# Patient Record
Sex: Male | Born: 1950 | Race: White | Hispanic: No | Marital: Married | Smoking: Never smoker
Health system: Southern US, Community
[De-identification: ages and names within clinical notes are randomized; demographics above are authoritative.]

## PROBLEM LIST (undated history)

## (undated) DIAGNOSIS — K8309 Other cholangitis: Secondary | ICD-10-CM

## (undated) DIAGNOSIS — M359 Systemic involvement of connective tissue, unspecified: Secondary | ICD-10-CM

## (undated) DIAGNOSIS — K219 Gastro-esophageal reflux disease without esophagitis: Secondary | ICD-10-CM

## (undated) DIAGNOSIS — R600 Localized edema: Secondary | ICD-10-CM

## (undated) DIAGNOSIS — N183 Chronic kidney disease, stage 3 unspecified: Secondary | ICD-10-CM

## (undated) DIAGNOSIS — G4733 Obstructive sleep apnea (adult) (pediatric): Secondary | ICD-10-CM

## (undated) DIAGNOSIS — E119 Type 2 diabetes mellitus without complications: Secondary | ICD-10-CM

## (undated) DIAGNOSIS — D649 Anemia, unspecified: Secondary | ICD-10-CM

## (undated) DIAGNOSIS — I700 Atherosclerosis of aorta: Secondary | ICD-10-CM

## (undated) DIAGNOSIS — R42 Dizziness and giddiness: Secondary | ICD-10-CM

## (undated) DIAGNOSIS — G459 Transient cerebral ischemic attack, unspecified: Secondary | ICD-10-CM

## (undated) DIAGNOSIS — E785 Hyperlipidemia, unspecified: Secondary | ICD-10-CM

## (undated) DIAGNOSIS — M1990 Unspecified osteoarthritis, unspecified site: Secondary | ICD-10-CM

## (undated) DIAGNOSIS — M109 Gout, unspecified: Secondary | ICD-10-CM

## (undated) DIAGNOSIS — I219 Acute myocardial infarction, unspecified: Secondary | ICD-10-CM

## (undated) DIAGNOSIS — Z8639 Personal history of other endocrine, nutritional and metabolic disease: Secondary | ICD-10-CM

## (undated) DIAGNOSIS — I2510 Atherosclerotic heart disease of native coronary artery without angina pectoris: Secondary | ICD-10-CM

## (undated) DIAGNOSIS — H9190 Unspecified hearing loss, unspecified ear: Secondary | ICD-10-CM

## (undated) DIAGNOSIS — K222 Esophageal obstruction: Secondary | ICD-10-CM

## (undated) DIAGNOSIS — E039 Hypothyroidism, unspecified: Secondary | ICD-10-CM

## (undated) DIAGNOSIS — I5030 Unspecified diastolic (congestive) heart failure: Secondary | ICD-10-CM

## (undated) DIAGNOSIS — I10 Essential (primary) hypertension: Secondary | ICD-10-CM

## (undated) DIAGNOSIS — R918 Other nonspecific abnormal finding of lung field: Secondary | ICD-10-CM

## (undated) DIAGNOSIS — R160 Hepatomegaly, not elsewhere classified: Secondary | ICD-10-CM

## (undated) DIAGNOSIS — R609 Edema, unspecified: Secondary | ICD-10-CM

## (undated) DIAGNOSIS — R0609 Other forms of dyspnea: Secondary | ICD-10-CM

## (undated) DIAGNOSIS — M719 Bursopathy, unspecified: Secondary | ICD-10-CM

## (undated) DIAGNOSIS — M4316 Spondylolisthesis, lumbar region: Secondary | ICD-10-CM

## (undated) HISTORY — DX: Essential (primary) hypertension: I10

## (undated) HISTORY — PX: ANKLE FRACTURE SURGERY: SHX122

## (undated) HISTORY — PX: BACK SURGERY: SHX140

## (undated) HISTORY — PX: TOTAL THYROIDECTOMY: SHX2547

## (undated) HISTORY — DX: Hyperlipidemia, unspecified: E78.5

## (undated) HISTORY — PX: FRACTURE SURGERY: SHX138

## (undated) HISTORY — PX: CARPAL TUNNEL RELEASE: SHX101

## (undated) HISTORY — DX: Bursopathy, unspecified: M71.9

## (undated) HISTORY — DX: Unspecified osteoarthritis, unspecified site: M19.90

## (undated) HISTORY — DX: Type 2 diabetes mellitus without complications: E11.9

## (undated) HISTORY — PX: COLONOSCOPY WITH ESOPHAGOGASTRODUODENOSCOPY (EGD): SHX5779

## (undated) HISTORY — DX: Acute myocardial infarction, unspecified: I21.9

## (undated) HISTORY — DX: Other cholangitis: K83.09

---

## 2000-02-09 DIAGNOSIS — K8301 Primary sclerosing cholangitis: Secondary | ICD-10-CM

## 2000-02-09 HISTORY — DX: Primary sclerosing cholangitis: K83.01

## 2011-09-02 ENCOUNTER — Ambulatory Visit: Payer: Self-pay | Admitting: Internal Medicine

## 2011-09-09 ENCOUNTER — Ambulatory Visit: Payer: Self-pay | Admitting: Internal Medicine

## 2011-10-10 ENCOUNTER — Ambulatory Visit: Payer: Self-pay | Admitting: Internal Medicine

## 2012-09-11 ENCOUNTER — Ambulatory Visit: Payer: Self-pay | Admitting: Internal Medicine

## 2012-09-20 ENCOUNTER — Ambulatory Visit: Payer: Self-pay | Admitting: Gastroenterology

## 2012-10-12 ENCOUNTER — Ambulatory Visit: Payer: Self-pay | Admitting: Gastroenterology

## 2012-10-24 ENCOUNTER — Inpatient Hospital Stay: Payer: Self-pay | Admitting: Internal Medicine

## 2012-10-24 LAB — COMPREHENSIVE METABOLIC PANEL
Albumin: 3.3 g/dL — ABNORMAL LOW (ref 3.4–5.0)
Alkaline Phosphatase: 95 U/L (ref 50–136)
Anion Gap: 9 (ref 7–16)
BUN: 13 mg/dL (ref 7–18)
Bilirubin,Total: 1.2 mg/dL — ABNORMAL HIGH (ref 0.2–1.0)
Calcium, Total: 9.8 mg/dL (ref 8.5–10.1)
Chloride: 104 mmol/L (ref 98–107)
Co2: 24 mmol/L (ref 21–32)
Creatinine: 1.24 mg/dL (ref 0.60–1.30)
EGFR (African American): >60
EGFR (Non-African Amer.): >60
SGOT(AST): 54 U/L — ABNORMAL HIGH (ref 15–37)
SGPT (ALT): 60 U/L (ref 12–78)
Sodium: 137 mmol/L (ref 136–145)
Total Protein: 6.3 g/dL — ABNORMAL LOW (ref 6.4–8.2)

## 2012-10-24 LAB — CBC WITH DIFFERENTIAL/PLATELET
Eosinophil #: 0 10*3/uL (ref 0.0–0.7)
HCT: 36.8 % — ABNORMAL LOW (ref 40.0–52.0)
HGB: 12.5 g/dL — ABNORMAL LOW (ref 13.0–18.0)
Lymphocyte %: 3.5 %
MCHC: 34 g/dL (ref 32.0–36.0)
Monocyte #: 0.3 x10 3/mm (ref 0.2–1.0)
Monocyte %: 2.7 %
Neutrophil #: 10.9 10*3/uL — ABNORMAL HIGH (ref 1.4–6.5)
Neutrophil %: 93.4 %
RDW: 13.5 % (ref 11.5–14.5)

## 2012-10-24 LAB — URINALYSIS, COMPLETE
Blood: NEGATIVE
Glucose,UR: NEGATIVE mg/dL (ref 0–75)
Ketone: NEGATIVE
Ph: 6 (ref 4.5–8.0)
Protein: 25
Specific Gravity: 1.005 (ref 1.003–1.030)
Squamous Epithelial: <1

## 2012-10-25 DIAGNOSIS — R109 Unspecified abdominal pain: Secondary | ICD-10-CM

## 2012-10-25 LAB — COMPREHENSIVE METABOLIC PANEL
Albumin: 2.6 g/dL — ABNORMAL LOW (ref 3.4–5.0)
Alkaline Phosphatase: 75 U/L (ref 50–136)
Anion Gap: 6 — ABNORMAL LOW (ref 7–16)
BUN: 17 mg/dL (ref 7–18)
Bilirubin,Total: 0.6 mg/dL (ref 0.2–1.0)
Calcium, Total: 8.2 mg/dL — ABNORMAL LOW (ref 8.5–10.1)
Co2: 26 mmol/L (ref 21–32)
EGFR (African American): >60
Glucose: 128 mg/dL — ABNORMAL HIGH (ref 65–99)
Osmolality: 284 (ref 275–301)
Potassium: 3.8 mmol/L (ref 3.5–5.1)
SGOT(AST): 45 U/L — ABNORMAL HIGH (ref 15–37)
SGPT (ALT): 48 U/L (ref 12–78)
Total Protein: 5.3 g/dL — ABNORMAL LOW (ref 6.4–8.2)

## 2012-10-25 LAB — CBC WITH DIFFERENTIAL/PLATELET
Basophil #: 0 10*3/uL (ref 0.0–0.1)
Eosinophil #: 0 10*3/uL (ref 0.0–0.7)
Eosinophil %: 0.3 %
HCT: 31.8 % — ABNORMAL LOW (ref 40.0–52.0)
Lymphocyte #: 1.4 10*3/uL (ref 1.0–3.6)
Lymphocyte %: 11.8 %
MCH: 29.8 pg (ref 26.0–34.0)
MCV: 89 fL (ref 80–100)
Monocyte #: 1.1 x10 3/mm — ABNORMAL HIGH (ref 0.2–1.0)
Monocyte %: 9.8 %
Neutrophil #: 8.9 10*3/uL — ABNORMAL HIGH (ref 1.4–6.5)
Neutrophil %: 77.8 %
Platelet: 143 10*3/uL — ABNORMAL LOW (ref 150–440)
RDW: 13.6 % (ref 11.5–14.5)
WBC: 11.4 10*3/uL — ABNORMAL HIGH (ref 3.8–10.6)

## 2012-10-25 LAB — APTT: Activated PTT: 33.1 secs (ref 23.6–35.9)

## 2012-10-26 LAB — CBC WITH DIFFERENTIAL/PLATELET
Basophil #: 0 10*3/uL (ref 0.0–0.1)
Eosinophil %: 0.3 %
HCT: 31.8 % — ABNORMAL LOW (ref 40.0–52.0)
HGB: 10.7 g/dL — ABNORMAL LOW (ref 13.0–18.0)
Neutrophil #: 5.8 10*3/uL (ref 1.4–6.5)
Platelet: 116 10*3/uL — ABNORMAL LOW (ref 150–440)
RDW: 13.2 % (ref 11.5–14.5)
WBC: 6.8 10*3/uL (ref 3.8–10.6)

## 2012-10-26 LAB — COMPREHENSIVE METABOLIC PANEL
Albumin: 2.3 g/dL — ABNORMAL LOW (ref 3.4–5.0)
Alkaline Phosphatase: 93 U/L (ref 50–136)
BUN: 20 mg/dL — ABNORMAL HIGH (ref 7–18)
Calcium, Total: 8.4 mg/dL — ABNORMAL LOW (ref 8.5–10.1)
Chloride: 109 mmol/L — ABNORMAL HIGH (ref 98–107)
Creatinine: 1.35 mg/dL — ABNORMAL HIGH (ref 0.60–1.30)
EGFR (African American): >60
EGFR (Non-African Amer.): 55 — ABNORMAL LOW
Glucose: 129 mg/dL — ABNORMAL HIGH (ref 65–99)
Osmolality: 284 (ref 275–301)
Potassium: 3.9 mmol/L (ref 3.5–5.1)
SGOT(AST): 94 U/L — ABNORMAL HIGH (ref 15–37)
SGPT (ALT): 90 U/L — ABNORMAL HIGH (ref 12–78)
Sodium: 140 mmol/L (ref 136–145)
Total Protein: 5 g/dL — ABNORMAL LOW (ref 6.4–8.2)

## 2012-10-26 LAB — CULTURE, BLOOD (SINGLE)

## 2012-10-27 DIAGNOSIS — R000 Tachycardia, unspecified: Secondary | ICD-10-CM

## 2012-10-27 LAB — CBC WITH DIFFERENTIAL/PLATELET
Basophil #: 0 10*3/uL (ref 0.0–0.1)
Basophil %: 0.2 %
Eosinophil #: 0 10*3/uL (ref 0.0–0.7)
Eosinophil %: 0 %
HCT: 32.1 % — ABNORMAL LOW (ref 40.0–52.0)
HGB: 10.8 g/dL — ABNORMAL LOW (ref 13.0–18.0)
Lymphocyte #: 0.2 10*3/uL — ABNORMAL LOW (ref 1.0–3.6)
MCHC: 33.7 g/dL (ref 32.0–36.0)
MCV: 88 fL (ref 80–100)
Monocyte %: 8.5 %
Neutrophil %: 87.9 %
RDW: 13.4 % (ref 11.5–14.5)

## 2012-10-27 LAB — COMPREHENSIVE METABOLIC PANEL
Alkaline Phosphatase: 108 U/L (ref 50–136)
Anion Gap: 8 (ref 7–16)
BUN: 16 mg/dL (ref 7–18)
Bilirubin,Total: 1.7 mg/dL — ABNORMAL HIGH (ref 0.2–1.0)
Chloride: 108 mmol/L — ABNORMAL HIGH (ref 98–107)
Co2: 24 mmol/L (ref 21–32)
EGFR (African American): >60
Glucose: 119 mg/dL — ABNORMAL HIGH (ref 65–99)
Potassium: 3.7 mmol/L (ref 3.5–5.1)
SGPT (ALT): 73 U/L (ref 12–78)
Sodium: 140 mmol/L (ref 136–145)
Total Protein: 5.4 g/dL — ABNORMAL LOW (ref 6.4–8.2)

## 2012-10-28 LAB — CBC WITH DIFFERENTIAL/PLATELET
Basophil #: 0 10*3/uL (ref 0.0–0.1)
Eosinophil #: 0.1 10*3/uL (ref 0.0–0.7)
Eosinophil %: 1.4 %
HGB: 9.9 g/dL — ABNORMAL LOW (ref 13.0–18.0)
Lymphocyte #: 0.6 10*3/uL — ABNORMAL LOW (ref 1.0–3.6)
Lymphocyte %: 9.3 %
MCH: 29.7 pg (ref 26.0–34.0)
Monocyte #: 0.8 x10 3/mm (ref 0.2–1.0)
Monocyte %: 13 %
Neutrophil %: 76 %
Platelet: 102 10*3/uL — ABNORMAL LOW (ref 150–440)
RDW: 13.5 % (ref 11.5–14.5)

## 2012-10-28 LAB — URINALYSIS, COMPLETE
Bacteria: NONE SEEN
Glucose,UR: 50 mg/dL (ref 0–75)
Nitrite: NEGATIVE
Ph: 6 (ref 4.5–8.0)
Protein: 30
Specific Gravity: 1.023 (ref 1.003–1.030)
Squamous Epithelial: NONE SEEN

## 2012-10-28 LAB — COMPREHENSIVE METABOLIC PANEL
Alkaline Phosphatase: 80 U/L (ref 50–136)
Anion Gap: 6 — ABNORMAL LOW (ref 7–16)
Bilirubin,Total: 1.4 mg/dL — ABNORMAL HIGH (ref 0.2–1.0)
Chloride: 109 mmol/L — ABNORMAL HIGH (ref 98–107)
Co2: 25 mmol/L (ref 21–32)
Creatinine: 1.16 mg/dL (ref 0.60–1.30)
EGFR (African American): >60
EGFR (Non-African Amer.): >60
Glucose: 116 mg/dL — ABNORMAL HIGH (ref 65–99)
Osmolality: 281 (ref 275–301)
SGOT(AST): 32 U/L (ref 15–37)
SGPT (ALT): 49 U/L (ref 12–78)
Total Protein: 4.9 g/dL — ABNORMAL LOW (ref 6.4–8.2)

## 2012-10-29 LAB — POTASSIUM: Potassium: 3.9 mmol/L (ref 3.5–5.1)

## 2012-10-30 ENCOUNTER — Encounter: Payer: Self-pay | Admitting: General Surgery

## 2012-10-30 LAB — CBC WITH DIFFERENTIAL/PLATELET
Basophil #: 0.1 10*3/uL (ref 0.0–0.1)
Basophil %: 0.9 %
HCT: 31.1 % — ABNORMAL LOW (ref 40.0–52.0)
HGB: 10.4 g/dL — ABNORMAL LOW (ref 13.0–18.0)
MCH: 29.4 pg (ref 26.0–34.0)
MCHC: 33.5 g/dL (ref 32.0–36.0)
MCV: 88 fL (ref 80–100)
Monocyte #: 1.8 x10 3/mm — ABNORMAL HIGH (ref 0.2–1.0)
Monocyte %: 24.2 %
Neutrophil #: 4 10*3/uL (ref 1.4–6.5)
Platelet: 173 10*3/uL (ref 150–440)
RBC: 3.55 10*6/uL — ABNORMAL LOW (ref 4.40–5.90)
RDW: 13.8 % (ref 11.5–14.5)

## 2012-10-30 LAB — HEPATIC FUNCTION PANEL A (ARMC)
Albumin: 2.2 g/dL — ABNORMAL LOW (ref 3.4–5.0)
Bilirubin, Direct: 0.3 mg/dL — ABNORMAL HIGH (ref 0.00–0.20)
Bilirubin,Total: 0.7 mg/dL (ref 0.2–1.0)

## 2012-10-30 LAB — VANCOMYCIN, TROUGH: Vancomycin, Trough: 7 ug/mL — ABNORMAL LOW (ref 10–20)

## 2012-10-31 ENCOUNTER — Encounter: Payer: Self-pay | Admitting: General Surgery

## 2012-11-01 LAB — BODY FLUID CULTURE

## 2012-11-01 LAB — CULTURE, BLOOD (SINGLE)

## 2012-11-02 ENCOUNTER — Encounter: Payer: Self-pay | Admitting: General Surgery

## 2012-11-11 ENCOUNTER — Inpatient Hospital Stay: Payer: Self-pay | Admitting: Internal Medicine

## 2012-11-11 LAB — URINALYSIS, COMPLETE
Bacteria: NONE SEEN
Blood: NEGATIVE
Glucose,UR: NEGATIVE mg/dL (ref 0–75)
Ketone: NEGATIVE
Leukocyte Esterase: NEGATIVE
Nitrite: NEGATIVE
Protein: 100
RBC,UR: 1 /HPF (ref 0–5)
Squamous Epithelial: NONE SEEN
WBC UR: 1 /HPF (ref 0–5)

## 2012-11-11 LAB — COMPREHENSIVE METABOLIC PANEL
Alkaline Phosphatase: 176 U/L — ABNORMAL HIGH (ref 50–136)
Bilirubin,Total: 2 mg/dL — ABNORMAL HIGH (ref 0.2–1.0)
Calcium, Total: 9.9 mg/dL (ref 8.5–10.1)
Co2: 25 mmol/L (ref 21–32)
Creatinine: 1.4 mg/dL — ABNORMAL HIGH (ref 0.60–1.30)
EGFR (African American): >60
Glucose: 159 mg/dL — ABNORMAL HIGH (ref 65–99)
Osmolality: 290 (ref 275–301)
SGPT (ALT): 216 U/L — ABNORMAL HIGH (ref 12–78)
Total Protein: 7.4 g/dL (ref 6.4–8.2)

## 2012-11-11 LAB — CBC
HCT: 38.4 % — ABNORMAL LOW (ref 40.0–52.0)
HGB: 12.7 g/dL — ABNORMAL LOW (ref 13.0–18.0)
MCH: 28.6 pg (ref 26.0–34.0)
MCHC: 33.1 g/dL (ref 32.0–36.0)
MCV: 87 fL (ref 80–100)
RDW: 14.2 % (ref 11.5–14.5)

## 2012-11-12 LAB — CBC WITH DIFFERENTIAL/PLATELET
Basophil %: 1.1 %
Eosinophil #: 0.1 10*3/uL (ref 0.0–0.7)
Eosinophil %: 1.9 %
HCT: 35 % — ABNORMAL LOW (ref 40.0–52.0)
Lymphocyte #: 1.4 10*3/uL (ref 1.0–3.6)
MCHC: 33.3 g/dL (ref 32.0–36.0)
Neutrophil #: 4.4 10*3/uL (ref 1.4–6.5)
Neutrophil %: 64.9 %
RBC: 4.07 10*6/uL — ABNORMAL LOW (ref 4.40–5.90)

## 2012-11-12 LAB — COMPREHENSIVE METABOLIC PANEL
Albumin: 3.2 g/dL — ABNORMAL LOW (ref 3.4–5.0)
Anion Gap: 7 (ref 7–16)
Chloride: 109 mmol/L — ABNORMAL HIGH (ref 98–107)
Creatinine: 1.16 mg/dL (ref 0.60–1.30)
EGFR (Non-African Amer.): >60
Glucose: 106 mg/dL — ABNORMAL HIGH (ref 65–99)
Osmolality: 283 (ref 275–301)
SGPT (ALT): 238 U/L — ABNORMAL HIGH (ref 12–78)
Total Protein: 6.3 g/dL — ABNORMAL LOW (ref 6.4–8.2)

## 2012-11-13 ENCOUNTER — Telehealth: Payer: Self-pay | Admitting: General Surgery

## 2012-11-13 DIAGNOSIS — K8309 Other cholangitis: Secondary | ICD-10-CM

## 2012-11-13 LAB — CBC WITH DIFFERENTIAL/PLATELET
Eosinophil #: 0.2 10*3/uL (ref 0.0–0.7)
Eosinophil %: 2.8 %
HGB: 11.6 g/dL — ABNORMAL LOW (ref 13.0–18.0)
Lymphocyte #: 1.4 10*3/uL (ref 1.0–3.6)
MCHC: 33 g/dL (ref 32.0–36.0)
MCV: 86 fL (ref 80–100)
Monocyte #: 0.6 x10 3/mm (ref 0.2–1.0)
Neutrophil #: 3.5 10*3/uL (ref 1.4–6.5)
Platelet: 221 10*3/uL (ref 150–440)
RBC: 4.07 10*6/uL — ABNORMAL LOW (ref 4.40–5.90)
RDW: 14.1 % (ref 11.5–14.5)

## 2012-11-13 LAB — BILIRUBIN, DIRECT: Bilirubin, Direct: 2.5 mg/dL — ABNORMAL HIGH (ref 0.00–0.20)

## 2012-11-13 LAB — COMPREHENSIVE METABOLIC PANEL
Albumin: 2.9 g/dL — ABNORMAL LOW (ref 3.4–5.0)
Anion Gap: 6 — ABNORMAL LOW (ref 7–16)
Bilirubin,Total: 3.6 mg/dL — ABNORMAL HIGH (ref 0.2–1.0)
Calcium, Total: 8.9 mg/dL (ref 8.5–10.1)
Chloride: 109 mmol/L — ABNORMAL HIGH (ref 98–107)
Co2: 24 mmol/L (ref 21–32)
EGFR (Non-African Amer.): >60
Glucose: 103 mg/dL — ABNORMAL HIGH (ref 65–99)
Osmolality: 276 (ref 275–301)
SGPT (ALT): 182 U/L — ABNORMAL HIGH (ref 12–78)

## 2012-11-13 NOTE — Telephone Encounter (Signed)
JUNE CALLED REGARDING PT & WOULD LIKE TO SPEAK WITH YOU.

## 2012-11-14 LAB — HEPATIC FUNCTION PANEL A (ARMC)
Albumin: 2.9 g/dL — ABNORMAL LOW (ref 3.4–5.0)
Alkaline Phosphatase: 200 U/L — ABNORMAL HIGH (ref 50–136)
Bilirubin, Direct: 0.6 mg/dL — ABNORMAL HIGH (ref 0.00–0.20)
Bilirubin,Total: 1.1 mg/dL — ABNORMAL HIGH (ref 0.2–1.0)
SGOT(AST): 101 U/L — ABNORMAL HIGH (ref 15–37)
SGPT (ALT): 130 U/L — ABNORMAL HIGH (ref 12–78)
Total Protein: 6 g/dL — ABNORMAL LOW (ref 6.4–8.2)

## 2012-11-14 LAB — HEMATOCRIT: HCT: 34.9 % — ABNORMAL LOW (ref 40.0–52.0)

## 2012-11-28 ENCOUNTER — Inpatient Hospital Stay: Payer: Self-pay | Admitting: Internal Medicine

## 2012-11-28 LAB — CBC WITH DIFFERENTIAL/PLATELET
Eosinophil #: 0 10*3/uL (ref 0.0–0.7)
Lymphocyte #: 0.5 10*3/uL — ABNORMAL LOW (ref 1.0–3.6)
MCHC: 33.6 g/dL (ref 32.0–36.0)
Monocyte #: 0.2 x10 3/mm (ref 0.2–1.0)
Monocyte %: 1.4 %
Neutrophil %: 94.7 %
Platelet: 166 10*3/uL (ref 150–440)
RBC: 4.68 10*6/uL (ref 4.40–5.90)
WBC: 14 10*3/uL — ABNORMAL HIGH (ref 3.8–10.6)

## 2012-11-28 LAB — COMPREHENSIVE METABOLIC PANEL WITH GFR
Albumin: 3.7 g/dL (ref 3.4–5.0)
Alkaline Phosphatase: 211 U/L — ABNORMAL HIGH (ref 50–136)
Anion Gap: 6 — ABNORMAL LOW (ref 7–16)
BUN: 17 mg/dL (ref 7–18)
Bilirubin,Total: 4 mg/dL — ABNORMAL HIGH (ref 0.2–1.0)
Calcium, Total: 9.8 mg/dL (ref 8.5–10.1)
Chloride: 106 mmol/L (ref 98–107)
Co2: 27 mmol/L (ref 21–32)
Creatinine: 1.28 mg/dL (ref 0.60–1.30)
EGFR (African American): >60
EGFR (Non-African Amer.): 59 — ABNORMAL LOW
Glucose: 142 mg/dL — ABNORMAL HIGH (ref 65–99)
Osmolality: 282 (ref 275–301)
Potassium: 3.7 mmol/L (ref 3.5–5.1)
SGOT(AST): 238 U/L — ABNORMAL HIGH (ref 15–37)
SGPT (ALT): 189 U/L — ABNORMAL HIGH (ref 12–78)
Sodium: 139 mmol/L (ref 136–145)
Total Protein: 7.5 g/dL (ref 6.4–8.2)

## 2012-11-29 LAB — CBC WITH DIFFERENTIAL/PLATELET
Basophil %: 0.4 %
Eosinophil #: 0 10*3/uL (ref 0.0–0.7)
Eosinophil %: 0.1 %
HCT: 34.9 % — ABNORMAL LOW (ref 40.0–52.0)
HGB: 11.7 g/dL — ABNORMAL LOW (ref 13.0–18.0)
Lymphocyte #: 0.7 10*3/uL — ABNORMAL LOW (ref 1.0–3.6)
Monocyte %: 10.1 %
Neutrophil #: 9.2 10*3/uL — ABNORMAL HIGH (ref 1.4–6.5)
Neutrophil %: 83.1 %
WBC: 11.1 10*3/uL — ABNORMAL HIGH (ref 3.8–10.6)

## 2012-11-29 LAB — URINALYSIS, COMPLETE
Bacteria: NONE SEEN
Blood: NEGATIVE
Ketone: NEGATIVE
Nitrite: NEGATIVE
Ph: 6 (ref 4.5–8.0)
Protein: 30

## 2012-11-29 LAB — HEPATIC FUNCTION PANEL A (ARMC)
Alkaline Phosphatase: 163 U/L — ABNORMAL HIGH (ref 50–136)
Bilirubin, Direct: 2.4 mg/dL — ABNORMAL HIGH (ref 0.00–0.20)
SGOT(AST): 137 U/L — ABNORMAL HIGH (ref 15–37)
SGPT (ALT): 137 U/L — ABNORMAL HIGH (ref 12–78)

## 2012-11-29 LAB — BASIC METABOLIC PANEL
Creatinine: 1.42 mg/dL — ABNORMAL HIGH (ref 0.60–1.30)
EGFR (African American): >60
EGFR (Non-African Amer.): 52 — ABNORMAL LOW
Potassium: 3.8 mmol/L (ref 3.5–5.1)

## 2012-11-30 LAB — CBC WITH DIFFERENTIAL/PLATELET
Basophil #: 0 10*3/uL (ref 0.0–0.1)
Basophil %: 0.5 %
Basophil: 1 %
HCT: 33.1 % — ABNORMAL LOW (ref 40.0–52.0)
HGB: 11.1 g/dL — ABNORMAL LOW (ref 13.0–18.0)
Lymphocyte #: 1.6 10*3/uL (ref 1.0–3.6)
Lymphocyte %: 21.4 %
Lymphocytes: 16 %
MCH: 27.9 pg (ref 26.0–34.0)
MCHC: 33.5 g/dL (ref 32.0–36.0)
Monocyte #: 0.9 x10 3/mm (ref 0.2–1.0)
Monocyte %: 12.5 %
RBC: 3.99 10*6/uL — ABNORMAL LOW (ref 4.40–5.90)
RDW: 14.2 % (ref 11.5–14.5)
WBC: 7.5 10*3/uL (ref 3.8–10.6)

## 2012-11-30 LAB — COMPREHENSIVE METABOLIC PANEL
Albumin: 2.7 g/dL — ABNORMAL LOW (ref 3.4–5.0)
Anion Gap: 5 — ABNORMAL LOW (ref 7–16)
BUN: 15 mg/dL (ref 7–18)
Bilirubin,Total: 1.3 mg/dL — ABNORMAL HIGH (ref 0.2–1.0)
Calcium, Total: 9 mg/dL (ref 8.5–10.1)
Co2: 25 mmol/L (ref 21–32)
Creatinine: 1.22 mg/dL (ref 0.60–1.30)
Osmolality: 282 (ref 275–301)
Potassium: 4.3 mmol/L (ref 3.5–5.1)
SGOT(AST): 76 U/L — ABNORMAL HIGH (ref 15–37)
Total Protein: 6 g/dL — ABNORMAL LOW (ref 6.4–8.2)

## 2012-12-01 LAB — CBC WITH DIFFERENTIAL/PLATELET
Basophil #: 0.1 10*3/uL (ref 0.0–0.1)
Basophil %: 0.7 %
Eosinophil %: 2.1 %
HCT: 33.7 % — ABNORMAL LOW (ref 40.0–52.0)
HGB: 11.3 g/dL — ABNORMAL LOW (ref 13.0–18.0)
Lymphocyte #: 1.5 10*3/uL (ref 1.0–3.6)
MCH: 27.7 pg (ref 26.0–34.0)
MCHC: 33.4 g/dL (ref 32.0–36.0)
Monocyte #: 0.6 x10 3/mm (ref 0.2–1.0)
Monocyte %: 9.1 %
Neutrophil #: 4.4 10*3/uL (ref 1.4–6.5)
Platelet: 118 10*3/uL — ABNORMAL LOW (ref 150–440)
RDW: 13.9 % (ref 11.5–14.5)
WBC: 6.8 10*3/uL (ref 3.8–10.6)

## 2012-12-01 LAB — COMPREHENSIVE METABOLIC PANEL
Albumin: 2.8 g/dL — ABNORMAL LOW (ref 3.4–5.0)
Alkaline Phosphatase: 151 U/L — ABNORMAL HIGH (ref 50–136)
Anion Gap: 7 (ref 7–16)
Bilirubin,Total: 1 mg/dL (ref 0.2–1.0)
Chloride: 110 mmol/L — ABNORMAL HIGH (ref 98–107)
EGFR (Non-African Amer.): >60
Glucose: 85 mg/dL (ref 65–99)
SGPT (ALT): 76 U/L (ref 12–78)
Sodium: 140 mmol/L (ref 136–145)
Total Protein: 6.1 g/dL — ABNORMAL LOW (ref 6.4–8.2)

## 2012-12-01 LAB — MAGNESIUM: Magnesium: 1.7 mg/dL — ABNORMAL LOW

## 2012-12-02 LAB — HEPATIC FUNCTION PANEL A (ARMC)
Albumin: 3.3 g/dL — ABNORMAL LOW (ref 3.4–5.0)
Alkaline Phosphatase: 244 U/L — ABNORMAL HIGH (ref 50–136)
Bilirubin, Direct: 0.6 mg/dL — ABNORMAL HIGH (ref 0.00–0.20)
Bilirubin,Total: 1 mg/dL (ref 0.2–1.0)
SGOT(AST): 111 U/L — ABNORMAL HIGH (ref 15–37)

## 2012-12-02 LAB — CBC WITH DIFFERENTIAL/PLATELET
Basophil #: 0.1 10*3/uL (ref 0.0–0.1)
Basophil %: 0.8 %
Eosinophil #: 0.2 10*3/uL (ref 0.0–0.7)
Eosinophil %: 3.1 %
HCT: 39.3 % — ABNORMAL LOW (ref 40.0–52.0)
HGB: 13.2 g/dL (ref 13.0–18.0)
Lymphocyte #: 1.4 10*3/uL (ref 1.0–3.6)
Lymphocyte %: 18.3 %
MCHC: 33.5 g/dL (ref 32.0–36.0)
MCV: 83 fL (ref 80–100)
Monocyte #: 0.5 x10 3/mm (ref 0.2–1.0)
Monocyte %: 7.1 %
Neutrophil #: 5.4 10*3/uL (ref 1.4–6.5)
RBC: 4.75 10*6/uL (ref 4.40–5.90)
RDW: 14.3 % (ref 11.5–14.5)

## 2012-12-03 LAB — HEPATIC FUNCTION PANEL A (ARMC)
Bilirubin, Direct: 0.5 mg/dL — ABNORMAL HIGH (ref 0.00–0.20)
SGPT (ALT): 81 U/L — ABNORMAL HIGH (ref 12–78)
Total Protein: 7 g/dL (ref 6.4–8.2)

## 2012-12-03 LAB — CBC WITH DIFFERENTIAL/PLATELET
Basophil #: 0.1 10*3/uL (ref 0.0–0.1)
Basophil %: 0.9 %
Eosinophil #: 0.4 10*3/uL (ref 0.0–0.7)
Eosinophil %: 4.1 %
HGB: 13.1 g/dL (ref 13.0–18.0)
Lymphocyte #: 2.6 10*3/uL (ref 1.0–3.6)
Lymphocyte %: 28.2 %
MCH: 27.1 pg (ref 26.0–34.0)
MCV: 82 fL (ref 80–100)
Monocyte %: 8.4 %
Neutrophil #: 5.3 10*3/uL (ref 1.4–6.5)
Neutrophil %: 58.4 %
Platelet: 212 10*3/uL (ref 150–440)
RDW: 14.4 % (ref 11.5–14.5)

## 2012-12-03 LAB — CULTURE, BLOOD (SINGLE)

## 2013-02-08 HISTORY — PX: BILE DUCT STENT PLACEMENT: SHX1227

## 2013-03-11 DIAGNOSIS — K8309 Other cholangitis: Secondary | ICD-10-CM

## 2013-03-11 HISTORY — DX: Other cholangitis: K83.09

## 2014-02-18 ENCOUNTER — Emergency Department: Payer: Self-pay | Admitting: Emergency Medicine

## 2014-02-18 LAB — URINALYSIS, COMPLETE
BILIRUBIN, UR: NEGATIVE
Bacteria: NONE SEEN
Blood: NEGATIVE
Glucose,UR: 50 mg/dL (ref 0–75)
LEUKOCYTE ESTERASE: NEGATIVE
Nitrite: NEGATIVE
Ph: 5 (ref 4.5–8.0)
Protein: 30
SQUAMOUS EPITHELIAL: NONE SEEN
Specific Gravity: 1.018 (ref 1.003–1.030)
WBC UR: <1 /HPF (ref 0–5)

## 2014-02-18 LAB — COMPREHENSIVE METABOLIC PANEL
ALK PHOS: 103 U/L
Albumin: 3.5 g/dL (ref 3.4–5.0)
Anion Gap: 11 (ref 7–16)
BILIRUBIN TOTAL: 0.6 mg/dL (ref 0.2–1.0)
BUN: 20 mg/dL — ABNORMAL HIGH (ref 7–18)
CO2: 24 mmol/L (ref 21–32)
Calcium, Total: 9.7 mg/dL (ref 8.5–10.1)
Chloride: 105 mmol/L (ref 98–107)
Creatinine: 1.53 mg/dL — ABNORMAL HIGH (ref 0.60–1.30)
EGFR (African American): 59 — ABNORMAL LOW
EGFR (Non-African Amer.): 49 — ABNORMAL LOW
Glucose: 209 mg/dL — ABNORMAL HIGH (ref 65–99)
Osmolality: 288 (ref 275–301)
Potassium: 3.5 mmol/L (ref 3.5–5.1)
SGOT(AST): 57 U/L — ABNORMAL HIGH (ref 15–37)
SGPT (ALT): 68 U/L — ABNORMAL HIGH
SODIUM: 140 mmol/L (ref 136–145)
TOTAL PROTEIN: 6.7 g/dL (ref 6.4–8.2)

## 2014-02-18 LAB — CBC
HCT: 40.9 % (ref 40.0–52.0)
HGB: 12.8 g/dL — AB (ref 13.0–18.0)
MCH: 26.3 pg (ref 26.0–34.0)
MCHC: 31.3 g/dL — AB (ref 32.0–36.0)
MCV: 84 fL (ref 80–100)
Platelet: 141 10*3/uL — ABNORMAL LOW (ref 150–440)
RBC: 4.87 10*6/uL (ref 4.40–5.90)
RDW: 14.8 % — ABNORMAL HIGH (ref 11.5–14.5)
WBC: 8.1 10*3/uL (ref 3.8–10.6)

## 2014-02-23 LAB — CULTURE, BLOOD (SINGLE)

## 2014-05-31 NOTE — Consult Note (Signed)
Chief Complaint:  Subjective/Chief Complaint Patienty reports 8/10 pain last night with 4/ 10 today. Says it is in the lower abd on the right. no nausea or vomiting. Had BM yesterday. Food makes pain a little worse.   VITAL SIGNS/ANCILLARY NOTES: **Vital Signs.:   25-Oct-14 07:20  Vital Signs Type Q4  Temperature Temperature (F) 98.4  Celsius 36.8  Temperature Source oral  Pulse Pulse 40  Pulse source if not from Vital Sign Device per cardiac monitor  Respirations Respirations 18  Systolic BP Systolic BP 450  Diastolic BP (mmHg) Diastolic BP (mmHg) 82  Mean BP 108  BP Source  if not from Vital Sign Device manual  Pulse Ox % Pulse Ox % 98  Pulse Ox Activity Level  At rest  Oxygen Delivery Room Air/ 21 %  Pulse Ox Heart Rate 44   Brief Assessment:  GEN no acute distress   Cardiac Regular   Respiratory normal resp effort   Gastrointestinal details normal Soft  Nontender   Assessment/Plan:  Assessment/Plan:  Assessment PSC   Plan The patient had some abd pain yesterday with less today. Will check CBC and LFT's to make sure stent is draining. If lobs stable may go home and follwo up as out patient with Dr. Vira Agar.   Electronic Signatures: Lucilla Lame (MD)  (Signed 25-Oct-14 10:04)  Authored: Chief Complaint, VITAL SIGNS/ANCILLARY NOTES, Brief Assessment, Assessment/Plan   Last Updated: 25-Oct-14 10:04 by Lucilla Lame (MD)

## 2014-05-31 NOTE — Consult Note (Signed)
Chief Complaint:  Subjective/Chief Complaint Feeling better this AM. No fever today. Percutaneous drain in place. LFT coming down.   VITAL SIGNS/ANCILLARY NOTES: **Vital Signs.:   19-Sep-14 07:36  Vital Signs Type Q 4hr  Temperature Temperature (F) 98.8  Celsius 37.1  Pulse Pulse 61  Respirations Respirations 20  Systolic BP Systolic BP 859  Diastolic BP (mmHg) Diastolic BP (mmHg) 71  Mean BP 88  Pulse Ox % Pulse Ox % 95  Pulse Ox Activity Level  At rest  Oxygen Delivery 2L   Brief Assessment:  GEN no acute distress   Cardiac Regular   Respiratory clear BS   Gastrointestinal Normal   Lab Results: Hepatic:  19-Sep-14 04:28   Bilirubin, Total  1.7  Alkaline Phosphatase 108  SGPT (ALT) 73  SGOT (AST)  54  Total Protein, Serum  5.4  Albumin, Serum  2.4  Routine Chem:  19-Sep-14 04:28   Glucose, Serum  119  BUN 16  Creatinine (comp) 1.25  Sodium, Serum 140  Potassium, Serum 3.7  Chloride, Serum  108  CO2, Serum 24  Calcium (Total), Serum  8.4  Osmolality (calc) 282  eGFR (African American) >60  eGFR (Non-African American) >60 (eGFR values <46mL/min/1.73 m2 may be an indication of chronic kidney disease (CKD). Calculated eGFR is useful in patients with stable renal function. The eGFR calculation will not be reliable in acutely ill patients when serum creatinine is changing rapidly. It is not useful in  patients on dialysis. The eGFR calculation may not be applicable to patients at the low and high extremes of body sizes, pregnant women, and vegetarians.)  Anion Gap 8  Routine Hem:  19-Sep-14 04:28   WBC (CBC) 6.6  RBC (CBC)  3.66  Hemoglobin (CBC)  10.8  Hematocrit (CBC)  32.1  Platelet Count (CBC)  112  MCV 88  MCH 29.5  MCHC 33.7  RDW 13.4  Neutrophil % 87.9  Lymphocyte % 3.4  Monocyte % 8.5  Eosinophil % 0.0  Basophil % 0.2  Neutrophil # 5.8  Lymphocyte #  0.2  Monocyte # 0.6  Eosinophil # 0.0  Basophil # 0.0 (Result(s) reported on 27 Oct 2012 at 05:16AM.)   Radiology Results: CT:    18-Sep-14 10:38, CT Guide for Abscess Drainage (Specify)  CT Guide for Abscess Drainage (Specify)   REASON FOR EXAM:    gall bladder drainage  COMMENTS:       PROCEDURE: CT  - CT GUIDED ABSCESS DRAINAGE  - Oct 26 2012 10:38AM     RESULT: CT-Guided Percutaneous cholecystostomy tube    Indication: [place] emphysematous cholecystitis. Recent ERCP 2 weeks   ago.    Comparisons: None    Procedure:     Clinical assessment was performed and informed consent obtained.  The     patient was brought to the CT suite and placed supine on the table. A   focused abdomen CT without contrast was performed.     There is air present within the gallbladder. The gallbladder was deemed   amenable to drainage.    The right upper quadrant was prepped and draped in the usual sterile   fashion. The overlying skin was anesthetized with 1% lidocaine. A 21   gauge micropuncture needle was inserted and the location confirmed by CT   fluoroscopy. The needle was advanced and placement within the collection   confirmed by CT and syringe aspiration of purulent material which was   sent for gram stain and culture.  The 21 gauge needle was exchanged for a 5 [place] dilator over a wire. The   dilator was then exchanged for an 8 [place] transitional dilator followed     by placement of an 8.5 [place] APDL pigtail catheter into the gallbladder.   The catheter was secured to the skin using a StatLock device, dressed,   and connected to a drainage bag.    The procedure was well tolerated and without complication. Hemostasis was   achieved. The patient was transferred to the recovery unit in stable   condition.    Sedation: 1.5 mg of midazolam and 50 mcg of fentanyl.    IMPRESSION:     Successful CT-guided placement of an 8.5 [place] APDL pigtail catheter   into the gallbladder.  Dictation Site: 1        Verified By: Jennette Banker, M.D., MD    Assessment/Plan:  Assessment/Plan:  Assessment PSC with cholangitis. Percutaneous drainage in place.   Plan Await for bile culture and sensitivities for appropriate antibiotic coverage. ERCP with stenting will be considered only if LFT remain high with persistent fever. Preferential stenting to right intrahepatics may be difficult. Hopefully, percutaneous GB drainage will be adequate. Dr. Allen Norris will cover this weekend. Thanks.   Electronic Signatures: Verdie Shire (MD)  (Signed 19-Sep-14 08:54)  Authored: Chief Complaint, VITAL SIGNS/ANCILLARY NOTES, Brief Assessment, Lab Results, Radiology Results, Assessment/Plan   Last Updated: 19-Sep-14 08:54 by Verdie Shire (MD)

## 2014-05-31 NOTE — Consult Note (Signed)
Pt seen and examined. Please see Dawn Harrison's notes. Pt with diffuse abd pain post ERCP 2 wks ago. Started having nausea, vomiting, and fever starting on Sunday. ERCP consistent with PSC. No dominant stricture present in CBD. Also, LFT normal. Good bile drainage post small sphinterotomy. Also, given prophylactic Abx. Thus, no stent placed. Now abd minimally tender post pain meds. LFT relatively normal again. Air in [employer] but possible post sphinterotomy. Small perforation possible with sphinterotomy but no evidence of free air or abscess on CT.Agree with GB U/S. Agree with surgical input. Continue IV Abx. Can have ice chips post U/S. If GB is not the source of sepsis, then can consider ERCP tomorrow or Friday, though not sure if stenting will really help or not. Will follow. Thanks    Electronic Signatures: Verdie Shire (MD) (Signed on 17-Sep-14 15:12)  Authored   Last Updated: 17-Sep-14 16:03 by Verdie Shire (MD)

## 2014-05-31 NOTE — Consult Note (Signed)
Long discussion with patient. Pt feels better today after Abx started. It seems cholangitis recurs once Abx stopped. LFT abnormal. Appears bile not draining properly from [facility]. Thus, stenting is necessary at this point. Unfortunatly, patient did not have a dominant stricture when he had his last ERCP. So, one of the intrahepatics may not be draining adequately. Though, one would expect LFT to remain normal if the other duct system is draining. Will have to drain the one system that is not draining. May be tricky to get the guidewire up the appropriate duct to place the stent. Pt may need to be committed to long term stenting since [place] appears to be progressing. Recommend patient to go back to [facility] once patient recovers from current event so his [place] can be managed long term. THanks.  Electronic Signatures: Verdie Shire (MD)  (Signed on 22-Oct-14 12:04)  Authored  Last Updated: 22-Oct-14 12:04 by Verdie Shire (MD)

## 2014-05-31 NOTE — Consult Note (Signed)
Brief Consult Note: Diagnosis: Generalized abdominal pain. Fever.  Abnormal GI xray findings.  Known history of [place].   Comments: Patient's presentation discussed with Dr. Verdie Shire.  Will proceed with abdominal ultrasound STAT to evaluate for evidence of gallstones, concern with cholecystitis.  Fever of 103.0.  s/p ERCP.  Will speak with Dr. Terri Piedra, surgeon of choice by patient and his wife.  Will continue to monitor. NPO status.  Order placed for Tylenol 650 mg supp every six hours as needed fever greater than 100.5.  Electronic Signatures: Payton Emerald (NP)  (Signed 17-Sep-14 14:35)  Authored: Brief Consult Note   Last Updated: 17-Sep-14 14:35 by Payton Emerald (NP)

## 2014-05-31 NOTE — Consult Note (Signed)
Chief Complaint:  Subjective/Chief Complaint Obstructive jaundice. patient reports feeling better today. No labs back this am. Drainage bag with bile in it.   VITAL SIGNS/ANCILLARY NOTES: **Vital Signs.:   21-Sep-14 08:32  Vital Signs Type Q 4hr  Temperature Temperature (F) 98  Celsius 36.6  Temperature Source oral  Pulse Pulse 53  Respirations Respirations 20  Systolic BP Systolic BP 412  Diastolic BP (mmHg) Diastolic BP (mmHg) 79  Mean BP 97  Pulse Ox % Pulse Ox % 96  Pulse Ox Activity Level  At rest  Oxygen Delivery Room Air/ 21 %   Brief Assessment:  GEN well developed, well nourished, no acute distress   Respiratory normal resp effort  no use of accessory muscles   Additional Physical Exam Alert and orientated times 3   Lab Results: Routine Chem:  21-Sep-14 04:14   Potassium, Serum 3.9 (Result(s) reported on 29 Oct 2012 at 05:00AM.)   Assessment/Plan:  Assessment/Plan:  Assessment PSC with obstruction and external drainage in place.   Plan The patient reports doing well today. Will recheck LFT's and CBC in the am. Dr. Candace Cruise to resume care tomorrow.   Electronic Signatures: Lucilla Lame (MD)  (Signed 21-Sep-14 10:22)  Authored: Chief Complaint, VITAL SIGNS/ANCILLARY NOTES, Brief Assessment, Lab Results, Assessment/Plan   Last Updated: 21-Sep-14 10:22 by Lucilla Lame (MD)

## 2014-05-31 NOTE — Consult Note (Signed)
PATIENT NAME:  Jesse Thompson, Jesse Thompson MR#:  [id number] DATE OF BIRTH:  17-Feb-1950  DATE OF CONSULTATION:  11/28/2012  REFERRING PHYSICIAN:   CONSULTING PHYSICIAN:  Manya Silvas, MD  HISTORY OF PRESENT ILLNESS:  The patient is a 64 year old white male with a history of primary sclerosing cholangitis, recently had 2 admissions for cholangitis. On initial admission had Klebsiella, had percutaneous cholecystostomy drainage because of biliary sepsis. That was his first hospitalization recently. Second hospitalization he responded to IV fluids and antibiotics. The patient last night began having fever and chills and had vomiting twice and because of this and low-grade temperature elevation saw Dr. Caryl Comes and was admitted to the hospital. I was asked to see him in consultation. The patient's wife works at the [employer], is the [employer].    PAST MEDICAL HISTORY:  1.  Sclerosing cholangitis with recurrent episodes recently. He has actually had disease for 7 years.  2.  Adult-onset diabetes.  3.  Hypothyroidism.  4.  Gout.  5.  Hypertension.  6.  Hyperlipidemia.  7.  Arthritis.  8.  Dysphagia with prior esophageal dilatation.  9.  Right ankle surgery.  10.  The patient has had a thyroidectomy.    ALLERGIES: PENICILLIN AND FLAGYL, PENICILLIN CAUSED A RASH.   MEDICATIONS:  Metformin 500 mg b.i.d., hydrochlorothiazide 25 mg daily, lisinopril 40 mg a day, levothyroxine 0.088 mg daily, famotidine 20 mg a day, allopurinol 100 mg a day, omeprazole 40 mg a day, Lodine 500 mg b.i.d. p.r.n. for arthritis, Lipitor 10 mg a day.   FAMILY HISTORY: Positive for sister with a brain tumor and multiple members with coronary artery disease.   REVIEW OF SYSTEMS: He has had vomiting, has had abdominal pain. Denies chest pain. No  breathing abnormalities. No hematemesis. No hematochezia. No melena. No dysuria or hematuria. He had been on Avelox at home.   PHYSICAL EXAMINATION: GENERAL: White male in  no acute distress. Examined and interviewed with his wife.  HEENT: Sclerae are questionably slightly icteric. Conjunctivae negative. Tongue negative. The head is atraumatic.  NECK: No carotid bruits.  CHEST: Clear.  HEART: No murmurs, gallops, clicks or rubs I can hear.  ABDOMEN: Minimal tenderness right upper quadrant. Bowel sounds present. No distention. SKIN: Slightly diaphoretic on the face.  EXTREMITIES: No edema.   LABORATORY AND RADIOLOGICAL DATA:  Ultrasound today shows no evidence of acute cholecystitis or cholelithiasis. Common duct measures 5.2 mm, liver demonstrates normal echogenicity. No intra- or extrahepatic ductal dilatation. No pericholecystic fluid. Lab data are pending. Blood cultures have been drawn as well as CBC, MET-B, TSH and a comprehensive metabolic profile.   ASSESSMENT: Probable recurrence of infection with sclerosing cholangitis.   PLAN: Agree with choice of antibiotics, meropenem. Await blood culture results. We will discuss with Dr. Candace Cruise in the morning. I will change him from n.p.o. to sips and chips.   ____________________________ Manya Silvas, MD rte:cs D: 11/28/2012 18:25:00 ET T: 11/28/2012 18:44:50 ET JOB#: [id number]  cc: Lupita Dawn. Candace Cruise, MD Manya Silvas, MD, <Dictator> Adin Hector, MD   Manya Silvas MD ELECTRONICALLY SIGNED 12/03/2012 12:32

## 2014-05-31 NOTE — Consult Note (Signed)
Feels ok today. LFT coming down. ERCP c/w PSC. GB filling with contrast. Left intrahepatic system dilated, suggesting poor drainage. Elected to place 7 Fr x 7 cm into the left intrahepatics. Difficult to push at the end. Not clear if proximal stent reached the dilated system. Bile appears to be draining. Clear liquids rest of today. Advance diet as tolerated. May need longer Abx course. May even consider long term Abx prophylaxis if cholangitis keeps recurring. Would recommend tertiary referral depending on the insurance coverage for long term care. I will be out tomorrow. If problems arise, contact GI on call. THanks.  Electronic Signatures: Verdie Shire (MD)  (Signed on 23-Oct-14 15:52)  Authored  Last Updated: 23-Oct-14 15:52 by Verdie Shire (MD)

## 2014-05-31 NOTE — Consult Note (Signed)
Pt in bathroom. Feels better today. LFT going up. For percutaneous GB drainage today. Hopefully, will relieve obstruction/sepsis. Will check back later. Thanks.  Electronic Signatures: Verdie Shire (MD)  (Signed on 18-Sep-14 08:31)  Authored  Last Updated: 18-Sep-14 08:31 by Verdie Shire (MD)

## 2014-05-31 NOTE — H&P (Signed)
PATIENT NAME:  Jesse Thompson, CRASS MR#:  [id number] DATE OF BIRTH:  1950-04-29  DATE OF ADMISSION:  11/11/2012  PRIMARY CARE PHYSICIAN:  Dr. Caryl Comes  CHIEF COMPLAINT: Right upper quadrant pain.   HISTORY OF PRESENT ILLNESS: This 64 year old male patient with history of diabetes mellitus, hypertension, Graves' disease, and primary sclerosing cholangitis, presents to the Emergency Room from home complaining of acute onset of right upper quadrant pain. The patient was recently in the hospital with septic cholangitis with positive Klebsiella from the biliary drain. Was sent home on Avelox, which he finished a few days back. The patient was doing well, feeling stronger. 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 He also had some nausea, but no vomiting. No diarrhea or constipation. No pale stools or yellow urine. He has not had a fever or rash. Today in the Emergency Room, his bilirubin is elevated at 2. AST, ALT are 362 and 216, with alk phos of 176. All these numbers were normal at the time of discharge on 10/30/2012.   The patient had an ERCP done on 10/12/2012. About 12 days after this, patient had worsening pain, presented to the hospital, and was finally discharged on 10/31/2012 on Avelox after having sepsis from cholangitis secondary to Klebsiella enterococcus.   PAST MEDICAL HISTORY: 1. Diabetes mellitus type 2.  2. Hypertension.  3. Graves' disease.  4. Gout.  5. Primary sclerosing cholangitis.  6. Hypothyroidism.   FAMILY HISTORY: Breast cancer in a sister. Father and mother had heart disease. No GI or liver cancers in the family.   SOCIAL HISTORY: The patient does not smoke. No alcohol. No illicit drugs. Works as a Administrator.   CODE STATUS:  FULL CODE.   ALLERGIES: FLAGYL and PENICILLIN, which cause rash and itching.   REVIEW OF SYSTEMS  CONSTITUTIONAL: Complains of some fatigue. No weight loss, weight gain.   EYES: No blurred vision, pain or redness.  ENT: No tinnitus, ear pain, hearing loss.  RESPIRATORY: No cough, wheeze, hemoptysis.  CARDIOVASCULAR: No chest pain, orthopnea, edema.  GASTROINTESTINAL: Has some nausea. No vomiting or diarrhea. Has abdominal pain.  GENITOURINARY: No dysuria, hematuria, frequency.  ENDOCRINE: No polyuria, nocturia, thyroid problems.  HEMATOLOGIC AND LYMPHATIC: No anemia, easy bruising, bleeding.   INTEGUMENTARY: No acne, rash, lesions.  MUSCULOSKELETAL: No back pain, arthritis. Does have history of gout.  NEUROLOGIC: No focal numbness, weakness, dysarthria, seizures. PSYCHIATRIC:  No anxiety or depression.   HOME MEDICATIONS INCLUDE:  1. Levothyroxine 88 mcg daily.  2. Metformin 500 mg b.i.d.  3. Famotidine 20 mg daily.  4. Lisinopril 40 mg daily.  5. Amlodipine 10 mg daily.  6. Allopurinol 100 mg daily.   PHYSICAL EXAMINATION: VITAL SIGNS: Temperature 98.5, pulse 71, respirations 20, blood pressure 142/68, saturating 99% on room air.  GENERAL: Obese Caucasian male patient lying in bed. Seems comfortable after his pain medication. Alert and oriented x 3. Mood and affect appropriate. Judgment intact.  HEENT: Atraumatic, normocephalic. Oral mucosa moist and pink. External ears and nose normal. No pallor, icterus positive. Pupils bilaterally equal and react to light.  NECK: Supple. No thyromegaly, no palpable lymph nodes. Trachea midline. No carotid bruit, JVD.  CARDIOVASCULAR: S1, S2, without any murmurs. Peripheral pulses 2+. No edema.  RESPIRATORY:  Normal work of breathing. Clear to auscultation on both sides.  GASTROINTESTINAL: Soft abdomen. Tenderness in the right upper quadrant epigastric area. Murphy's sign is  negative. Bowel sounds are present. No hepatosplenomegaly palpable. No external drains or wounds.  GENITOURINARY:  No CVA tenderness or bladder distention.  SKIN: Warm and dry. No petechiae, rash, ulcers.  MUSCULOSKELETAL: No joint swelling,  redness, effusion of the large joints. Normal muscle tone.  NEUROLOGICAL: Motor strength 5/5 in upper and lower extremities.  LYMPHATIC: No cervical, supraclavicular lymphadenopathy.   LAB STUDIES:  Show glucose 159, BUN 22, creatinine 1.4, sodium 142, potassium 4.2, GFR 53. AST, ALT 362, 216, alk phos of 176, bilirubin 2. WBC 9.8, hemoglobin 12.7, platelets of 328. Urinalysis shows no bacteria.   ASSESSMENT AND PLAN: 1.  Transaminitis secondary to patient's primary sclerosing cholangitis. The patient does not have any fever, or WBC. I will not start him on any antibiotics. The patient did have recent ERCP and biliary drain placed, and his last ultrasound did not show any gallstones. I would not expect him to have any gallstones this quick, although there is a high incidence of gallstones in patients with primary sclerosing cholangitis. The patient could have structures from recent procedures, which could lead to hyperbilirubinemia. Will check an ultrasound of the abdomen. The patient may need MRCP or ERCP, depending on his findings of the ultrasound. We will consult GI, Dr. Thurmond Butts from GI has been informed from the Emergency Room. Also of note is that his last CT scan finding of the abdomen, which raised concern regarding possible cholangiocarcinoma. but this needs to be correlated with the ERCP finding done by Dr. Candace Cruise in the past. The patient will be n.p.o. except medications.   2. Hypertension. Continue home medications. Will hold the lisinopril secondary to worsening renal function. Continue the amlodipine.   3.  Diabetes mellitus, type II: Hold metformin. Put him on sliding scale insulin.   4.  Acute renal failure/chronic kidney disease secondary to dehydration. Will start him on intravenous fluids and monitor.   5.  Deep vein thrombosis prophylaxis with heparin.   CODE STATUS: Full code.   Time spent today on this case was 43 minutes.      ____________________________ Leia Alf Anwen Cannedy,  MD srs:mr D: 11/11/2012 21:18:00 ET T: 11/11/2012 22:00:12 ET JOB#: [id number]  cc: Lupita Dawn. Candace Cruise, MD Tanna Furry, MD [place] R. Darvin Neighbours, MD, <Dictator> Adin Hector, MD     Neita Carp MD ELECTRONICALLY SIGNED 11/12/2012 2:39

## 2014-05-31 NOTE — Consult Note (Signed)
PATIENT NAME:  Jesse Thompson, Jesse Thompson MR#:  [id number] DATE OF BIRTH:  [birth year]  DATE OF CONSULTATION:  11/12/2012  REFERRING PHYSICIAN:  Dr. Ramonita Lab  CONSULTING PHYSICIAN:  Arther Dames, MD  REASON FOR THE CONSULT: Abdominal pain, elevated liver enzymes.   HISTORY OF PRESENT ILLNESS: Jesse Thompson is a 64 year old male with a history of primary sclerosing cholangitis, type 2 diabetes, hypertension, presenting to the hospital for evaluation of about abdominal pain. Jesse Thompson reports that yesterday he had relatively sudden onset of periumbilical pain. He thinks this started shortly after he ate some [employer] and also shortly after he move his bowels. He presented to the Emergency Room for further evaluation. In the Emergency Room, he was found to have an elevated total bilirubin at 2 and also had elevation in his AST and ALT at 362 and 216 along with alkaline phosphatase of 176. He had an ultrasound done in the Emergency Room which showed some  intrahepatic biliary ductal dilation but and some thickening of his gallbladder wall.   Jesse Thompson does report this is similar pain to his recent hospitalization in September. Of note, he did undergo an ERCP on September 4, for an abnormal M.R.C.P. and a history of [place]. There was no dominant stricture found during that exam in his biliary tree was swept with a 12 mm balloon with nothing noted. He was admitted to the hospital on 10/24/2012 for  pain and elevated liver enzymes. During that hospitalization he did end up having a percutaneous biliary drain placed. During that hospitalization, he was felt to also have to have cholangitis. Based on this, he had a percutaneous biliary drain placed. He did improve with normalization of his liver enzymes. His drain was pulled when he was discharged on September 24 and he did have a post drain removal cholangiogram, which was consistent again [facility] with no dominant stricture.   PAST MEDICAL HISTORY:   1.  Primary sclerosing  cholangitis.  2.  DM2.  3.  Hypertension.  4.  Graves' disease.  5.  Gout.  6.  Hypothyroidism.   FAMILY HISTORY: No family history of colon cancer or liver disease.   SOCIAL HISTORY: He denies to me any alcohol, tobacco or recreational drugs.   REVIEW OF SYSTEMS:   CONSTITUTIONAL: No weight gain or weight loss.  No fever or chills. HEENT: No oral lesions or sore throat. No vision changes. GASTROINTESTINAL: See HPI.  HEME/LYMPH: No easy bruising or bleeding. CARDIOVASCULAR: No chest pain or dyspnea on exertion. GENITOURINARY: No hematuria. INTEGUMENTARY: No rashes or pruritus PSYCHIATRIC: No depression/anxiety.  ENDOCRINE: No heat/cold intolerance, no hair loss or skin changes. ALLERGIC/IMMUNOLOGIC: Negative for hives. RESPIRATORY: No cough, no shortness of breath.  MUSCULOSKELETAL: No joint swelling or muscle pain.  HOME MEDICATIONS:  Levothyroxine 88 mcg daily, metformin 500 mg twice a day, famotidine 20 mg daily, lisinopril 40 mg daily, amlodipine 10 mg daily, allopurinol 100 mg daily.   PHYSICAL EXAMINATION:   VITAL SIGNS:  Temperature is currently 99, pulse is 54, respirations are 18, blood pressure 124/78. He is 95% on 2 liters of oxygen.  GENERAL: Alert and oriented times 4.  No acute distress. Appears stated age. HEENT: Normocephalic/atraumatic. Extraocular movements are intact. Anicteric. NECK: Soft, supple. JVP appears normal. No adenopathy. CHEST: Clear to auscultation. No wheeze or crackle. Respirations unlabored. HEART: Regular. No murmur, rub, or gallop.  Normal S1 and S2. ABDOMEN: Very mild, periumbilical tenderness, soft, normoactive bowel sounds.  No rebound or guarding.   EXTREMITIES: No  swelling, well perfused. SKIN: No rash or lesion. Skin color, texture, turgor normal. NEUROLOGICAL: Grossly intact. PSYCHIATRIC: Normal tone and affect. MUSCULOSKELETAL: No joint swelling or erythema.   LABORATORY DATA: Sodium 141, potassium 4.1, chloride 109, bicarbonate  25, BUN 15, creatinine 1.16, lipase is 233, which is normal. Liver enzymes: Total protein 6.3, albumin 3.2, total bilirubin is 2.9, which is up from 2.0 yesterday, alkaline phosphatase 188, AST 312, ALT 238. White count 6.7, hemoglobin 11.7, hematocrit is 35, platelets are 248.   Right upper quadrant ultrasound:  1.  Persisting gallbladder wall thickening, but no stones or sludge or pericholecystic fluid and no Murphy's sign.  2.  Intrahepatic ductal dilation.   ASSESSMENT:  Abdominal pain, elevated liver enzymes: It certainly seems as though Jesse Thompson has had an increase in total bilirubin, AST, ALT and alkaline phosphatase since he was recently discharged from the hospital. It is not clear to me whether this is related to the pain that he is having. The pain that he showed me seemed to be more periumbilical. However,  he reports this was similar to the pain that he is having when he had cholangitis previously. Therefore, I am going to assume that this pain is related to the elevated liver enzymes. Currently he is not showing any evidence of cholangitis. He has not had any fevers. He overall feels well and his white count has been normal.   To better elicit the cause of his pain and elevated liver enzymes, I think an M.R.C.P. would be helpful. This may potentially help to better delineate if he does have cholecystitis based on the gallbladder wall thickening. It will also help [place] better evaluate his biliary tree to look for evidence of a dominant stricture that could potentially be intervened upon or would require percutaneous biliary drainage.   In the meantime, please continue to monitor his liver enzymes. It is okay to continue antibiotics for empiric coverage of cholangitis, but this is not mandatory given he is not displaying any signs or symptoms of cholangitis. Further recommendations will be pending the findings on the M.R.C.P. I will also await surgical input as they also know this patient  quite well.   Thank you for this consult   ____________________________ Arther Dames, MD mr:cc D: 11/12/2012 15:19:36 ET T: 11/12/2012 18:38:58 ET JOB#: [id number]  cc: Arther Dames, MD, <Dictator> Mellody Life MD ELECTRONICALLY SIGNED 11/14/2012 16:37

## 2014-05-31 NOTE — Consult Note (Signed)
Chief Complaint:  Subjective/Chief Complaint The patient has had no further compalints. His liver enzymes have come down today.   VITAL SIGNS/ANCILLARY NOTES: **Vital Signs.:   26-Oct-14 09:35  Temperature Temperature (F) 97.5  Celsius 36.3  Temperature Source oral  Pulse Pulse 73  Respirations Respirations 20  Systolic BP Systolic BP 563  Diastolic BP (mmHg) Diastolic BP (mmHg) 893  Mean BP 118  BP Source  if not from Vital Sign Device notified nurse/ Hearher RN  Pulse Ox % Pulse Ox % 98  Pulse Ox Activity Level  At rest; sitting on bedside  Oxygen Delivery Room Air/ 21 %  *Intake and Output.:   Shift 26-Oct-14 15:00  Grand Totals Intake:   Output:  575    Net:  -575 24 Hr.:  -575  Urine ml     Out:  575  Length of Stay Totals Intake:  8240 Output:  9175    Net:  -734   Brief Assessment:  GEN well developed, well nourished   Respiratory normal resp effort   Additional Physical Exam Alert and orientated times 3   Lab Results: Hepatic:  26-Oct-14 04:08   Bilirubin, Total 0.8  Bilirubin, Direct  0.5 (Result(s) reported on 03 Dec 2012 at 05:03AM.)  Alkaline Phosphatase  206  SGPT (ALT)  81  SGOT (AST)  51  Total Protein, Serum 7.0  Albumin, Serum  3.3  Routine Hem:  26-Oct-14 04:08   WBC (CBC) 9.1  RBC (CBC) 4.84  Hemoglobin (CBC) 13.1  Hematocrit (CBC)  39.5  Platelet Count (CBC) 212  MCV 82  MCH 27.1  MCHC 33.3  RDW 14.4  Neutrophil % 58.4  Lymphocyte % 28.2  Monocyte % 8.4  Eosinophil % 4.1  Basophil % 0.9  Neutrophil # 5.3  Lymphocyte # 2.6  Monocyte # 0.8  Eosinophil # 0.4  Basophil # 0.1 (Result(s) reported on 03 Dec 2012 at 05:03AM.)   Assessment/Plan:  Assessment/Plan:  Assessment PSC with stent in place.   Plan The patient is doing well this am and his labs are better. He can be discharged from a GI point of view and should follwo up with Dr. Candace Cruise as an outpatient.   Electronic Signatures: Lucilla Lame (MD)  (Signed 26-Oct-14  10:12)  Authored: Chief Complaint, VITAL SIGNS/ANCILLARY NOTES, Brief Assessment, Lab Results, Assessment/Plan   Last Updated: 26-Oct-14 10:12 by Lucilla Lame (MD)

## 2014-05-31 NOTE — Discharge Summary (Signed)
PATIENT NAME:  Jesse Thompson, Jesse Thompson MR#:  [id number] DATE OF BIRTH:  04-Nov-1950  DATE OF ADMISSION:  11/11/2012 DATE OF DISCHARGE:  11/14/2012  FINAL DIAGNOSES: 1.  Cholangitis.  2.  Primary sclerosing cholangitis.  3.  Hypertension.  4.  Adult onset diabetes mellitus.  5.  History of Graves disease.  6.  Gout.  7.  Hypothyroidism.   HISTORY AND PHYSICAL: Please see dictated admission history and physical.  HOSPITAL COURSE:  The patient was admitted with evidence of cholangitis. He underwent MRCP which showed good biliary drainage. Surgery and GI both saw the patient, felt this was more consistent with cholangitis than cholecystitis. He appeared to respond to antibiotic therapy and IV fluids, with improvement in his bilirubin down to 1.1. He is tolerating a regular diet, wants to go home, so at this point he will be discharged to home in stable condition with his physical activity up as tolerated. We will have him return to work in 1 week. We will have him follow up with Dr. Rayann Heman within the next 2 weeks. His diet should be low fat, low cholesterol.   DISCHARGE MEDICATIONS: 1.  Metformin 500 mg p.o. b.i.d.  2.  Allopurinol 100 mg p.o. daily.  3.  Lisinopril 40 mg p.o. daily.  4.  Amlodipine 10 mg p.o. daily.  5.  Famotidine 20 mg p.o. daily.  6.  Levothyroxine 0.088 mg p.o. daily.  7.  Avelox 400 mg p.o. daily x 7 days.  8.  Norco 5/325 mg 1 p.o. q. 6 hours p.r.n. severe pain.  ____________________________ Adin Hector, MD bjk:sb D: 11/14/2012 08:24:27 ET T: 11/14/2012 09:14:50 ET JOB#: [id number]  cc: Adin Hector, MD, <Dictator> Ramonita Lab MD ELECTRONICALLY SIGNED 11/17/2012 8:02

## 2014-05-31 NOTE — Consult Note (Signed)
PATIENT NAME:  Jesse Thompson, Jesse Thompson MR#:  [id number] DATE OF BIRTH:  10-Jan-1951  DATE OF CONSULTATION:  10/25/2012  REFERRING PHYSICIAN:  Dawn Harrison/Paul Oh, MD. CONSULTING PHYSICIAN:  Robert Bellow, MD  PRIMARY PHYSICIAN: Ramonita Lab, M.D.   INDICATION FOR CONSULTATION: Abdominal pain, gas within the gallbladder.   CLINICAL NOTE: This 64 year old male carries a diagnosis of primary sclerosing cholangitis. He underwent an ERCP approximately 2 weeks ago to evaluate the status of disease after a preprocedure MRI. No new strictures were identified. Sphincterotomy was performed. No stents were placed. He reports he developed diffuse abdominal pain after the completion of the procedure that has persisted since that time. The pain is diffuse across the abdomen and at times is aggravated by meals. Five days ago, the patient reported chills but he did not report this to his wife or Dr. Candace Cruise. He experienced increasing abdominal pain yesterday and presented for evaluation. He was subsequently admitted with an elevated temperature, mild leukocytosis and treated for suspected cholangitis. CT scan completed at the time of admission showed air within the gallbladder, not within the gallbladder wall. Ultrasound completed today showed no sonographic evidence of acute cholecystitis (negative Murphy sign). No evidence of gallbladder wall thickening or pericholecystic fluid. No visualization of the common duct. A simple cyst of the right upper pole of the kidney was appreciated.   The patient reports being hungry at this time. He reports mild diffuse pain relieved with p.r.n. narcotics.   PAST MEDICAL HISTORY: Notable for noninsulin-dependent diabetes, essential hypertension, hypothyroidism, as well as primary sclerosing cholangitis.   REGULAR MEDICATIONS: Consist of allopurinol 100 mg p.o. daily, Norvasc 10 mg daily, Pepcid 20 mg at bedtime, HCTZ 25 mg daily, Synthroid 0.88 mcg daily, lisinopril 40 mg daily,  metformin 500 mg b.i.d. and metoprolol 100 mg daily.   ALLERGIES: Consist of PENICILLIN AND FLAGYL which cause rash and itching.   CLINICAL EXAM:  GENERAL: Showed the patient to be awake, alert and orientated. He was cooperative throughout the exam.  VITAL SIGNS: At this time showed a temperature of 101.6. Blood pressure 112/61. Pulse 58, respirations 20. Room air saturation not recorded.  HEAD AND NECK: Showed the patient to be normocephalic. Pupils equal and reactive to light. No cervical adenopathy. No supraclavicular adenopathy.  CHEST: Clear to auscultation.  CARDIAC: Regular rhythm without murmur, rub or gallop.  ABDOMEN: Mildly obese, soft and nontender. Normal bowel sounds. No focal tenderness in the upper abdomen or throughout the abdomen. No inguinal hernias evident.  EXTREMITIES: Femoral and pedal pulses intact. No peripheral edema.   SUMMARY OF LABORATORY STUDIES: As follows: Admission white blood cell count 11,700, 11,400 today. Modest left shift, improved since admission. Hemoglobin 12.5 on admission, 10,700 at this time. Mild fall in the platelet count from 190,000 on admission to 143,000 at this time. Liver function studies initially showed a mild elevation of serum bilirubin which has since improved. Scant elevation of serum transaminases. Normal electrolytes. Mild elevation of serum creatinine at 1.3, with a decreased estimated GFR of 58. Blood sugars have been under good control. Blood cultures have shown gram-negative rods.   The CT and ultrasound were reviewed. Plain films of the chest were reviewed.   IMPRESSION: Contamination of the biliary tree during recent ERCP with low-grade cholangitis.   There is no evidence of an emphysematous gallbladder or previous biliary symptoms to suggest a primary gallbladder wall pathology. The patient may well be served by percutaneous drainage with culture to confirm that the positive blood  cultures are secondary to the biliary tree and not  a secondary source. (Review of the CT scan showed no other areas of concern on my inspection.)   The procedure has been discussed with the patient and his family. The upcoming procedure was discussed with Kathreen Devoid, M.D., from radiology. Will plan to check his coagulation studies this evening and if all is in order, he will undergo percutaneous drainage tomorrow.   ____________________________ Robert Bellow, MD jwb:gb D: 10/25/2012 21:45:49 ET T: 10/25/2012 22:41:40 ET JOB#: [id number]  cc: Robert Bellow, MD, <Dictator> Adin Hector, MD Lupita Dawn. Candace Cruise, MD JEFFREY Amedeo Kinsman MD ELECTRONICALLY SIGNED 10/30/2012 9:19

## 2014-05-31 NOTE — Consult Note (Signed)
General Aspect 64 -year-old male with diagnosis of primary sclerosing cholangitis undergoing biliary stent yesterday, now with postop bradycardia, sinus rhythm,  mostly in the 40s,  with the patient asymptomatic , maintaining adequate blood pressure. Patient recently had a cardiac workup in the first part of October due to risk factors and abnormal EKG where he showed excellent exercise tolerance and normal myocardial perfusion and normal images without evidence of myocardial ischemia.  Cardiac enzymes have been negative.  He currently denies abdominal pain or chest pain shortness of breath.  He then had been on Metoprolol but had stopped all of his medications a couple weeks ago.   Physical Exam:  GEN well developed, no acute distress   HEENT pink conjunctivae   NECK supple   RESP normal resp effort  clear BS   CARD Bradycardic  Normal, S1, S2   ABD soft   EXTR negative edema   SKIN skin turgor decreased   NEURO cranial nerves intact, motor/sensory function intact   PSYCH alert, A+O to time, place, person   Review of Systems:  Subjective/Chief Complaint Bradycardia   General: No Complaints   Skin: No Complaints   ENT: No Complaints   Eyes: No Complaints   Neck: No Complaints   Respiratory: No Complaints   Cardiovascular: No Complaints   Gastrointestinal: Abdominal complaints resolved   Genitourinary: No Complaints   Vascular: No Complaints   Musculoskeletal: No Complaints   Neurologic: No Complaints   Hematologic: No Complaints   Endocrine: No Complaints   Psychiatric: No Complaints   Review of Systems: All other systems were reviewed and found to be negative   Medications/Allergies Reviewed Medications/Allergies reviewed   Lab Results: LabObservation:  23-Oct-14 15:43   OBSERVATION Reason for Test ERCP  Hepatic:  23-Oct-14 04:07   Bilirubin, Total  1.3  Alkaline Phosphatase  139  SGPT (ALT)  97  SGOT (AST)  76  Total Protein, Serum  6.0   Albumin, Serum  2.7  Routine Chem:  23-Oct-14 04:07   Result Comment manual diff - resulted previous CBC...mmc  Result(s) reported on 30 Nov 2012 at 09:22AM.  Glucose, Serum 93  BUN 15  Creatinine (comp) 1.22  Sodium, Serum 141  Potassium, Serum 4.3  Chloride, Serum  111  CO2, Serum 25  Calcium (Total), Serum 9.0  Osmolality (calc) 282  eGFR (African American) >60  eGFR (Non-African American) >60 (eGFR values <6m/min/1.73 m2 may be an indication of chronic kidney disease (CKD). Calculated eGFR is useful in patients with stable renal function. The eGFR calculation will not be reliable in acutely ill patients when serum creatinine is changing rapidly. It is not useful in  patients on dialysis. The eGFR calculation may not be applicable to patients at the low and high extremes of body sizes, pregnant women, and vegetarians.)  Anion Gap  5  Routine Hem:  23-Oct-14 04:07   WBC (CBC) 7.5  RBC (CBC)  3.99  Hemoglobin (CBC)  11.1  Hematocrit (CBC)  33.1  Platelet Count (CBC)  94  MCV 83  MCH 27.9  MCHC 33.5  RDW 14.2  Neutrophil % 63.7  Lymphocyte % 21.4  Monocyte % 12.5  Eosinophil % 1.9  Basophil % 0.5  Neutrophil # 4.8  Lymphocyte # 1.6  Monocyte # 0.9  Eosinophil # 0.1  Basophil # 0.0 (Result(s) reported on 30 Nov 2012 at 05:17AM.)  Manual Diff -  Segmented Neutrophils 68  Lymphocytes 16  Monocytes 15  Basophil 1  Diff Comment 1  ANISOCYTOSIS  Diff Comment 2 POIKILOCYTOSIS  Diff Comment 3 PLTS VARIED IN SIZE  Result(s) reported on 30 Nov 2012 at 05:17AM.   Radiology Results: [place]:    21-Oct-14 17:19, US Abdomen Limited Survey  US Abdomen Limited Survey   REASON FOR EXAM:    abd pain/probable cholangitis  COMMENTS:   Body Site: GB and Fossa, CBD, Head of Pancreas    PROCEDURE: [place]  - US ABDOMEN LIMITED SURVEY  - Nov 28 2012  5:19PM     RESULT: Comparison: None    Technique: Multiple gray-scale and color-flow Doppler images of the right   upper quadrant  are presented for review.    Findings:    Visualized portions of the liver demonstrate normal echogenicity and   normal contours. The liver is without evidence of a focal hepatic lesion.   There is no cholelithiasis or biliary sludge. There is no intra- or   extrahepatic biliary ductal dilatation. The common duct measures 5.2 mm   in maximal diameter. There is no pericholecystic fluid. There is one   small focal area of gallbladder wall thickening measuring 5 mm. The   remainder the gallbladder wall is normal in thickness. There is a   negative sonographic Murphy's sign.    The visualized portion of the pancreas is normal in echogenicity.    IMPRESSION:     No cholelithiasis or sonographic evidence of acute cholecystitis.    Dictation Site: 1    Verified By: Jennette Banker, M.D., MD    Penicillin: Itching  Flagyl: Itching  Vital Signs/Nurse's Notes: **Vital Signs.:   24-Oct-14 08:00  Vital Signs Type Routine    11:00  Vital Signs Type Routine  Temperature Temperature (F) 98  Celsius 36.6  Temperature Source oral  Pulse Pulse 40  Pulse source if not from Vital Sign Device per cardiac monitor  Respirations Respirations 16  Systolic BP Systolic BP 237  Diastolic BP (mmHg) Diastolic BP (mmHg) 73  Mean BP 108  Pulse Ox % Pulse Ox % 100  Pulse Ox Activity Level  At rest  Oxygen Delivery Room Air/ 21 %  Pulse Ox Heart Rate 44  Telemetry pattern Cardiac Rhythm Bradycardia    Impression 64 year old male admitted with cholangitis status post biliary stent with postop sinus bradycardia, asymptomatic.   Plan 1.  Continue telemetry for 24 hours following for bradycardia and tolerance. Patient okay for discharge tomorrow if continues to be asymptomatic with bradycardia. 2.  Avoid beta blockers or any other rate control drugs. 3.  Ambulate observing for heart rate response and tolerance. 4. Followup early next week with Dr. Nehemiah Massed for followup bradycardia  and possible Holter  monitor.  Patient was seen in collaboration with Dr. Nehemiah Massed.   Electronic Signatures: Roderic Palau (NP)  (Signed 24-Oct-14 12:55)  Authored: General Aspect/Present Illness, History and Physical Exam, Review of System, Labs, Radiology, Allergies, Vital Signs/Nurse's Notes, Impression/Plan   Last Updated: 24-Oct-14 12:55 by Roderic Palau (NP)

## 2014-05-31 NOTE — Consult Note (Signed)
CC: sclerosing cholangitis, his LFT's are steady/falling with TB 1.0 and 2 days ago was 3.3.  Less pain.  Pt moved to CCU due to signif bradycardia with heart rate in 30's and now in low 40"s.  No new GI recommendations.  Pt wife says they were cleared to go to either [facility] or [facility],.  I think they prefer [facility].  After discharge will see Dr. Candace Cruise and [facility] and arrangements to be made to go to [facility]  Electronic Signatures: Manya Silvas (MD)  (Signed on 24-Oct-14 17:17)  Authored  Last Updated: 24-Oct-14 17:17 by Manya Silvas (MD)

## 2014-05-31 NOTE — Consult Note (Signed)
PATIENT NAME:  Jesse Thompson, Jesse Thompson MR#:  [id number] DATE OF BIRTH:  1950/03/19  DATE OF CONSULTATION:  10/25/2012  CONSULTING PROVIDERS:  Payton Emerald, NP, and Verdie Shire, MD.  ATTENDING PHYSICIAN: Dr. Lavetta Nielsen.  PRIMARY CARE PHYSICIAN: Ramonita Lab, MD.  Consult is for [facility].  REASON FOR CONSULTATION: Status post ERCP, sepsis, melena.   HISTORY OF PRESENT ILLNESS: 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 He had an ERCP done on 10/12/2012, which revealed multiple areas of narrowing and repeated picture consistent with primary biliary sclerosis. The patient states that he has been under the care of Dr. Tamala Julian at [facility]. He has actually been on the transplant list in the past. He has not been considered at least for transplantation for the past 5 years as his LFTs had improved.   He presented to the Emergency Room yesterday for the concern of worsening abdominal pain. He has actually been experiencing abdominal pain since having his ERCP done 2 weeks ago. Initially the color of stool was black for a couple of days following but states after 2 to 3 days of the color, the questionable melena actually had cleared up. Prior to have an ERCP done, he had  an EGD done by Dr. Rayann Heman which did reveal evidence of Helicobacter pylori and he was treated with antibiotic therapy.   Epigastric discomfort which he was having prior to having the ERCP is actually better. His wife was present, who aided in history, and states that this is a different pain now which he has been experiencing. The pain has progressively become constant, generalized in nature, achy, a dull pain which does exacerbate at times. Nausea yesterday with dry heaving. Fever Sunday and Monday. Upon presenting to the Emergency Room yesterday, he states that his temperature was 103. He was experiencing  chills. Last bowel movement was yesterday, brown in color. Appetite has been decreased, but he denies having any weight loss. Bowels move on average once a day. Currently pain is better, tolerable, but he did just receive something for pain.   Chemistry panel on admission was within normal limits except glucose was 179. Today, glucose is 128. Creatinine has risen to 1.31. Chloride is 109.  EGFR is greater than 60. Anion gap has dropped to 6. Calcium was 9.8 and has declined to 8.2. Blood cultures x2 done, which revealed gram-negative rods.  Hepatic panel on admission: Total protein is 6.3 with an albumin of 3.3 and total bili 1.2. AST was 54, otherwise within normal limits. Comparison today: Albumin has dropped from 3.3 to 2.6. Total protein has dropped from 6.3 to 5.3. AST has remained elevated, but improved at 45. Alk phos has remained within normal limits, as well as the ALT. Troponin is 0.04.  WBC count on admission was 11.7, RBC of 4.2, with hemoglobin of 12.5, and hematocrit of 36.8. WBC count today is 11.4, RBC is 3.59, hemoglobin 10.7, with hematocrit of 31.8. Platelet count has dropped from 190,000 to 143,000.  Urinalysis essentially was within normal limits. EKG reveals sinus rhythm with frequent PVCs. CT with the abdomen and pelvis with contrast revealed lung bases to be clear. No pneumothorax. Heart size is normal. Liver demonstrates no focal abnormalities. Spleen demonstrated no focal abnormality. There is a hypodense fluid-attenuating right renal mass consistent with a cyst. Left kidney, adrenal glands and pancreas are normal. Bladder is unremarkable. There is mild intrahepatic biliary ductal  dilatation which is more focal in the posterior right hepatic lobe, similar in appearance, 03/14/2012. No hepatic masses noted. There is appearance in the right hepatic lobe seen in the presence of a PSC versus cholangiocarcinoma. Correlation with ERCP is recommended. There is air present in the gallbladder. The  stomach, duodenum, small intestine and large intestine demonstrate no contrast, extravasation or dilatation. There is no pneumoperitoneum, pneumatosis, or portal venous gas. There is no abdominal or pelvic free fluid and no lymphadenopathy.   REVIEW OF SYSTEMS:  CONSTITUTIONAL: Significant for subjective fever and chills, generalized weakness, which has been progressively worsening over the past two weeks.  EYES: No blurred vision, double vision.  ENT: No hearing pain or discharge.  RESPIRATORY: No coughing, shortness of breath.  CARDIOVASCULAR: No chest pain, heart palpitations.  ABDOMEN: See HPI.  GENITOURINARY: No dysuria, hematuria, but frequency of voiding has declined. More concentrated appearance to urine.  ENDOCRINE: No nocturia, polyuria.   HEME/LYMPHATIC: Denies significant easy bruising and bleeding.  SKIN: No rashes. No lesions.  MUSCULOSKELETAL: No arthralgias or myalgias.  NEUROLOGIC: No history of CVA or TIA.  PSYCHIATRIC: No depression. No anxiety.   PAST MEDICAL HISTORY: Diabetes, hypertension, hypothyroidism, primary sclerosing cholangitis.   PAST SURGICAL HISTORY: ERCP done 10/12/2012.   FAMILY HISTORY: No knowledgeable history of GI or liver pathology. Possible hypertension.   SOCIAL HISTORY: No tobacco. No alcohol use. Married.   HOME MEDICATIONS: Allopurinol 100 mg a day, Norvasc 10 mg a day, Pepcid 20 mg at bedtime, hydrochlorothiazide 25 mg a day, Synthroid 88 mcg a day, lisinopril 40 mg a day, metformin 500 mg twice a day, metoprolol 100 mg daily.   PHYSICAL EXAMINATION:  VITAL SIGNS: Temperature 102.9. Pulse is 56. Respirations are 22. Blood pressure is 109/59, and pulse oximetry is 96% on room air.  GENERAL: Well-developed, well-nourished 64 year old Caucasian gentleman. Mild distress noted. Warm to touch but is arousable to verbal stimuli and unable to follow commands.  HEENT: Normocephalic, atraumatic. Pupils equal and reactive to light. Conjunctivae clear.  Sclerae anicteric.  NECK: Supple. Trachea midline. No lymphadenopathy or thyromegaly.  PULMONARY: Symmetric rise and fall of chest. Clear to auscultation throughout.  CARDIOVASCULAR: Irregular.  S1, S2. No murmurs.  ABDOMEN: Soft, nondistended. Bowel sounds in 4 quadrants. Positive Murphy's sign. Moderate discomfort noted, epigastric left upper quadrant. No bruits. No masses.  RECTAL: Deferred.  MUSCULOSKELETAL: Moving all 4 extremities. No contractures. No clubbing.  SKIN: Color pale, hot, dry. No rashes.  NEUROLOGICAL: No gross neurological deficits.  PSYCHIATRIC:  Alert and oriented x4. Memory grossly intact. Appropriate affect and mood.   LABORATORY, DIAGNOSTIC, AND RADIOLOGICAL DATA: Results as stated under history. In addition, chest x-ray September 16, no evidence of pneumonia or other acute cardiopulmonary abnormality.   IMPRESSION: Generalized abdominal pain, fever, presentation of sepsis status post endoscopic retrograde cholangiopancreatography October 17, 2012. Abnormal gastrointestinal x-ray revealing evidence of mild intrahepatic biliary ductal dilatation which can be seen in correlation with his known history of primary sclerosing cholangitis. Air present and gallbladder can be seen with emphysematous cholecystitis.   PLAN:  1.  The patient's presentation was discussed with Dr. Verdie Shire. We will proceed with a STAT abdominal ultrasound this afternoon for the indication of generalized abdominal pain, fever, now concern for cholecystitis. Evaluate for possible gallstones. Order placed for Tylenol 650 mg, 1 suppository every 6 hours as needed for temperature greater than 101.  2.  Will continue to monitor laboratory studies. Remain n.p.o. status at this time. I do feel that  surgical consultation may be very beneficial. Will await abdominal ultrasound results.   These services provided by Ebony Cargo, NP, under collaborative agreement with Verdie Shire,  MD.   ____________________________ Payton Emerald, NP dsh:np D: 10/25/2012 14:28:51 ET T: 10/25/2012 14:57:04 ET JOB#: [id number]  cc: Payton Emerald, NP, <Dictator> Payton Emerald MD ELECTRONICALLY SIGNED 10/25/2012 18:23

## 2014-05-31 NOTE — Discharge Summary (Signed)
PATIENT NAME:  Jesse Thompson, Jesse Thompson MR#:  [id number] DATE OF BIRTH:  1951/01/27  DATE OF ADMISSION:  11/28/2012 DATE OF DISCHARGE:  12/03/2012  FINAL DIAGNOSES: 1.  Cholangitis.  2.  Biliary obstruction secondary to cholangitis.  3.  Primary sclerosing cholangitis.  4.  Adult onset diabetes mellitus.  5.  Hypothyroidism.  6.  Gout.  7.  Hypertension.  8.  Hyperlipidemia.  9.  Osteoarthritis.  10.  History of dysphagia with prior esophageal dilation.   PRINCIPAL PROCEDURE:  1.  ERCP with biliary stenting.   HISTORY AND PHYSICAL: Please see dictated admission history and physical.   [place]: The patient was admitted with recurrent episodes of cholangitis with multiple prior episodes, and again developed biliary obstruction with this. He was placed on antibiotics, made n.p.o. GI saw the patient and decision was made to pursue ERCP to try biliary stenting to see if we could keep the biliary tract open to prevent further episodes. This was successfully performed, and he tolerated it well. His symptoms improved, his oral intake improved as well, and he was restarted on oral medications. It was felt that he was able to go home, and so he was discharged on a low-fat, low-cholesterol diet, was advised to keep his diet bland for the next 2 to 3 days. His physical activity was to be up as tolerated, and his return t to work was to be after his followup visit with [place] in 1 to 2 weeks.   DISCHARGE MEDICATIONS: 1.  Metformin 500 mg p.o. b.i.d.  2.  Allopurinol 100 mg p.o. daily.  3.  Lisinopril 40 mg p.o. daily.  4.  Amlodipine 10 mg p.o. daily. 5.  Levothyroxine 0.088 mg p.o. daily.  6.  Norco 5/325 mg, 1 p.o. q.6 hours as needed for pain.  7.  Omeprazole 40 mg p.o. daily.  8.  Hydrochlorothiazide 25 mg p.o. daily.  9.  Avelox 400 mg p.o. daily x 20 days.  10.  Phenergan suppositories 25 mg rectally t.i.d. p.r.n. nausea, vomiting.    He was given instructions to stop metoprolol  and atorvastatin.    ____________________________ Adin Hector, MD bjk:dmm D: 12/19/2012 20:19:27 ET T: 12/19/2012 20:52:59 ET JOB#: [id number]  cc: Tama High III, MD, <Dictator> Ramonita Lab MD ELECTRONICALLY SIGNED 12/27/2012 12:57

## 2014-05-31 NOTE — Consult Note (Signed)
CC: abd pain and vomiting.  Pt with sclerosing cholangitis, had ERCP and cholecystostomy previous admission.  His wife says he flares up when he comes off antibiotics.  He got a rash with penicillin but unlikely to react to Meropenam.  Will discuss with Dr. Candace Cruise in morning. Will give ice chips and sips.  Electronic Signatures: Manya Silvas (MD)  (Signed on 21-Oct-14 18:32)  Authored  Last Updated: 21-Oct-14 18:32 by Manya Silvas (MD)

## 2014-05-31 NOTE — H&P (Signed)
PATIENT NAME:  Jesse Thompson, Jesse Thompson MR#:  [id number] DATE OF BIRTH:  1950/11/03  REFERRING PHYSICIAN: Dr. Shirlyn Goltz.   PRIMARY CARE PHYSICIAN: Dr. Ramonita Lab.   HISTORY OF PRESENT ILLNESS: Mr. 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 He recently underwent ERCP on 10/12/2012 which revealed multiple areas of narrowing in repeated picture consistent with primary biliary sclerosis, but no acute strictures were noted. Sphincterotomy was performed. At that time no stents were placed.   He is now presenting with abdominal pain which is periumbilical in nature, dull to sharp in quality, up to 10 in intensity at the worst without radiation and has noticed that it has been worsened by food or movement. He has found some relief with lying completely still and his pain is completely relieved with morphine in the Emergency Department.   This pain has been progressively worsening for approximately a 2-week duration, originally only a dull and crampy sensation after eating. He has had associated nausea but denies any emesis, diarrhea, or constipation. He has also noted small, black, tarry stools periodically for 2 weeks.   In the Emergency Department he was found to be febrile at 103.2 degrees Fahrenheit, given ertapenem.   REVIEW OF SYSTEMS:  CONSTITUTIONAL: Mentions subjective fevers, chills and generalized weakness as above.  EYES: Denies any blurred vision or pain.  ENT: Denies ear pain or discharge.  RESPIRATORY: Denies cough or shortness of breath.  CARDIOVASCULAR: Denies chest pain, palpitations.  GASTROINTESTINAL: Abdominal pain and melena as above.  GENITOURINARY: Denies dysuria or hematuria.  ENDOCRINE: Denies nocturia or polyuria.  HEMOLYMPHATIC: Denies easy bruising or bleeding.  SKIN: Denies rashes or lesions.  MUSCULOSKELETAL: Denies  back pain or arthritis.  NEUROLOGIC: Denies paralysis or paresthesias.  PSYCHIATRIC: Denies anxiety or depressive symptoms.   Otherwise full review of systems performed by me is negative.   PAST MEDICAL HISTORY: Diabetes, hypertension, hypothyroidism, primary sclerosing cholangitis with recent ERCP on September 4.   FAMILY HISTORY: Denies any knowledge of GI or liver pathology. Mentions possible hypertension.   SOCIAL HISTORY: Denies alcohol, tobacco or drug usage.   ALLERGIES: FLAGYL AND PENICILLIN. BOTH CAUSE RASH AND ITCHING.   HOME MEDICATIONS: Allopurinol 100 mg p.o. daily, Norvasc 10 mg p.o. daily, Pepcid 20 mg p.o. at bedtime, hydrochlorothiazide 25 mg p.o. daily, Synthroid 88 mcg p.o. daily, lisinopril 40 daily, metformin 500 mg by mouth twice daily, metoprolol 100 mg p.o. daily.    PHYSICAL EXAMINATION:  VITAL SIGNS: Temperature currently 99, pulse 62, respirations 16, blood pressure 106/71, saturating 96% on room air. Weight 33.1 kg.   GENERAL: In no acute distress, awake, alert and oriented x3.  HEENT: Normocephalic, atraumatic. Extraocular muscles intact. Pupils equal, round, react to light as well as accommodation. No conjunctivae pallor, no nasal lesions or drainage. Ears with no drainage or external lesions. Mouth: No lesions. Moist mucosal membranes.  NECK: Supple and symmetric with no masses. Thyroid midline.  CARDIOVASCULAR: S1, S2, regular rate and rhythm. No murmurs, rubs or gallops.  PULMONARY: Clear to auscultation bilaterally without wheezes, rales, or rhonchi.  ABDOMEN: Soft, nontender, nondistended with positive bowel sounds. Murphy sign is negative. No rebound, tenderness or guarding.  GENITOURINARY: Deferred.  MUSCULOSKELETAL: No edema, cyanosis, or clubbing.  SKIN: Inspection within normal limits. Well hydrated.  LYMPH: No lymphadenopathy.  NEUROLOGIC: Cranial nerves II through XII intact. No gross neurological deficit.  PSYCHIATRIC: Judgment and insight  intact.   LABORATORY DATA: Sodium 137, potassium 3.5, chloride 104, bicarb 24, BUN 13, creatinine 1.24, glucose 179, total protein 6.3, albumin 3.3, bilirubin 1.2, alk phos 95, AST of 54 ALT 60. WBC 11.7, hemoglobin 12.5, hematocrit 36.8, platelets of 190. Urinalysis negative for signs of infection.   EKG: Normal sinus rhythm with occasional PVCs, heart rate 78.   CT performed revealing pneumobilia in the setting of previous ERCP.   ASSESSMENT AND PLAN: A 64 year old gentleman with history of hypertension, diabetes, hypothyroidism, primary sclerosing cholangitis with recent endoscopic retrograde cholangiopancreatography presenting with abdominal pain and melena.  1.  Sepsis from biliary source, sepsis criteria by leukocytosis and temperature, likely from biliary source. Blood cultures x2. IV fluids. Keep mean arterial pressure greater than 65. Antibiotic therapy with ertapenem as HE IS PENICILLIN AND FLAGYL ALLERGIC. Continue with antibiotics. Consult gastroenterology, Dr. Candace Cruise, for potential repeat endoscopic retrograde cholangiopancreatography with stenting. The patient can have clears for now but n.p.o. past midnight.  2.  Melena, with gastroenterology consult, likely from sphincterotomy and no indication for transfusion at this point. Trend hemoglobin and hematocrit.  3.  Hypertension, relatively hypotensive. Hold agents.  4.  Diabetes, type 2. Hold p.o. agents, place on insulin sliding scale with goal blood glucose 120 to 180.  5.  Hypothyroidism with Synthroid.  6. VTE px: SCDs, avoid heparin  CODE STATUS: FULL CODE.   Time spent 45 minutes critical care time.    ____________________________ Aaron Mose. Hower, MD dkh:np D: 10/24/2012 21:09:10 ET T: 10/24/2012 21:41:39 ET JOB#: [id number]  cc: Aaron Mose. Hower, MD, <Dictator> DAVID Woodfin Ganja MD ELECTRONICALLY SIGNED 10/25/2012 0:10

## 2014-05-31 NOTE — Consult Note (Signed)
Chief Complaint:  Subjective/Chief Complaint Stable over the weekend. No more fever or abd pain. LFT normal now.   VITAL SIGNS/ANCILLARY NOTES: **Vital Signs.:   22-Sep-14 09:02  Vital Signs Type Recheck  Temperature Temperature (F) 97.4  Celsius 36.3  Temperature Source axillary   Brief Assessment:  GEN no acute distress   Cardiac Regular   Respiratory clear BS   Gastrointestinal Normal   Lab Results: Hepatic:  22-Sep-14 02:18   Bilirubin, Total 0.7  Bilirubin, Direct  0.3 (Result(s) reported on 30 Oct 2012 at 04:33AM.)  Alkaline Phosphatase 93  SGPT (ALT) 37  SGOT (AST) 33  Total Protein, Serum  5.4  Albumin, Serum  2.2  TDMs:  22-Sep-14 02:18   Vancomycin, Trough LAB  7 (Result(s) reported on 30 Oct 2012 at 03:53AM.)  Routine Hem:  22-Sep-14 02:18   WBC (CBC) 7.4  RBC (CBC)  3.55  Hemoglobin (CBC)  10.4  Hematocrit (CBC)  31.1  Platelet Count (CBC) 173  MCV 88  MCH 29.4  MCHC 33.5  RDW 13.8  Neutrophil % 54.4  Lymphocyte % 17.7  Monocyte % 24.2  Eosinophil % 2.8  Basophil % 0.9  Neutrophil # 4.0  Lymphocyte # 1.3  Monocyte #  1.8  Eosinophil # 0.2  Basophil # 0.1 (Result(s) reported on 30 Oct 2012 at 04:33AM.)   Assessment/Plan:  Assessment/Plan:  Assessment PSC with cholangitis. Resolving with percutaneous drainage and Abx.   Plan Agree with Dr. Bary Castilla about getting cholangiogram through drain. If no dominant stricture present, then ok for drainage to be removed and patient to be discharged on Abx. I will be out at [facility]. Call back if there are more GI issues. Otherwise, will sign off. Thanks.   Electronic Signatures: Verdie Shire (MD)  (Signed 22-Sep-14 10:55)  Authored: Chief Complaint, VITAL SIGNS/ANCILLARY NOTES, Brief Assessment, Lab Results, Assessment/Plan   Last Updated: 22-Sep-14 10:55 by Verdie Shire (MD)

## 2014-05-31 NOTE — Consult Note (Signed)
Chief Complaint:  Subjective/Chief Complaint The patient has [place] and has an external drain in place. His LFT's are better today and he deneis any complaints except some soreness at the site of the stent placement.   VITAL SIGNS/ANCILLARY NOTES: **Vital Signs.:   20-Sep-14 02:52  Vital Signs Type telemetry    07:53  Vital Signs Type Q 4hr  Temperature Temperature (F) 99.5  Celsius 37.5  Pulse Pulse 59  Respirations Respirations 20  Systolic BP Systolic BP 867  Diastolic BP (mmHg) Diastolic BP (mmHg) 78  Mean BP 94  Pulse Ox % Pulse Ox % 90  Pulse Ox Activity Level  At rest  Oxygen Delivery Room Air/ 21 %   Brief Assessment:  GEN well developed, well nourished, no acute distress   Respiratory normal resp effort  no use of accessory muscles   Additional Physical Exam Alert and orientated times 3   Lab Results: Hepatic:  20-Sep-14 03:02   Bilirubin, Total  1.4  Alkaline Phosphatase 80  SGPT (ALT) 49  SGOT (AST) 32  Total Protein, Serum  4.9  Albumin, Serum  2.1  Routine Chem:  20-Sep-14 03:02   Glucose, Serum  116  BUN 15  Creatinine (comp) 1.16  Potassium, Serum  3.4  Chloride, Serum  109  CO2, Serum 25  Calcium (Total), Serum  8.2  Osmolality (calc) 281  eGFR (African American) >60  eGFR (Non-African American) >60 (eGFR values <94m/min/1.73 m2 may be an indication of chronic kidney disease (CKD). Calculated eGFR is useful in patients with stable renal function. The eGFR calculation will not be reliable in acutely ill patients when serum creatinine is changing rapidly. It is not useful in  patients on dialysis. The eGFR calculation may not be applicable to patients at the low and high extremes of body sizes, pregnant women, and vegetarians.)  Anion Gap  6  Routine UA:  20-Sep-14 06:43   Color (UA) Amber  Clarity (UA) Clear  Glucose (UA) 50 mg/dL  Bilirubin (UA) Negative  Ketones (UA) Negative  Specific Gravity (UA) 1.023  Blood (UA) Negative  pH (UA)  6.0  Protein (UA) 30 mg/dL  Nitrite (UA) Negative  Leukocyte Esterase (UA) Negative (Result(s) reported on 28 Oct 2012 at 07:21AM.)  RBC (UA) 5 /HPF  WBC (UA) 2 /HPF  Bacteria (UA) NONE SEEN  Epithelial Cells (UA) NONE SEEN  Mucous (UA) PRESENT (Result(s) reported on 28 Oct 2012 at 07:21AM.)  Routine Hem:  20-Sep-14 03:02   WBC (CBC) 5.9  RBC (CBC)  3.33  Hemoglobin (CBC)  9.9  Hematocrit (CBC)  29.1  Platelet Count (CBC)  102  MCV 87  MCH 29.7  MCHC 34.0  RDW 13.5  Neutrophil % 76.0  Lymphocyte % 9.3  Monocyte % 13.0  Eosinophil % 1.4  Basophil % 0.3  Neutrophil # 4.5  Lymphocyte #  0.6  Monocyte # 0.8  Eosinophil # 0.1  Basophil # 0.0 (Result(s) reported on 28 Oct 2012 at 03:40AM.)   Assessment/Plan:  Assessment/Plan:  Assessment PBC   Plan The aptient has a stent in place with decreased LFT's and no increased WBC's. His temp is around 99. Will continue to follow.   Electronic Signatures: WLucilla Lame(MD)  (Signed 20-Sep-14 12:31)  Authored: Chief Complaint, VITAL SIGNS/ANCILLARY NOTES, Brief Assessment, Lab Results, Assessment/Plan   Last Updated: 20-Sep-14 12:31 by WLucilla Lame(MD)

## 2014-05-31 NOTE — H&P (Signed)
PATIENT NAME:  Jesse Thompson, Jesse Thompson MR#:  [id number] DATE OF BIRTH:  August 09, 1950  DATE OF ADMISSION:  11/28/2012  CHIEF COMPLAINT: Abdominal pain.   HISTORY OF PRESENT ILLNESS: A 64 year old male with history of primary sclerosing cholangitis, with 2 recent admissions for cholangitis, abdominal pain. On his initial admission, he had klebsiella bacteremia, with Klebsiella enterococcus coming out of the biliary cultures. He underwent percutaneous cholecystostomy draining which was able to be removed during his hospitalization. The second hospitalization appeared to be mainly cholangitis, responded to IV fluids, antibiotics and pain control. He developed increasing abdominal discomfort with low-grade fevers earlier today. He tried to take oral pain medication and antibiotics; however, he vomited this back up and so he presented to the office for further evaluation. On examination, is found to have epigastric pain, temperature 99.2. He is admitted now with what appears to be recurrent cholangitis.   PAST MEDICAL HISTORY: 1.  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  2.  Adult-onset diabetes mellitus.  3.  Hypothyroidism.  4.  Gout.  5.  Hypertension.  6.  Hyperlipidemia.  7.  Arthritis.  8.  History of dysphagia, requiring prior esophageal dilatation.  9.  History of right ankle surgery.   ALLERGIES: PENICILLIN AND FLAGYL.   MEDICATIONS: 1.  Metformin 500 mg p.o. b.i.d.  2. Hydrochlorothiazide 25 mg p.o. daily.  3.  Lisinopril 40 mg p.o. daily.  4.  Levothyroxine 0.088 mg p.o. daily.  5.  Famotidine 20 mg p.o. at bedtime.  6.  Allopurinol 100 mg p.o. daily.  7.  Omeprazole 40 mg p.o. daily p.r.n.  8.  Lodine 500 mg p.o. b.i.d. p.r.n. arthritis pain.  9.  Lipitor 10 mg p.o. at bedtime.    SOCIAL HISTORY: No tobacco or alcohol.   FAMILY HISTORY: Sister with brain tumor in her 79s. Multiple family members with coronary artery disease.   REVIEW OF SYSTEMS:  Please see history of present illness. Some bilious vomiting earlier today. No change in bowels, including no change in bowel color. No bright blood per rectum or melena. No urinary symptoms. Remainder of complete review of systems is negative.   PHYSICAL EXAMINATION: VITAL SIGNS: Blood pressure 120/60, temperature 98.2, pulse 60, saturation 98% on room air.  GENERAL: Well-developed, well-nourished male, appears ill.  EYES: Pupils round and reactive to light. Lids and conjunctivae are unremarkable.  EARS, NOSE AND THROAT: External examination unremarkable. The oropharynx is slightly dry without lesions.  NECK: Supple, trachea midline. No thyromegaly.  CARDIOVASCULAR: Regular rate and rhythm without gallops or rubs. Carotid and radial pulses 2+.  LUNGS: Clear bilaterally without wheeze or retraction.  ABDOMEN: Soft, with very quiet bowel sounds. Tenderness to deep palpation in the epigastric region without guarding, rebound, Murphy's.  SKIN:  Slightly jaundiced-appearing.  LYMPH NODES: No cervical or supraclavicular nodes.  MUSCULOSKELETAL: No clubbing or cyanosis. No edema.  NEUROLOGICAL EXAMINATION:  Cranial nerves intact.  Motor and sensory exams intact and symmetrical.   IMPRESSION AND PLAN: 1.  Abdominal pain/probable cholangitis. Admit and placed on IV meropenem, IV fluids, and IV pain medicines. Pantoprazole as needed and continue to address bowel regimen as needed. GI consultation. Ultrasound initially and consideration for CT scan. Await Met-C, CBC. Obtain blood cultures.  2.  Diabetes mellitus. Hold all medications and cover sliding scale insulin.  3.  Hypertension: Hold blood pressure medications for now and reinstitute as needed.      ____________________________ Adin Hector, MD bjk:cs D: 11/28/2012 15:30:01 ET T: 11/28/2012 15:44:00  ET JOB#: [id number]  cc: Adin Hector, MD, <Dictator> Ramonita Lab MD ELECTRONICALLY SIGNED 12/01/2012 12:37

## 2014-05-31 NOTE — Consult Note (Signed)
PATIENT NAME:  Jesse Thompson, Jesse Thompson MR#:  [id number] DATE OF BIRTH:  1950/10/20  DATE OF CONSULTATION:  11/13/2012  REFERRING PHYSICIAN:   CONSULTING PHYSICIAN:  Robert Bellow, MD  INDICATION FOR CONSULTATION: Abdominal pain, abnormal liver function studies.   CLINICAL NOTE: This 64 year old male was recently admitted with an episode of abdominal pain 2 weeks post ERCP. Evaluation at that time showed air in the gallbladder and subsequent percutaneous cholecystostomy showed enterococcus. He was treated with appropriate antibiotics and did well. He completed his course of antibiotics about 3 days prior to admission. He developed recurrent abdominal pain and was admitted on November 11, 2012. He was noted to have moderate elevation of his liver function studies, including transaminases and bilirubin. Surgical consultation was requested in regards to the advisability of elective cholecystectomy.   The patient has a history of idiopathic primary sclerosing cholangitis. ERCP was completed to evaluate a suspected stricture.   Examination completed on August 7th showed the patient to be awake, alert and orientated. Mild scleral icterus was noted. Chest was clear to auscultation. Cardiac exam showed a regular rhythm. The abdomen was scaphoid. Normal bowel sounds. No bruits. No tenderness. No evidence of hepatomegaly. No inguinal adenopathy. No peripheral edema.   Abdominal ultrasound completed on admission had suggested gallbladder wall thickening. No stones or sludge. Intrahepatic biliary dilatation was noted.   MRCP of the abdomen showed no evidence of cholecystitis. Mild focal intrahepatic biliary dilatation of the right lobe was appreciated with some stricturing.   Laboratory studies from admission showed the patient be afebrile with a normal white count and differential. Bilirubin on admission was 2 rising to a level of 3.6 and falling to 1.1 the day of consultation. There was a direct  hyperbilirubinemia. Mild depression of the serum albumin was noted. Mild hyperglycemia with an admission value of 159.   The patient reports that he had been experiencing abdominal pain the morning of October 4th prior to a breakfast of a sausage biscuit. While the history would suggest a biliary source, considering his known primary sclerosing cholangitis and a normal gallbladder on MRCP, I think that this coupled with his benign clinical exam suggests that this is more an intrahepatic bile duct process right than cholecystitis.   The patient had grown enterococcus several weeks ago, a very unusual pathogen for the biliary tree. The potential for an occult colonic malignancy was discussed with the treating gastroenterologist and it will be his determination of when a colonoscopy should be completed.   At this time, I do not see a gallbladder source for his episodic pain. Without identification of stones on multiple studies, and nontender clinical exam today, and the MRCP suggesting right hepatic stenosis, elective cholecystectomy is not felt warranted.   This information has been discussed with the attending physician, Ramonita Lab MD, the gastroenterologist, Verdie Shire MD, as well as the patient and his wife. ____________________________ Robert Bellow, MD jwb:sb D: 11/14/2012 16:19:07 ET T: 11/14/2012 16:58:33 ET JOB#: [id number]  cc: Robert Bellow, MD, <Dictator> Adin Hector, MD Lupita Dawn. Candace Cruise, MD Malon Branton Amedeo Kinsman MD ELECTRONICALLY SIGNED 11/15/2012 8:14

## 2014-05-31 NOTE — Discharge Summary (Signed)
PATIENT NAME:  Jesse Thompson, Jesse Thompson MR#:  [id number] DATE OF BIRTH:  December 25, 1950  DATE OF ADMISSION:  10/24/2012 DATE OF DISCHARGE:  10/31/2012  FINAL DIAGNOSES:  1.  Cholangitis.  2.  Klebsiella bacteremia and sepsis secondary to #1.  3.  Biliary obstruction secondary to #1.  4.  Adult onset diabetes mellitus.  5.  Gout.  6.  Hypothyroidism.  7.  Bradycardia.  8.  Hypertension.   HISTORY AND PHYSICAL: Please see dictated admission history and physical.   [place]: The patient was admitted with evidence of sepsis, rising liver enzymes and cholangitis. He was initially placed on Invanz, but continued to spike fevers through this. Coverage was broadened to include vancomycin and ciprofloxacin. Blood cultures were positive for Klebsiella, which was sensitive to fluoroquinolones. Surgery and GI saw the patient and he underwent percutaneous biliary drain placement. Cultures from this revealed enterococcus and a gram-negative rod. The enterococcus was sensitive to all antibiotics tested. He defervesced after placement of the drain and his liver enzymes returned to normal. Infectious disease was consulted by phone, and the enterococcus was found to be sensitive to fluoroquinolones. He was changed over to Avelox, which he tolerated. He underwent cholangiogram through the drain, which revealed that the biliary obstruction had resolved. Surgery anticipates pulling the biliary drain with plan then to have the patient discharged to home in stable condition with his physical activity to be up as tolerated. We anticipate no heavy lifting for the next 2 to 3 days and he may return to work after 1 week. He will follow up in our office in the next 2 to 4 weeks. He may remove the dressing tomorrow and may shower.   DISCHARGE MEDICATIONS:  1.  Metformin 500 mg p.o. b.i.d.  2.  Allopurinol 100 mg p.o. daily.  3.  Hydrochlorothiazide 25 mg p.o. daily.  4.  Lisinopril 40 mg p.o. daily.  5.  Amlodipine  10 mg p.o. daily.  6.  Famotidine 20 mg p.o. daily.  7.  Levothyroxine 0.088 mg p.o. daily.  8.  Avelox 400 mg p.o. daily x 10 days.  9.  He was to stop the Lopressor due to bradycardia.   DISCHARGE DIET: He should follow a low sodium diet.  ____________________________ Adin Hector, MD bjk:aw D: 10/31/2012 08:12:18 ET T: 10/31/2012 08:33:53 ET JOB#: [id number]  cc: Adin Hector, MD, <Dictator> Ramonita Lab MD ELECTRONICALLY SIGNED 11/08/2012 13:07

## 2014-06-19 ENCOUNTER — Other Ambulatory Visit: Payer: Self-pay

## 2014-06-19 DIAGNOSIS — M1990 Unspecified osteoarthritis, unspecified site: Secondary | ICD-10-CM | POA: Insufficient documentation

## 2014-06-19 NOTE — Patient Outreach (Signed)
[facility]) Care Management  06/19/2014  BENARD MINTURN [birth year] [id number]   Spoke to Cornelia Copa on the phone regarding his diabetes.  He is only sporadically checking his blood sugars . A1C increased from 6.7 to 6.8%.  He states he only eats "Nabs" for breakfast and lunch and denies sweet tea, regular sodas and candy bars. He is taking his Metformin as ordered. Does not exercise, works 7 days a week.  I have asked him to schedule his next visit with you. He will no longer be eligible for the Link to Wellness benefit of diabetes support/education after Jul 05, 2014 because his wife will drop her hours and this will make him ineligible for the program.  Medications Reviewed Today    Reviewed by Pollyann Glen, RN (Registered Nurse) on 06/19/14 at [place] List Status: <None>   Medication Order Taking? Sig Documenting Provider Last Dose Status Informant   allopurinol (ZYLOPRIM) 100 MG tablet [id number] Yes Take 1 tablet by mouth once a day to prevent gout Historical Provider, MD  Active              Med Note Zachary George, Vernona Rieger Jun 19, 2014  4:32 PM): Received from: [place]    famotidine (PEPCID) 20 MG tablet [id number] Yes TAKE ONE TABLET BY MOUTH AT BEDTIME Historical Provider, MD  Active              Med Note Zachary George, Vernona Rieger Jun 19, 2014  4:32 PM): Received from: [place]    hydrochlorothiazide (HYDRODIURIL) 25 MG tablet [id number] Yes TAKE ONE TABLET BY MOUTH DAILY Historical Provider, MD  Active              Med Note Vanetta Mulders Jun 19, 2014  4:32 PM): Received from: [place]    levothyroxine (SYNTHROID, LEVOTHROID) 88 MCG tablet [id number] Yes Take 1 tablet by mouth once a day Historical Provider, MD  Active              Med Note Zachary George, Vernona Rieger Jun 19, 2014  4:32 PM): Received from: [place]    lisinopril (PRINIVIL,ZESTRIL) 40 MG  tablet [id number] Yes Take 1 tablet by mouth once a day Historical Provider, MD  Active              Med Note Zachary George, Vernona Rieger Jun 19, 2014  4:32 PM): Received from: [place]    metFORMIN ([place]) 500 MG tablet [id number] Yes TAKE 1 TABLET BY MOUTH TWICE A DAY WITH MEALS Historical Provider, MD  Active              Med Note Vanetta Mulders Jun 19, 2014  4:32 PM): Received from: [place]    omeprazole ([place]) 40 MG capsule [id number] Yes TAKE ONE CAPSULE BY MOUTH DAILY Historical Provider, MD  Active              Med Note Zachary George, Vernona Rieger Jun 19, 2014  4:32 PM): Received from: [place]    sildenafil (VIAGRA) 100 MG tablet [id number] Yes Take 100 mg by mouth daily as needed. Historical Provider, MD  Active            [facility] CM Care Plan Problem One        Patient  Outreach Telephone from 06/19/2014 in [place] Problem One  Potential for elevated a1C    Care Plan for Problem One  Active   [facility] Long Term Goal (31-90 days)  Patient will continue to see MD as directed since patient will no longer be assessed through [facility]'s Link to Wellness when his wife moves to part-time status and the lose this benefit.    [facility] Long Term Goal Start Date  06/19/14   Interventions for Problem One Long Term Goal  - schedule follow up visit with Dr. Caryl Comes       Thank you,   Gentry Fitz, RN, BA, [place], [place]:  [phone]  Fax:  [phone] E-mail: [email or web address] [address], [place], [place]  [address]

## 2014-07-12 IMAGING — US ABDOMEN ULTRASOUND LIMITED
1 series · 14 of 25 positions shown · non-contrast
Comparison: none

REASON FOR EXAM: hx of Primary sclerosing cholangitis, ruq pain
COMMENTS:   Body Site: GB and Fossa, CBD, Head of Pancreas

[Series 1: abdomen ultrasound limited · 0.32mm/px · 14 of 33 slices shown]
[im 1/33]
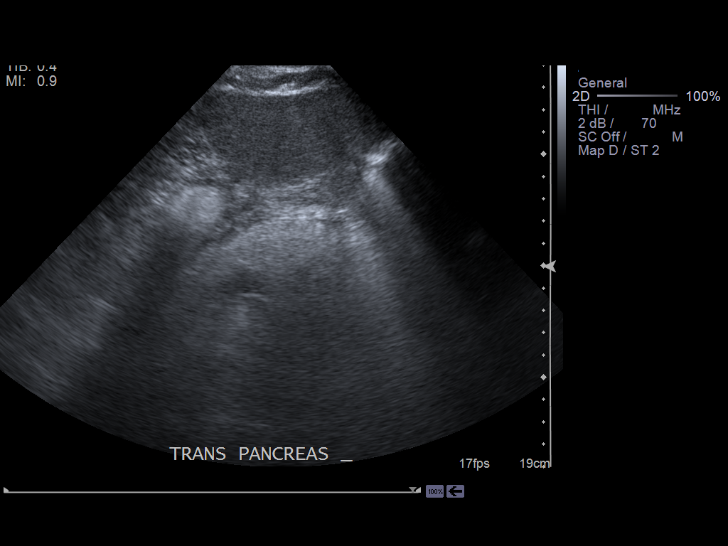
[im 3/33]
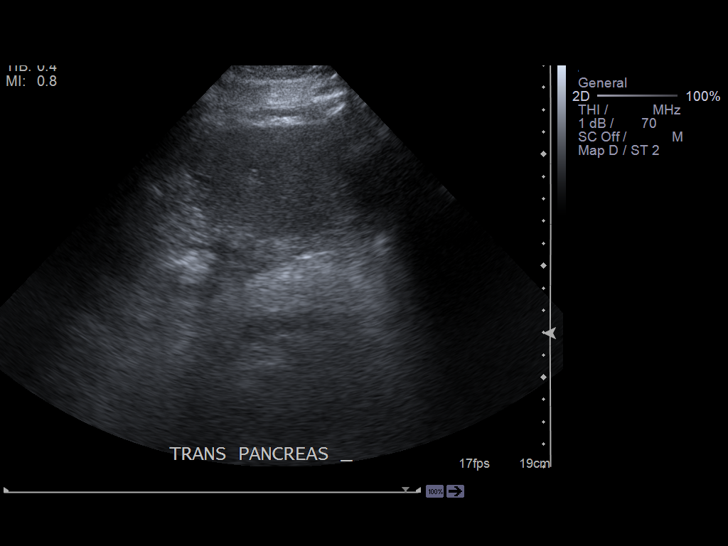
[im 6/33]
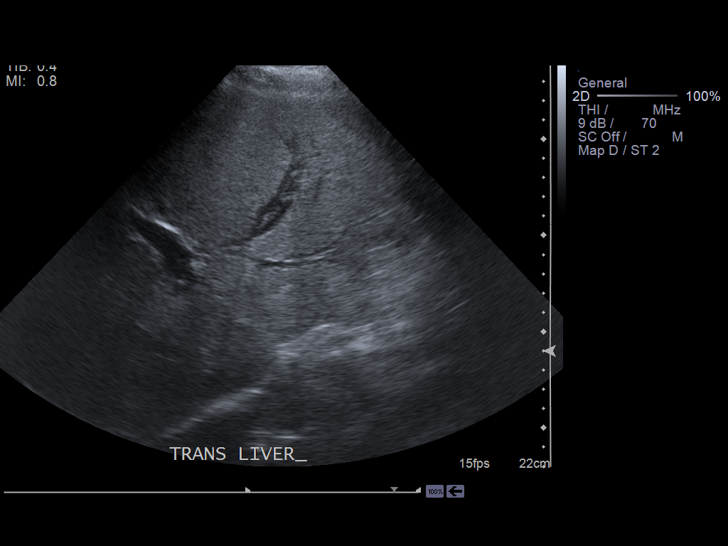
[im 9/33]
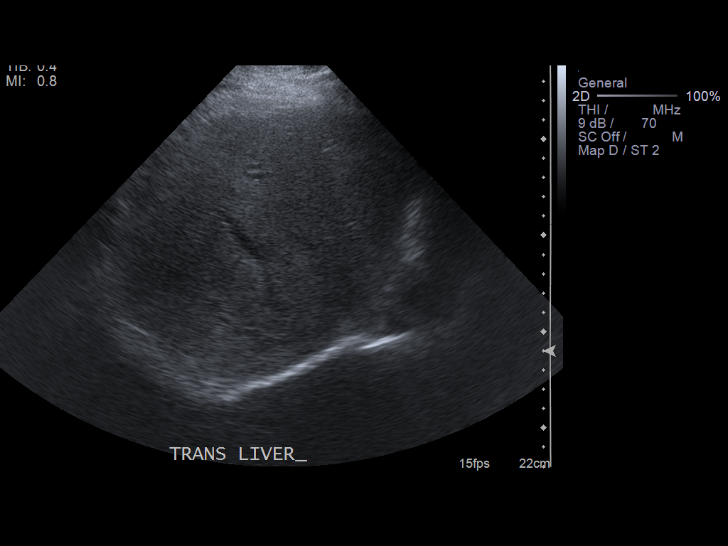
[im 11/33]
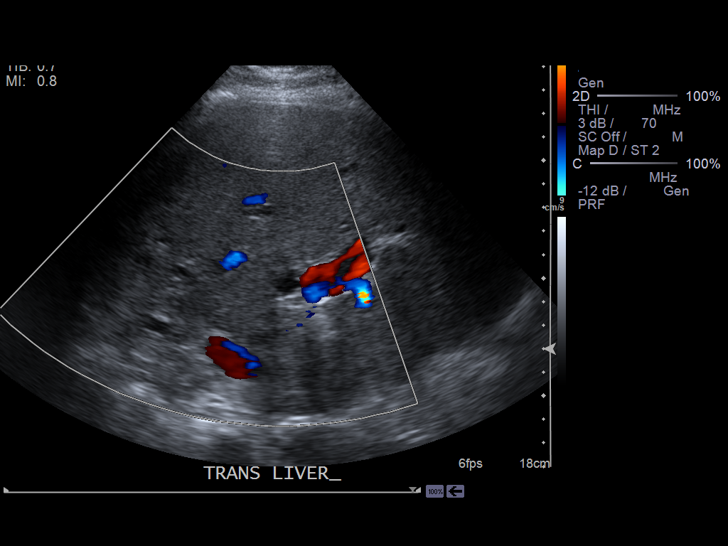
[im 13/33]
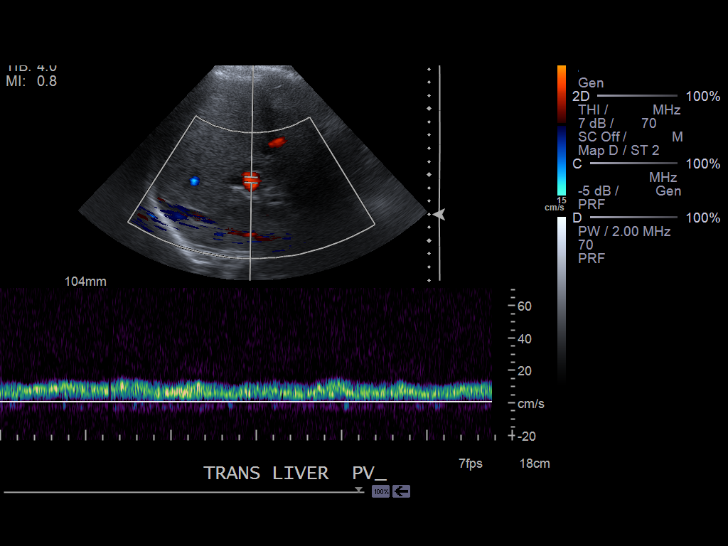
[im 15/33]
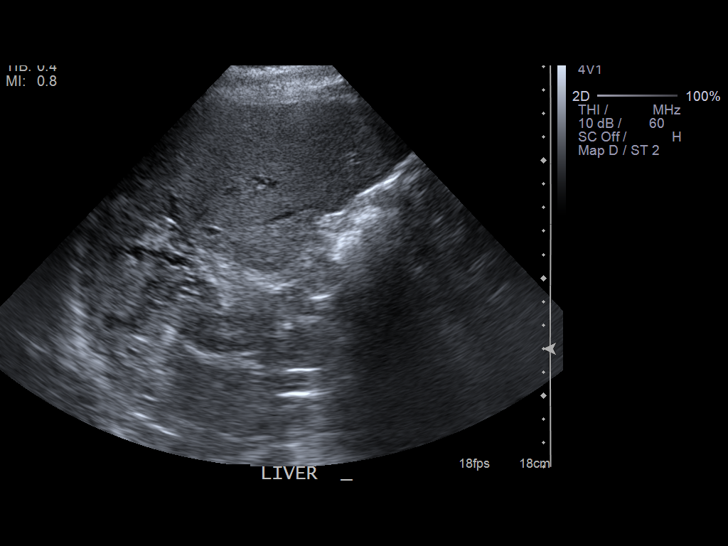
[im 18/33]
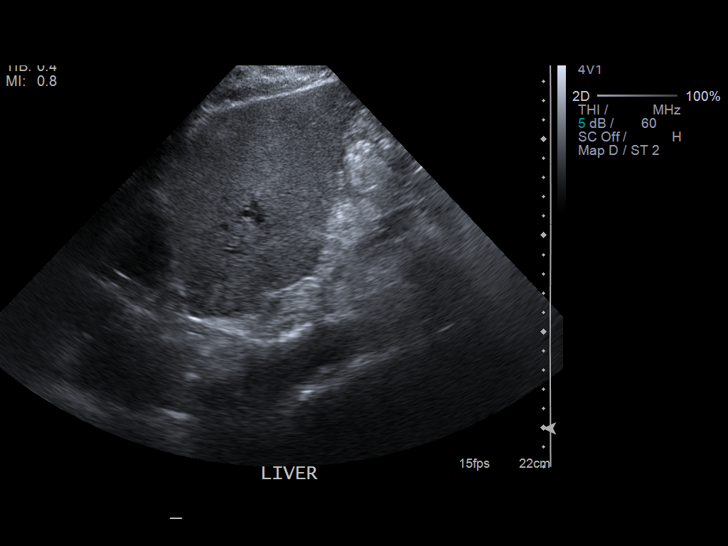
[im 21/33]
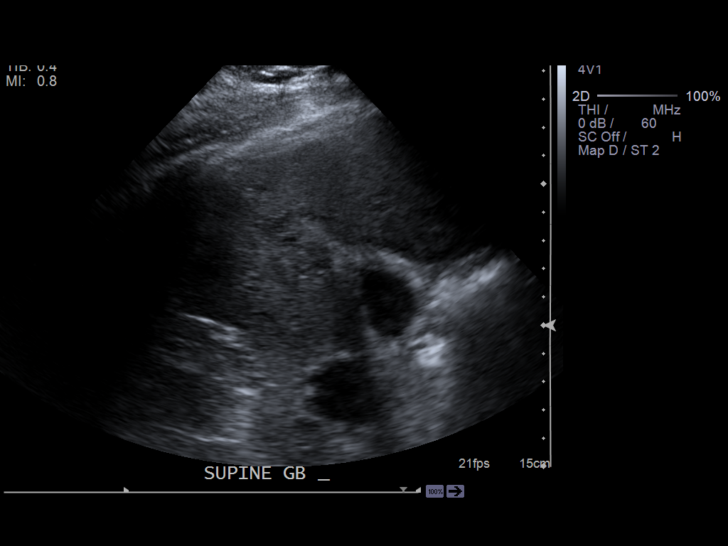
[im 22/33]
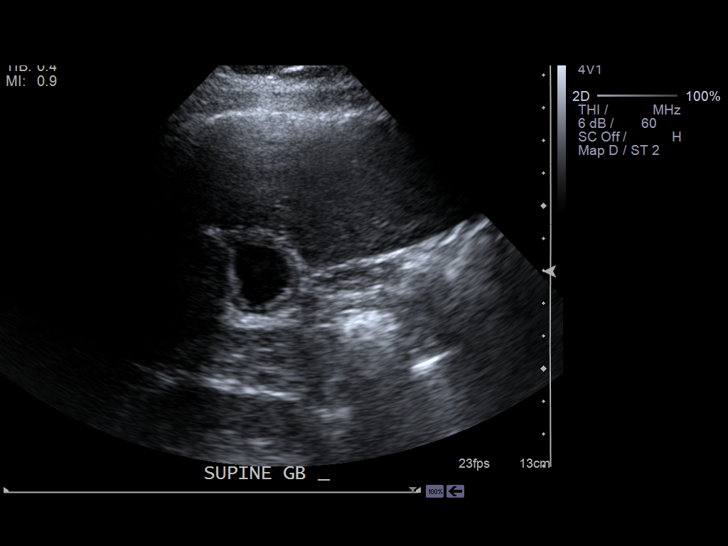
[im 25/33]
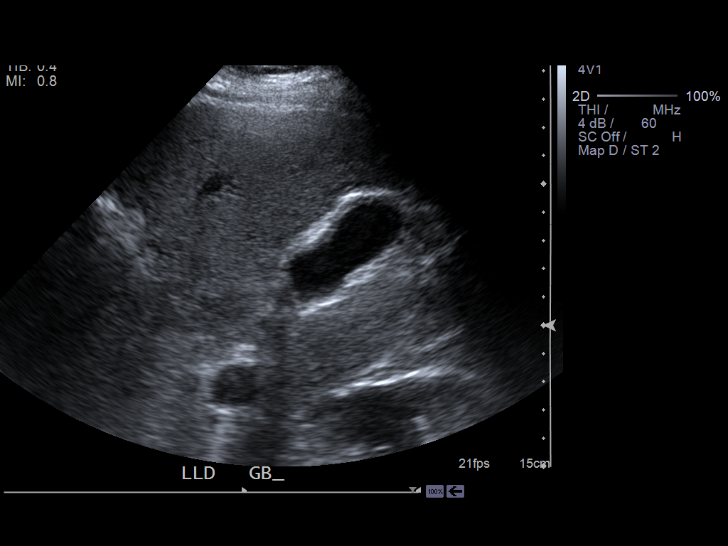
[im 27/33]
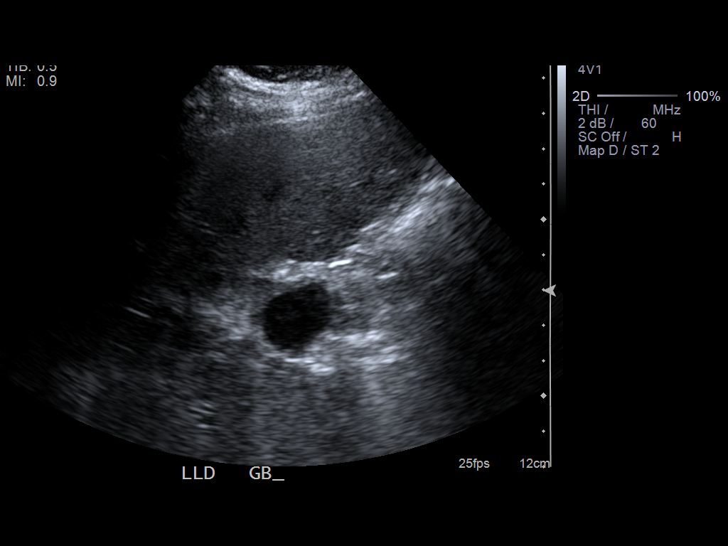
[im 30/33]
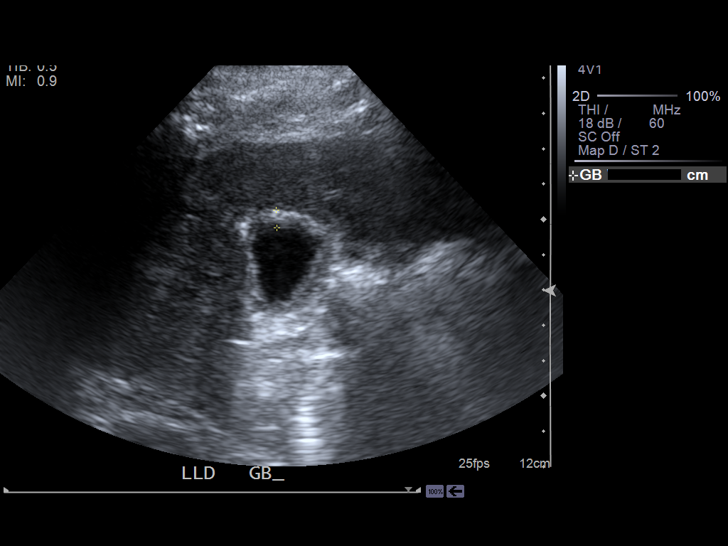
[im 33/33]
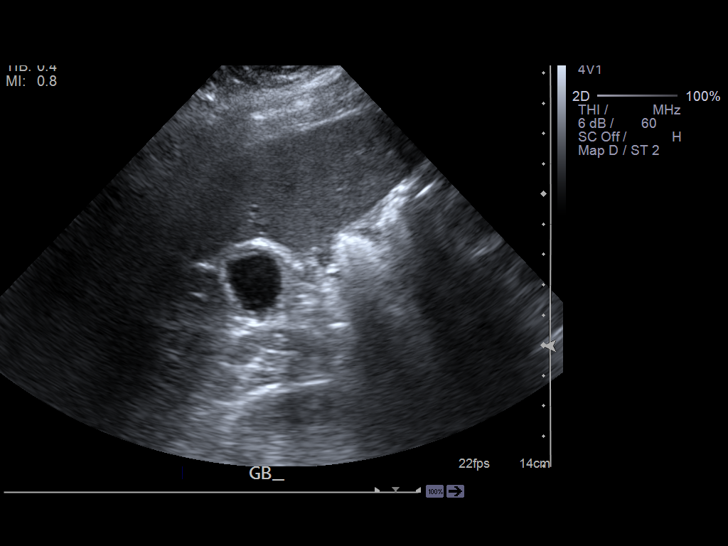

[14 of 25 positions shown; findings below may reference images not displayed]

PROCEDURE:     US  - US ABDOMEN LIMITED SURVEY  - November 11, 2012  [DATE]

RESULT:     The liver exhibits intrahepatic ductal dilation. It is not
enlarged measuring 16.1 cm in the midclavicular line. Its echotexture is
increased. Portal venous flow is normal in direction toward the liver. The
pancreas is partially obscured by bowel gas but the visualized portions
appear normal. The gallbladder is adequately distended with no evidence of
stones or sludge. The gallbladder wall remains thickened at 5.1 mm. There is
no positive sonographic Murphy's sign. The common bile duct measures 3.2 mm
in diameter.
IMPRESSION: 1. There is persistent gallbladder wall thickening but no stones or sludge
or pericholecystic fluid are demonstrated and there is no positive
sonographic Murphy's sign.
2. There is intrahepatic ductal dilation and increased echotexture of the
liver but there is no focal mass.
3. Evaluation the pancreas is limited by bowel gas.

A preliminary report was sent to the floor at the conclusion of the study.

[REDACTED]

## 2014-08-13 NOTE — Patient Outreach (Signed)
[facility]) Care Management  08/13/2014  Jesse Thompson 1951-01-26 [id number]   Jesse Thompson has decreased employment hours at [employer] and will no longer receive benefits through the organization as of Jul 05, 2014.  She and her husband, Jesse Thompson who was on her prescription plan are no longer eligible for the Link to Well pharmacy benefit through the program. I am discharging Jesse Thompson from the program.     Gentry Fitz, RN, [place], [place], [place]:  [phone]  Fax:  [phone] E-mail: [email or web address] [address], [place], [place]  [address]

## 2015-06-04 ENCOUNTER — Other Ambulatory Visit: Payer: Self-pay | Admitting: Internal Medicine

## 2015-06-04 DIAGNOSIS — R7989 Other specified abnormal findings of blood chemistry: Secondary | ICD-10-CM

## 2015-06-04 DIAGNOSIS — R945 Abnormal results of liver function studies: Principal | ICD-10-CM

## 2015-06-16 ENCOUNTER — Ambulatory Visit
Admission: RE | Admit: 2015-06-16 | Discharge: 2015-06-16 | Disposition: A | Discharge status: Home / Self Care | Payer: Medicare Other | Source: Ambulatory Visit | Attending: Internal Medicine | Admitting: Internal Medicine

## 2015-06-16 DIAGNOSIS — R945 Abnormal results of liver function studies: Secondary | ICD-10-CM | POA: Diagnosis present

## 2015-06-16 DIAGNOSIS — R932 Abnormal findings on diagnostic imaging of liver and biliary tract: Secondary | ICD-10-CM | POA: Diagnosis not present

## 2015-06-16 DIAGNOSIS — N281 Cyst of kidney, acquired: Secondary | ICD-10-CM | POA: Diagnosis not present

## 2015-06-16 DIAGNOSIS — R7989 Other specified abnormal findings of blood chemistry: Secondary | ICD-10-CM

## 2015-06-16 DIAGNOSIS — K760 Fatty (change of) liver, not elsewhere classified: Secondary | ICD-10-CM | POA: Insufficient documentation

## 2015-06-17 ENCOUNTER — Other Ambulatory Visit: Payer: Self-pay | Admitting: Internal Medicine

## 2015-06-17 DIAGNOSIS — K769 Liver disease, unspecified: Secondary | ICD-10-CM

## 2015-07-04 ENCOUNTER — Ambulatory Visit
Admission: RE | Admit: 2015-07-04 | Discharge: 2015-07-04 | Disposition: A | Discharge status: Home / Self Care | Payer: Medicare Other | Source: Ambulatory Visit | Attending: Internal Medicine | Admitting: Internal Medicine

## 2015-07-04 DIAGNOSIS — K7689 Other specified diseases of liver: Secondary | ICD-10-CM | POA: Diagnosis present

## 2015-07-04 DIAGNOSIS — K769 Liver disease, unspecified: Secondary | ICD-10-CM

## 2015-07-04 MED ORDER — GADOBENATE DIMEGLUMINE 529 MG/ML IV SOLN
20.0000 mL | Freq: Once | INTRAVENOUS | Status: AC | PRN
  Administered 2015-07-04: 19 mL via INTRAVENOUS

## 2015-09-09 ENCOUNTER — Other Ambulatory Visit: Payer: Self-pay | Admitting: Student

## 2015-09-09 DIAGNOSIS — R932 Abnormal findings on diagnostic imaging of liver and biliary tract: Secondary | ICD-10-CM

## 2015-09-09 DIAGNOSIS — K769 Liver disease, unspecified: Secondary | ICD-10-CM

## 2015-09-09 DIAGNOSIS — K8301 Primary sclerosing cholangitis: Secondary | ICD-10-CM

## 2015-09-22 ENCOUNTER — Other Ambulatory Visit: Payer: Self-pay | Admitting: Student

## 2015-09-22 ENCOUNTER — Ambulatory Visit
Admission: RE | Admit: 2015-09-22 | Discharge: 2015-09-22 | Disposition: A | Discharge status: Home / Self Care | Payer: Medicare Other | Source: Ambulatory Visit | Attending: Student | Admitting: Student

## 2015-09-22 DIAGNOSIS — K760 Fatty (change of) liver, not elsewhere classified: Secondary | ICD-10-CM | POA: Insufficient documentation

## 2015-09-22 DIAGNOSIS — K769 Liver disease, unspecified: Secondary | ICD-10-CM | POA: Diagnosis present

## 2015-09-22 DIAGNOSIS — R932 Abnormal findings on diagnostic imaging of liver and biliary tract: Secondary | ICD-10-CM | POA: Diagnosis present

## 2015-09-22 DIAGNOSIS — K8301 Primary sclerosing cholangitis: Secondary | ICD-10-CM

## 2015-09-22 MED ORDER — GADOXETATE DISODIUM 0.25 MMOL/ML IV SOLN
10.0000 mL | Freq: Once | INTRAVENOUS | Status: AC | PRN
  Administered 2015-09-22: 9 mL via INTRAVENOUS

## 2015-12-26 HISTORY — PX: PARATHYROIDECTOMY / EXPLORATION OF PARATHYROIDS: SUR1002

## 2016-03-22 DIAGNOSIS — Z0000 Encounter for general adult medical examination without abnormal findings: Secondary | ICD-10-CM | POA: Diagnosis not present

## 2016-03-22 DIAGNOSIS — E119 Type 2 diabetes mellitus without complications: Secondary | ICD-10-CM | POA: Diagnosis not present

## 2016-03-22 DIAGNOSIS — Z125 Encounter for screening for malignant neoplasm of prostate: Secondary | ICD-10-CM | POA: Diagnosis not present

## 2016-03-22 DIAGNOSIS — G4733 Obstructive sleep apnea (adult) (pediatric): Secondary | ICD-10-CM | POA: Diagnosis not present

## 2016-03-22 DIAGNOSIS — I10 Essential (primary) hypertension: Secondary | ICD-10-CM | POA: Diagnosis not present

## 2016-03-22 DIAGNOSIS — M1990 Unspecified osteoarthritis, unspecified site: Secondary | ICD-10-CM | POA: Diagnosis not present

## 2016-03-22 DIAGNOSIS — E890 Postprocedural hypothyroidism: Secondary | ICD-10-CM | POA: Diagnosis not present

## 2016-03-22 DIAGNOSIS — M109 Gout, unspecified: Secondary | ICD-10-CM | POA: Diagnosis not present

## 2016-03-22 DIAGNOSIS — K830 Cholangitis: Secondary | ICD-10-CM | POA: Diagnosis not present

## 2016-06-16 DIAGNOSIS — G5603 Carpal tunnel syndrome, bilateral upper limbs: Secondary | ICD-10-CM | POA: Diagnosis not present

## 2016-06-28 DIAGNOSIS — E119 Type 2 diabetes mellitus without complications: Secondary | ICD-10-CM | POA: Diagnosis not present

## 2016-06-28 DIAGNOSIS — G4733 Obstructive sleep apnea (adult) (pediatric): Secondary | ICD-10-CM | POA: Diagnosis not present

## 2016-06-28 DIAGNOSIS — Z8639 Personal history of other endocrine, nutritional and metabolic disease: Secondary | ICD-10-CM | POA: Diagnosis not present

## 2016-06-28 DIAGNOSIS — M1990 Unspecified osteoarthritis, unspecified site: Secondary | ICD-10-CM | POA: Diagnosis not present

## 2016-06-28 DIAGNOSIS — K830 Cholangitis: Secondary | ICD-10-CM | POA: Diagnosis not present

## 2016-06-28 DIAGNOSIS — M109 Gout, unspecified: Secondary | ICD-10-CM | POA: Diagnosis not present

## 2016-06-28 DIAGNOSIS — E784 Other hyperlipidemia: Secondary | ICD-10-CM | POA: Diagnosis not present

## 2016-06-28 DIAGNOSIS — E890 Postprocedural hypothyroidism: Secondary | ICD-10-CM | POA: Diagnosis not present

## 2016-06-28 DIAGNOSIS — I10 Essential (primary) hypertension: Secondary | ICD-10-CM | POA: Diagnosis not present

## 2016-08-23 DIAGNOSIS — G5601 Carpal tunnel syndrome, right upper limb: Secondary | ICD-10-CM | POA: Diagnosis not present

## 2016-08-25 ENCOUNTER — Other Ambulatory Visit: Payer: Self-pay | Admitting: Specialist

## 2016-09-06 ENCOUNTER — Ambulatory Visit
Admission: RE | Admit: 2016-09-06 | Discharge: 2016-09-06 | Disposition: A | Discharge status: Home / Self Care | Payer: Medicare HMO | Source: Ambulatory Visit | Attending: Specialist | Admitting: Specialist

## 2016-09-06 DIAGNOSIS — I10 Essential (primary) hypertension: Secondary | ICD-10-CM | POA: Insufficient documentation

## 2016-09-06 DIAGNOSIS — Z01810 Encounter for preprocedural cardiovascular examination: Secondary | ICD-10-CM | POA: Insufficient documentation

## 2016-09-06 DIAGNOSIS — Z01812 Encounter for preprocedural laboratory examination: Secondary | ICD-10-CM | POA: Insufficient documentation

## 2016-09-06 HISTORY — DX: Gout, unspecified: M10.9

## 2016-09-06 HISTORY — DX: Gastro-esophageal reflux disease without esophagitis: K21.9

## 2016-09-06 HISTORY — DX: Dizziness and giddiness: R42

## 2016-09-06 HISTORY — DX: Unspecified hearing loss, unspecified ear: H91.90

## 2016-09-06 LAB — CBC
HEMATOCRIT: 39.7 % — AB (ref 40.0–52.0)
HEMOGLOBIN: 12.9 g/dL — AB (ref 13.0–18.0)
MCH: 27.1 pg (ref 26.0–34.0)
MCHC: 32.6 g/dL (ref 32.0–36.0)
MCV: 83.3 fL (ref 80.0–100.0)
Platelets: 133 10*3/uL — ABNORMAL LOW (ref 150–440)
RBC: 4.76 MIL/uL (ref 4.40–5.90)
RDW: 16.1 % — ABNORMAL HIGH (ref 11.5–14.5)
WBC: 4.9 10*3/uL (ref 3.8–10.6)

## 2016-09-06 LAB — BASIC METABOLIC PANEL
Anion gap: 10 (ref 5–15)
BUN: 21 mg/dL — AB (ref 6–20)
CHLORIDE: 108 mmol/L (ref 101–111)
CO2: 25 mmol/L (ref 22–32)
CREATININE: 1.05 mg/dL (ref 0.61–1.24)
Calcium: 9.4 mg/dL (ref 8.9–10.3)
GFR calc Af Amer: >60 mL/min (ref >60)
GFR calc non Af Amer: >60 mL/min (ref >60)
Glucose, Bld: 117 mg/dL — ABNORMAL HIGH (ref 65–99)
Potassium: 3.7 mmol/L (ref 3.5–5.1)
Sodium: 143 mmol/L (ref 135–145)

## 2016-09-06 NOTE — Patient Instructions (Signed)
Your procedure is scheduled on: September 15, 2016 [facility] ) Report to Same Day Surgery 2nd floor medical mall [facility] Entrance-take elevator on left to 2nd floor.  Check in with surgery information desk.) To find out your arrival time please call [phone] between 1PM - 3PM on September 14, 2016 (TUESDAY ) Remember: Instructions that are not followed completely may result in serious medical risk, up to and including death, or upon the discretion of your surgeon and anesthesiologist your surgery may need to be rescheduled.    _x___ 1. Do not eat food or drink liquids after midnight. No gum chewing or  hard candies                           .     __x__ 2. No Alcohol for 24 hours before or after surgery.   __x__3. No Smoking for 24 prior to surgery.   ____  4. Bring all medications with you on the day of surgery if instructed.    __x__ 5. Notify your doctor if there is any change in your medical condition     (cold, fever, infections).     Do not wear jewelry, make-up, hairpins, clips or nail polish.  Do not wear lotions, powders, or perfumes.  Do not shave 48 hours prior to surgery. Men may shave face and neck.  Do not bring valuables to the hospital.    [facility] is not responsible for any belongings or valuables.               Contacts, dentures or bridgework may not be worn into surgery.  Leave your suitcase in the car. After surgery it may be brought to your room.  For patients admitted to the hospital, discharge time is determined by your  treatment team                       Patients discharged the day of surgery will not be allowed to drive home.  You will need someone to drive you home and stay with you the night of your procedure.    Please read over the following fact sheets that you were given:   [facility] Preparing for Surgery and or MRSA Information   TAKE THE FOLLOWING MEDICATIONS THE MORNING OF SURGERY WITH A SIP OF WATER  :  1.AMLODIPINE  2.OMEPRAZOLE  3.HYDRALAZINE  4.LEVOTHYROXINE  5.  6.  ____Fleets enema or Magnesium Citrate as directed.   _x___ Use CHG Soap or sage wipes as directed on instruction sheet   ____ Use inhalers on the day of surgery and bring to hospital day of surgery  _X__ Stop Metformin and Janumet 2 days prior to surgery. (STOP METFORMIN ON AUGUST 6 )   ____ Take 1/2 of usual insulin dose the night before surgery and none on the morning surgery        _x___ Follow recommendations from Cardiologist, Pulmonologist or PCP regarding          stopping Aspirin, Coumadin, Plavix ,Eliquis, Effient, or Pradaxa, and Pletal.  X____Stop Anti-inflammatories such as Advil, Aleve, Ibuprofen, Motrin, Naproxen, Naprosyn, Goodies powders or aspirin products. (STOP MELOXICAM SEVEN DAYS PRIOR TO SURGERY )  OK to take Tylenol    _x___ Stop supplements until after surgery.  But may continue Vitamin D, Vitamin B, and multivitamin (STOP FISH OIL NOW  )      ____ Bring C-Pap to the hospital.

## 2016-09-07 NOTE — Pre-Procedure Instructions (Signed)
EKG COMPARED TO 2014

## 2016-09-14 MED ORDER — CLINDAMYCIN PHOSPHATE 600 MG/50ML IV SOLN
600.0000 mg | INTRAVENOUS | Status: AC
  Administered 2016-09-15: 600 mg via INTRAVENOUS
  Filled 2016-09-14: qty 50

## 2016-09-15 ENCOUNTER — Other Ambulatory Visit: Payer: Self-pay | Admitting: Specialist

## 2016-09-15 ENCOUNTER — Ambulatory Visit: Payer: Medicare HMO | Admitting: Anesthesiology

## 2016-09-15 ENCOUNTER — Ambulatory Visit
Admission: RE | Admit: 2016-09-15 | Discharge: 2016-09-15 | Disposition: A | Discharge status: Home / Self Care | Payer: Medicare HMO | Source: Ambulatory Visit | Attending: Specialist | Admitting: Specialist

## 2016-09-15 ENCOUNTER — Encounter
Admission: RE | Disposition: A | Discharge status: Home / Self Care | Payer: Self-pay | Source: Ambulatory Visit | Attending: Specialist

## 2016-09-15 ENCOUNTER — Encounter: Payer: Self-pay | Admitting: *Deleted

## 2016-09-15 DIAGNOSIS — E785 Hyperlipidemia, unspecified: Secondary | ICD-10-CM | POA: Insufficient documentation

## 2016-09-15 DIAGNOSIS — Z888 Allergy status to other drugs, medicaments and biological substances status: Secondary | ICD-10-CM | POA: Insufficient documentation

## 2016-09-15 DIAGNOSIS — K219 Gastro-esophageal reflux disease without esophagitis: Secondary | ICD-10-CM | POA: Insufficient documentation

## 2016-09-15 DIAGNOSIS — G5601 Carpal tunnel syndrome, right upper limb: Secondary | ICD-10-CM | POA: Insufficient documentation

## 2016-09-15 DIAGNOSIS — I10 Essential (primary) hypertension: Secondary | ICD-10-CM | POA: Insufficient documentation

## 2016-09-15 DIAGNOSIS — Z880 Allergy status to penicillin: Secondary | ICD-10-CM | POA: Insufficient documentation

## 2016-09-15 DIAGNOSIS — M1990 Unspecified osteoarthritis, unspecified site: Secondary | ICD-10-CM | POA: Diagnosis not present

## 2016-09-15 DIAGNOSIS — E119 Type 2 diabetes mellitus without complications: Secondary | ICD-10-CM | POA: Diagnosis not present

## 2016-09-15 DIAGNOSIS — Z7984 Long term (current) use of oral hypoglycemic drugs: Secondary | ICD-10-CM | POA: Insufficient documentation

## 2016-09-15 DIAGNOSIS — Z79899 Other long term (current) drug therapy: Secondary | ICD-10-CM | POA: Diagnosis not present

## 2016-09-15 DIAGNOSIS — M109 Gout, unspecified: Secondary | ICD-10-CM | POA: Diagnosis not present

## 2016-09-15 HISTORY — PX: CARPAL TUNNEL RELEASE: SHX101

## 2016-09-15 LAB — GLUCOSE, CAPILLARY
GLUCOSE-CAPILLARY: 117 mg/dL — AB (ref 65–99)
GLUCOSE-CAPILLARY: 148 mg/dL — AB (ref 65–99)

## 2016-09-15 SURGERY — CARPAL TUNNEL RELEASE
Anesthesia: General | Site: Hand | Laterality: Right | Wound class: Clean

## 2016-09-15 MED ORDER — MIDAZOLAM HCL 2 MG/2ML IJ SOLN
INTRAMUSCULAR | Status: DC | PRN
  Administered 2016-09-15 (×2): 1 mg via INTRAVENOUS

## 2016-09-15 MED ORDER — MELOXICAM 15 MG PO TABS: 15.0000 mg | ORAL_TABLET | Freq: Every day | ORAL | 3 refills | Status: DC

## 2016-09-15 MED ORDER — GLYCOPYRROLATE 0.2 MG/ML IJ SOLN
INTRAMUSCULAR | Status: AC
  Filled 2016-09-15: qty 1

## 2016-09-15 MED ORDER — PROPOFOL 10 MG/ML IV BOLUS
INTRAVENOUS | Status: AC
  Filled 2016-09-15: qty 20

## 2016-09-15 MED ORDER — CHLORHEXIDINE GLUCONATE CLOTH 2 % EX PADS: 6.0000 | MEDICATED_PAD | Freq: Once | CUTANEOUS | Status: DC

## 2016-09-15 MED ORDER — ONDANSETRON HCL 4 MG/2ML IJ SOLN
INTRAMUSCULAR | Status: DC | PRN
  Administered 2016-09-15: 4 mg via INTRAVENOUS

## 2016-09-15 MED ORDER — GABAPENTIN 400 MG PO CAPS: 400.0000 mg | ORAL_CAPSULE | Freq: Two times a day (BID) | ORAL | 3 refills | Status: AC

## 2016-09-15 MED ORDER — FENTANYL CITRATE (PF) 100 MCG/2ML IJ SOLN: 25.0000 ug | INTRAMUSCULAR | Status: DC | PRN

## 2016-09-15 MED ORDER — LIDOCAINE HCL (PF) 2 % IJ SOLN
INTRAMUSCULAR | Status: AC
  Filled 2016-09-15: qty 2

## 2016-09-15 MED ORDER — SODIUM CHLORIDE 0.9 % IV SOLN
INTRAVENOUS | Status: DC
  Administered 2016-09-15 (×2): via INTRAVENOUS

## 2016-09-15 MED ORDER — ONDANSETRON HCL 4 MG/2ML IJ SOLN
INTRAMUSCULAR | Status: AC
  Filled 2016-09-15: qty 2

## 2016-09-15 MED ORDER — BUPIVACAINE HCL 0.5 % IJ SOLN
INTRAMUSCULAR | Status: DC | PRN
  Administered 2016-09-15: 17 mL

## 2016-09-15 MED ORDER — MIDAZOLAM HCL 2 MG/2ML IJ SOLN
INTRAMUSCULAR | Status: AC
  Filled 2016-09-15: qty 2

## 2016-09-15 MED ORDER — PROPOFOL 10 MG/ML IV BOLUS
INTRAVENOUS | Status: DC | PRN
  Administered 2016-09-15: 150 mg via INTRAVENOUS

## 2016-09-15 MED ORDER — BUPIVACAINE HCL (PF) 0.5 % IJ SOLN
INTRAMUSCULAR | Status: AC
  Filled 2016-09-15: qty 30

## 2016-09-15 MED ORDER — LIDOCAINE HCL (CARDIAC) 20 MG/ML IV SOLN
INTRAVENOUS | Status: DC | PRN
  Administered 2016-09-15: 30 mg via INTRAVENOUS

## 2016-09-15 MED ORDER — LIDOCAINE HCL 2 % EX GEL
CUTANEOUS | Status: AC
  Filled 2016-09-15: qty 5

## 2016-09-15 MED ORDER — SODIUM CHLORIDE 0.9 % IJ SOLN
INTRAMUSCULAR | Status: AC
  Filled 2016-09-15: qty 10

## 2016-09-15 MED ORDER — CEFAZOLIN SODIUM-DEXTROSE 2-4 GM/100ML-% IV SOLN: 2.0000 g | INTRAVENOUS | Status: DC

## 2016-09-15 MED ORDER — FENTANYL CITRATE (PF) 100 MCG/2ML IJ SOLN
INTRAMUSCULAR | Status: AC
  Filled 2016-09-15: qty 2

## 2016-09-15 MED ORDER — FENTANYL CITRATE (PF) 100 MCG/2ML IJ SOLN
INTRAMUSCULAR | Status: DC | PRN
  Administered 2016-09-15 (×2): 50 ug via INTRAVENOUS

## 2016-09-15 MED ORDER — EPHEDRINE SULFATE 50 MG/ML IJ SOLN
INTRAMUSCULAR | Status: DC | PRN
  Administered 2016-09-15 (×2): 5 mg via INTRAVENOUS

## 2016-09-15 MED ORDER — HYDROCODONE-ACETAMINOPHEN 5-325 MG PO TABS: 1.0000 | ORAL_TABLET | Freq: Four times a day (QID) | ORAL | 0 refills | Status: DC | PRN

## 2016-09-15 MED ORDER — CEFAZOLIN SODIUM-DEXTROSE 2-3 GM-% IV SOLR
INTRAVENOUS | Status: DC | PRN
  Administered 2016-09-15: 2 g via INTRAVENOUS

## 2016-09-15 MED ORDER — CEFAZOLIN SODIUM-DEXTROSE 2-4 GM/100ML-% IV SOLN
INTRAVENOUS | Status: AC
  Filled 2016-09-15: qty 100

## 2016-09-15 MED ORDER — OXYCODONE HCL 5 MG/5ML PO SOLN: 5.0000 mg | Freq: Once | ORAL | Status: DC | PRN

## 2016-09-15 MED ORDER — OXYCODONE HCL 5 MG PO TABS: 5.0000 mg | ORAL_TABLET | Freq: Once | ORAL | Status: DC | PRN

## 2016-09-15 SURGICAL SUPPLY — 24 items
BLADE SURG MINI STRL (BLADE) ×3 IMPLANT
BNDG ESMARK 4X12 TAN STRL LF (GAUZE/BANDAGES/DRESSINGS) ×3 IMPLANT
CANISTER SUCT 1200ML W/VALVE (MISCELLANEOUS) ×3 IMPLANT
CHLORAPREP W/TINT 26ML (MISCELLANEOUS) ×3 IMPLANT
CUFF TOURN 18 STER (MISCELLANEOUS) IMPLANT
ELECT REM PT RETURN 9FT ADLT (ELECTROSURGICAL) ×3
ELECTRODE REM PT RTRN 9FT ADLT (ELECTROSURGICAL) ×1 IMPLANT
GAUZE FLUFF 18X24 1PLY STRL (GAUZE/BANDAGES/DRESSINGS) ×3 IMPLANT
GAUZE PETRO XEROFOAM 1X8 (MISCELLANEOUS) ×3 IMPLANT
GLOVE BIO SURGEON STRL SZ8 (GLOVE) ×3 IMPLANT
GOWN STRL REUS W/ TWL LRG LVL3 (GOWN DISPOSABLE) ×1 IMPLANT
GOWN STRL REUS W/TWL LRG LVL3 (GOWN DISPOSABLE) ×2
GOWN STRL REUS W/TWL LRG LVL4 (GOWN DISPOSABLE) ×3 IMPLANT
KIT RM TURNOVER STRD PROC AR (KITS) ×3 IMPLANT
NS IRRIG 500ML POUR BTL (IV SOLUTION) ×3 IMPLANT
PACK EXTREMITY ARMC (MISCELLANEOUS) ×3 IMPLANT
PAD PREP 24X41 OB/GYN DISP (PERSONAL CARE ITEMS) ×3 IMPLANT
SPLINT CAST 1 STEP 3X12 (MISCELLANEOUS) ×3 IMPLANT
STOCKINETTE BIAS CUT 4 980044 (GAUZE/BANDAGES/DRESSINGS) ×3 IMPLANT
STOCKINETTE STRL 4IN 9604848 (GAUZE/BANDAGES/DRESSINGS) ×3 IMPLANT
SUT ETHILON 4-0 (SUTURE) ×2
SUT ETHILON 4-0 FS2 18XMFL BLK (SUTURE) ×1
SUT ETHILON 5-0 FS-2 18 BLK (SUTURE) ×3 IMPLANT
SUTURE ETHLN 4-0 FS2 18XMF BLK (SUTURE) ×1 IMPLANT

## 2016-09-15 NOTE — Anesthesia Postprocedure Evaluation (Signed)
Anesthesia Post Note  Patient: Jesse Thompson  Procedure(s) Performed: Procedure(s) (LRB): CARPAL TUNNEL RELEASE (Right)  Patient location during evaluation: PACU Anesthesia Type: General Level of consciousness: awake and alert Pain management: pain level controlled Vital Signs Assessment: post-procedure vital signs reviewed and stable Respiratory status: spontaneous breathing, nonlabored ventilation, respiratory function stable and patient connected to nasal cannula oxygen Cardiovascular status: blood pressure returned to baseline and stable Postop Assessment: no signs of nausea or vomiting Anesthetic complications: no     Last Vitals:  Vitals:   09/15/16 0919 09/15/16 0953  BP: 117/64 140/68  Pulse: (!) 56 (!) 58  Resp: 16   Temp: (!) 35.9 C     Last Pain:  Vitals:   09/15/16 0919  TempSrc: Temporal  PainSc:                  Jesse Thompson

## 2016-09-15 NOTE — Progress Notes (Signed)
Right hand warm to to touch, patient able to move All fingers, color pink and wnl.  Patient denies any pain And states hand still feels numb.

## 2016-09-15 NOTE — Anesthesia Post-op Follow-up Note (Signed)
Anesthesia QCDR form completed.        

## 2016-09-15 NOTE — H&P (Signed)
THE PATIENT WAS SEEN PRIOR TO SURGERY TODAY.  HISTORY, ALLERGIES, HOME MEDICATIONS AND OPERATIVE PROCEDURE WERE REVIEWED. RISKS AND BENEFITS OF SURGERY DISCUSSED WITH PATIENT AGAIN.  NO CHANGES FROM INITIAL HISTORY AND PHYSICAL NOTED.    

## 2016-09-15 NOTE — Anesthesia Preprocedure Evaluation (Signed)
Anesthesia Evaluation  Patient identified by MRN, date of birth, ID band Patient awake    Reviewed: Allergy & Precautions, H&P , NPO status , Patient's Chart, lab work & pertinent test results  History of Anesthesia Complications Negative for: history of anesthetic complications  Airway Mallampati: III  TM Distance: <3 FB Neck ROM: limited    Dental  (+) Poor Dentition, Chipped, Missing   Pulmonary neg pulmonary ROS, neg shortness of breath,           Cardiovascular Exercise Tolerance: Good hypertension, (-) angina(-) Past MI and (-) DOE      Neuro/Psych negative neurological ROS  negative psych ROS   GI/Hepatic Neg liver ROS, GERD  Medicated and Controlled,  Endo/Other  diabetes, Type 2  Renal/GU      Musculoskeletal  (+) Arthritis ,   Abdominal   Peds  Hematology negative hematology ROS (+)   Anesthesia Other Findings Past Medical History: No date: Bursitis No date: Diabetes mellitus without complication (HCC) No date: GERD (gastroesophageal reflux disease) No date: Gout No date: HOH (hard of hearing)     Comment:  Bilateral Hearing Aids No date: Hyperlipidemia No date: Hypertension No date: Osteoarthritis No date: Osteoarthritis 03/2013: Sclerosing cholangitis No date: Vertigo  Past Surgical History: 2015: BILE DUCT STENT PLACEMENT     Comment:  now out No date: FRACTURE SURGERY; Right     Comment:  Ankle No date: TOTAL THYROIDECTOMY     Reproductive/Obstetrics negative OB ROS                             Anesthesia Physical Anesthesia Plan  ASA: III  Anesthesia Plan: General LMA   Post-op Pain Management:    Induction: Intravenous  PONV Risk Score and Plan: 2 and Ondansetron, Dexamethasone and Treatment may vary due to age or medical condition  Airway Management Planned: LMA  Additional Equipment:   Intra-op Plan:   Post-operative Plan: Extubation in  OR  Informed Consent: I have reviewed the patients History and Physical, chart, labs and discussed the procedure including the risks, benefits and alternatives for the proposed anesthesia with the patient or authorized representative who has indicated his/her understanding and acceptance.   Dental Advisory Given  Plan Discussed with: Anesthesiologist, CRNA and Surgeon  Anesthesia Plan Comments: (Patient consented for risks of anesthesia including but not limited to:  - adverse reactions to medications - damage to teeth, lips or other oral mucosa - sore throat or hoarseness - Damage to heart, brain, lungs or loss of life  Patient voiced understanding.)        Anesthesia Quick Evaluation

## 2016-09-15 NOTE — Progress Notes (Signed)
Ice pack to right arm.

## 2016-09-15 NOTE — Progress Notes (Signed)
Dr. Sabra Heck ok with ancef for patient, made aware Of pcn allergy.  Ok to continue with ancef.

## 2016-09-15 NOTE — Transfer of Care (Signed)
Immediate Anesthesia Transfer of Care Note  Patient: Jesse Thompson  Procedure(s) Performed: Procedure(s): CARPAL TUNNEL RELEASE (Right)  Patient Location: PACU  Anesthesia Type:General  Level of Consciousness: awake, oriented and patient cooperative  Airway & Oxygen Therapy: Patient Spontanous Breathing and Patient connected to face mask oxygen  Post-op Assessment: Report given to RN and Post -op Vital signs reviewed and stable  Post vital signs: Reviewed and stable  Last Vitals:  Vitals:   09/15/16 0604 09/15/16 0838  BP: (!) 162/103 (!) 148/86  Pulse: (!) 57 (!) 125  Resp: 16 13  Temp: (!) 36.2 C 36.4 C    Last Pain:  Vitals:   09/15/16 0604  TempSrc: Oral  PainSc: 4          Complications: No apparent anesthesia complications

## 2016-09-15 NOTE — Op Note (Signed)
09/15/2016  8:24 AM  PATIENT:  [facility] DIAGNOSIS: RIGHT CARPAL TUNNEL SYNDROME POST-OPERATIVE DIAGNOSIS: RIGHT CARPAL TUNNEL SYNDROME  PROCEDURE:  RIGHT CARPAL TUNNEL RELEASE  SURGEON: Park Breed, MD    ANESTHESIA:   General  TOURNIQUET TIME: 16   MIN  PREOPERATIVE INDICATIONS:  Jesse Thompson is a  66 y.o. male with a diagnosis of right carpal tunnel syndrome who failed conservative measures and elected for surgical management.    The risks benefits and alternatives were discussed with the patient preoperatively including but not limited to the risks of infection, bleeding, nerve injury, incomplete relief of symptoms, pillar pain, cardiopulmonary complications, the need for revision surgery, among others, and the patient was willing to proceed.  OPERATIVE FINDINGS: Thickened volar ligament and nerve compression.  OPERATIVE PROCEDURE: The patient is brought to the operating room placed in the supine position. General anesthesia was administered. The right upper extremity was prepped and draped in usual sterile fashion. Time out was performed. The arm was elevated and exsanguinated and the tourniquet was inflated. Incision was made in line with the radial border of the ring finger. The carpal tunnel transverse fascia was identified, cleaned, and incised sharply. The common sensory branches were visualized along with the superficial palmar arch and protected.  The median nerve was protected below  A Kelly clamp was  placed underneath the transverse carpal ligament, protecting the nerve. I released the ligament completely, and then released the proximal distal volar forearm fascia. The nerve was identified, and visualized, and protected throughout the case. The motor branch was intact upon inspection.  No masses or abnormalities were identified in ulnar bursa.  The wounds were irrigated copiously, and the wounds injected with 1/2% sensorcaine, and the skin closed  with nylon followed by a volar splint and sterile gauze.  Tourniquet was deflated with good return of blood flow to all fingers. Sponge and needle counts were correct.  The patient tolerated this well, with no complications. The patient was awakened and taken to recovery in good condition.

## 2016-09-16 MED ORDER — MIDAZOLAM HCL 2 MG/2ML IJ SOLN
INTRAMUSCULAR | Status: AC
  Filled 2016-09-16: qty 2

## 2016-09-16 MED ORDER — LIDOCAINE HCL (PF) 2 % IJ SOLN
INTRAMUSCULAR | Status: AC
  Filled 2016-09-16: qty 2

## 2016-09-16 MED ORDER — ACETAMINOPHEN 10 MG/ML IV SOLN
INTRAVENOUS | Status: AC
  Filled 2016-09-16: qty 100

## 2016-09-16 MED ORDER — ONDANSETRON HCL 4 MG/2ML IJ SOLN
INTRAMUSCULAR | Status: AC
Start: 2016-09-16 — End: ?
  Filled 2016-09-16: qty 2

## 2016-09-16 MED ORDER — GLYCOPYRROLATE 0.2 MG/ML IJ SOLN
INTRAMUSCULAR | Status: AC
  Filled 2016-09-16: qty 4

## 2016-09-16 MED ORDER — FENTANYL CITRATE (PF) 100 MCG/2ML IJ SOLN
INTRAMUSCULAR | Status: AC
  Filled 2016-09-16: qty 2

## 2016-09-16 MED ORDER — ROCURONIUM BROMIDE 50 MG/5ML IV SOLN
INTRAVENOUS | Status: AC
  Filled 2016-09-16: qty 1

## 2016-09-16 MED ORDER — DEXAMETHASONE SODIUM PHOSPHATE 10 MG/ML IJ SOLN
INTRAMUSCULAR | Status: AC
  Filled 2016-09-16: qty 1

## 2016-09-16 MED ORDER — NEOSTIGMINE METHYLSULFATE 10 MG/10ML IV SOLN
INTRAVENOUS | Status: AC
  Filled 2016-09-16: qty 1

## 2016-09-16 MED ORDER — PROPOFOL 10 MG/ML IV BOLUS
INTRAVENOUS | Status: AC
  Filled 2016-09-16: qty 40

## 2016-09-17 DIAGNOSIS — G5601 Carpal tunnel syndrome, right upper limb: Secondary | ICD-10-CM | POA: Diagnosis not present

## 2016-09-22 ENCOUNTER — Encounter
Admission: RE | Admit: 2016-09-22 | Discharge: 2016-09-22 | Disposition: A | Discharge status: Home / Self Care | Payer: Medicare Other | Source: Ambulatory Visit | Attending: Specialist | Admitting: Specialist

## 2016-09-22 NOTE — Patient Instructions (Signed)
  Your procedure is scheduled on: 09/29/16 Report to Day Surgery.[facility]OND FLOOR To find out your arrival time please call [phone] between 1PM - 3PM on 09/28/16.  Remember: Instructions that are not followed completely may result in serious medical risk, up to and including death, or upon the discretion of your surgeon and anesthesiologist your surgery may need to be rescheduled.    __X__ 1. Do not eat food or drink liquids after midnight. No gum chewing or hard candies.     __X__ 2. No Alcohol for 24 hours before or after surgery.   __X__ 3. Do Not Smoke For 24 Hours Prior to Your Surgery.   ____ 4. Bring all medications with you on the day of surgery if instructed.    __X__ 5. Notify your doctor if there is any change in your medical condition     (cold, fever, infections).       Do not wear jewelry, make-up, hairpins, clips or nail polish.  Do not wear lotions, powders, or perfumes. You may wear deodorant.  Do not shave 48 hours prior to surgery. Men may shave face and neck.  Do not bring valuables to the hospital.    [facility] is not responsible for any belongings or valuables.               Contacts, dentures or bridgework may not be worn into surgery.  Leave your suitcase in the car. After surgery it may be brought to your room.  For patients admitted to the hospital, discharge time is determined by your                treatment team.   Patients discharged the day of surgery will not be allowed to drive home.     _X___ Take these medicines the morning of surgery with A SIP OF WATER:    1. OMEPRAZOLE  2. HYDRALAZINE  3. LEVOTHYROXINE  4.  5.  6.  ____ Fleet Enema (as directed)   ____ Use CHG Soap as directed  ____ Use inhalers on the day of surgery  ____ Stop metformin 2 days prior to surgery    ____ Take 1/2 of usual insulin dose the night before surgery and none on the morning of surgery.   ____ Stop Coumadin/Plavix/aspirin on   ___X_ Stop  Anti-inflammatories on   [place]   ____ Stop supplements until after surgery.    ____ Bring C-Pap to the hospital.

## 2016-09-28 MED ORDER — CLINDAMYCIN PHOSPHATE 600 MG/50ML IV SOLN
600.0000 mg | INTRAVENOUS | Status: AC
  Administered 2016-09-29: 600 mg via INTRAVENOUS

## 2016-09-28 MED ORDER — CEFAZOLIN SODIUM-DEXTROSE 2-4 GM/100ML-% IV SOLN
2.0000 g | INTRAVENOUS | Status: AC
  Administered 2016-09-29: 2 g via INTRAVENOUS

## 2016-09-29 ENCOUNTER — Encounter: Payer: Self-pay | Admitting: Anesthesiology

## 2016-09-29 ENCOUNTER — Ambulatory Visit: Payer: Medicare HMO | Admitting: Anesthesiology

## 2016-09-29 ENCOUNTER — Encounter
Admission: RE | Disposition: A | Discharge status: Home / Self Care | Payer: Self-pay | Source: Ambulatory Visit | Attending: Specialist

## 2016-09-29 ENCOUNTER — Ambulatory Visit
Admission: RE | Admit: 2016-09-29 | Discharge: 2016-09-29 | Disposition: A | Discharge status: Home / Self Care | Payer: Medicare HMO | Source: Ambulatory Visit | Attending: Specialist | Admitting: Specialist

## 2016-09-29 DIAGNOSIS — K219 Gastro-esophageal reflux disease without esophagitis: Secondary | ICD-10-CM | POA: Diagnosis not present

## 2016-09-29 DIAGNOSIS — G5602 Carpal tunnel syndrome, left upper limb: Secondary | ICD-10-CM | POA: Insufficient documentation

## 2016-09-29 DIAGNOSIS — E119 Type 2 diabetes mellitus without complications: Secondary | ICD-10-CM | POA: Diagnosis not present

## 2016-09-29 DIAGNOSIS — Z880 Allergy status to penicillin: Secondary | ICD-10-CM | POA: Insufficient documentation

## 2016-09-29 DIAGNOSIS — I10 Essential (primary) hypertension: Secondary | ICD-10-CM | POA: Diagnosis not present

## 2016-09-29 DIAGNOSIS — M109 Gout, unspecified: Secondary | ICD-10-CM | POA: Diagnosis not present

## 2016-09-29 HISTORY — PX: CARPAL TUNNEL RELEASE: SHX101

## 2016-09-29 LAB — GLUCOSE, CAPILLARY
GLUCOSE-CAPILLARY: 144 mg/dL — AB (ref 65–99)
GLUCOSE-CAPILLARY: 137 mg/dL — AB (ref 65–99)

## 2016-09-29 SURGERY — CARPAL TUNNEL RELEASE
Anesthesia: General | Laterality: Left

## 2016-09-29 MED ORDER — LIDOCAINE HCL (PF) 2 % IJ SOLN
INTRAMUSCULAR | Status: AC
  Filled 2016-09-29: qty 2

## 2016-09-29 MED ORDER — MELOXICAM 7.5 MG PO TABS
15.0000 mg | ORAL_TABLET | Freq: Once | ORAL | Status: AC
  Administered 2016-09-29: 15 mg via ORAL

## 2016-09-29 MED ORDER — GLYCOPYRROLATE 0.2 MG/ML IJ SOLN
INTRAMUSCULAR | Status: DC | PRN
  Administered 2016-09-29: 0.2 mg via INTRAVENOUS

## 2016-09-29 MED ORDER — FENTANYL CITRATE (PF) 100 MCG/2ML IJ SOLN
INTRAMUSCULAR | Status: DC | PRN
  Administered 2016-09-29: 50 ug via INTRAVENOUS

## 2016-09-29 MED ORDER — CEFAZOLIN SODIUM-DEXTROSE 2-4 GM/100ML-% IV SOLN
INTRAVENOUS | Status: AC
  Filled 2016-09-29: qty 100

## 2016-09-29 MED ORDER — ONDANSETRON HCL 4 MG/2ML IJ SOLN
INTRAMUSCULAR | Status: AC
  Filled 2016-09-29: qty 2

## 2016-09-29 MED ORDER — PROPOFOL 10 MG/ML IV BOLUS
INTRAVENOUS | Status: AC
  Filled 2016-09-29: qty 20

## 2016-09-29 MED ORDER — LIDOCAINE HCL (CARDIAC) 20 MG/ML IV SOLN
INTRAVENOUS | Status: DC | PRN
  Administered 2016-09-29: 100 mg via INTRAVENOUS

## 2016-09-29 MED ORDER — GABAPENTIN 400 MG PO CAPS
400.0000 mg | ORAL_CAPSULE | Freq: Once | ORAL | Status: AC
  Administered 2016-09-29: 400 mg via ORAL

## 2016-09-29 MED ORDER — BUPIVACAINE HCL 0.5 % IJ SOLN
INTRAMUSCULAR | Status: DC | PRN
  Administered 2016-09-29: 15 mL

## 2016-09-29 MED ORDER — MELOXICAM 7.5 MG PO TABS
ORAL_TABLET | ORAL | Status: AC
  Filled 2016-09-29: qty 2

## 2016-09-29 MED ORDER — FENTANYL CITRATE (PF) 100 MCG/2ML IJ SOLN: 25.0000 ug | INTRAMUSCULAR | Status: DC | PRN

## 2016-09-29 MED ORDER — EPHEDRINE SULFATE 50 MG/ML IJ SOLN
INTRAMUSCULAR | Status: DC | PRN
  Administered 2016-09-29 (×2): 10 mg via INTRAVENOUS

## 2016-09-29 MED ORDER — BUPIVACAINE HCL (PF) 0.5 % IJ SOLN
INTRAMUSCULAR | Status: AC
  Filled 2016-09-29: qty 30

## 2016-09-29 MED ORDER — ONDANSETRON HCL 4 MG/2ML IJ SOLN
INTRAMUSCULAR | Status: DC | PRN
  Administered 2016-09-29: 4 mg via INTRAVENOUS

## 2016-09-29 MED ORDER — GABAPENTIN 400 MG PO CAPS
ORAL_CAPSULE | ORAL | Status: AC
  Filled 2016-09-29: qty 1

## 2016-09-29 MED ORDER — CLINDAMYCIN PHOSPHATE 600 MG/50ML IV SOLN
INTRAVENOUS | Status: AC
  Filled 2016-09-29: qty 50

## 2016-09-29 MED ORDER — SODIUM CHLORIDE 0.9 % IV SOLN
INTRAVENOUS | Status: DC
  Administered 2016-09-29 (×2): via INTRAVENOUS

## 2016-09-29 MED ORDER — CHLORHEXIDINE GLUCONATE CLOTH 2 % EX PADS
6.0000 | MEDICATED_PAD | Freq: Once | CUTANEOUS | Status: AC
  Administered 2016-09-29: 6 via TOPICAL

## 2016-09-29 MED ORDER — ONDANSETRON HCL 4 MG/2ML IJ SOLN: 4.0000 mg | Freq: Once | INTRAMUSCULAR | Status: DC | PRN

## 2016-09-29 MED ORDER — MIDAZOLAM HCL 2 MG/2ML IJ SOLN
INTRAMUSCULAR | Status: DC | PRN
  Administered 2016-09-29: 2 mg via INTRAVENOUS

## 2016-09-29 MED ORDER — EPHEDRINE SULFATE 50 MG/ML IJ SOLN
INTRAMUSCULAR | Status: AC
  Filled 2016-09-29: qty 1

## 2016-09-29 MED ORDER — GLYCOPYRROLATE 0.2 MG/ML IJ SOLN
INTRAMUSCULAR | Status: AC
  Filled 2016-09-29: qty 1

## 2016-09-29 MED ORDER — FENTANYL CITRATE (PF) 100 MCG/2ML IJ SOLN
INTRAMUSCULAR | Status: AC
  Filled 2016-09-29: qty 2

## 2016-09-29 MED ORDER — PROPOFOL 10 MG/ML IV BOLUS
INTRAVENOUS | Status: DC | PRN
  Administered 2016-09-29: 150 mg via INTRAVENOUS

## 2016-09-29 MED ORDER — MIDAZOLAM HCL 2 MG/2ML IJ SOLN
INTRAMUSCULAR | Status: AC
  Filled 2016-09-29: qty 2

## 2016-09-29 SURGICAL SUPPLY — 26 items
BLADE SURG MINI STRL (BLADE) ×3 IMPLANT
BNDG ESMARK 4X12 TAN STRL LF (GAUZE/BANDAGES/DRESSINGS) ×3 IMPLANT
CANISTER SUCT 1200ML W/VALVE (MISCELLANEOUS) ×3 IMPLANT
CHLORAPREP W/TINT 26ML (MISCELLANEOUS) ×3 IMPLANT
CUFF TOURN 18 STER (MISCELLANEOUS) IMPLANT
ELECT REM PT RETURN 9FT ADLT (ELECTROSURGICAL) ×3
ELECTRODE REM PT RTRN 9FT ADLT (ELECTROSURGICAL) ×1 IMPLANT
GAUZE FLUFF 18X24 1PLY STRL (GAUZE/BANDAGES/DRESSINGS) ×3 IMPLANT
GAUZE PETRO XEROFOAM 1X8 (MISCELLANEOUS) ×3 IMPLANT
GLOVE BIO SURGEON STRL SZ8 (GLOVE) ×3 IMPLANT
GOWN STRL REUS W/ TWL LRG LVL3 (GOWN DISPOSABLE) ×1 IMPLANT
GOWN STRL REUS W/TWL LRG LVL3 (GOWN DISPOSABLE) ×2
GOWN STRL REUS W/TWL LRG LVL4 (GOWN DISPOSABLE) ×3 IMPLANT
KIT RM TURNOVER STRD PROC AR (KITS) ×3 IMPLANT
NS IRRIG 500ML POUR BTL (IV SOLUTION) ×3 IMPLANT
PACK EXTREMITY ARMC (MISCELLANEOUS) ×3 IMPLANT
PAD PREP 24X41 OB/GYN DISP (PERSONAL CARE ITEMS) ×3 IMPLANT
PADDING CAST 4IN STRL (MISCELLANEOUS) ×2
PADDING CAST BLEND 4X4 STRL (MISCELLANEOUS) ×1 IMPLANT
SPLINT CAST 1 STEP 3X12 (MISCELLANEOUS) ×3 IMPLANT
STOCKINETTE BIAS CUT 4 980044 (GAUZE/BANDAGES/DRESSINGS) ×3 IMPLANT
STOCKINETTE STRL 4IN 9604848 (GAUZE/BANDAGES/DRESSINGS) ×3 IMPLANT
SUT ETHILON 4-0 (SUTURE) ×2
SUT ETHILON 4-0 FS2 18XMFL BLK (SUTURE) ×1
SUT ETHILON 5-0 FS-2 18 BLK (SUTURE) ×3 IMPLANT
SUTURE ETHLN 4-0 FS2 18XMF BLK (SUTURE) ×1 IMPLANT

## 2016-09-29 NOTE — Anesthesia Preprocedure Evaluation (Addendum)
Anesthesia Evaluation  Patient identified by MRN, date of birth, ID band Patient awake    Reviewed: Allergy & Precautions, NPO status , Patient's Chart, lab work & pertinent test results, reviewed documented beta blocker date and time   Airway Mallampati: III  TM Distance: >3 FB     Dental  (+) Chipped, Missing   Pulmonary           Cardiovascular hypertension, Pt. on medications      Neuro/Psych    GI/Hepatic GERD  Controlled,  Endo/Other  diabetes, Type 2  Renal/GU      Musculoskeletal   Abdominal   Peds  Hematology   Anesthesia Other Findings Gout.Decreased hearing. Hypertensive this am. Overbite.  Reproductive/Obstetrics                            Anesthesia Physical Anesthesia Plan  ASA: III  Anesthesia Plan: General   Post-op Pain Management:    Induction: Intravenous  PONV Risk Score and Plan:   Airway Management Planned: LMA  Additional Equipment:   Intra-op Plan:   Post-operative Plan:   Informed Consent: I have reviewed the patients History and Physical, chart, labs and discussed the procedure including the risks, benefits and alternatives for the proposed anesthesia with the patient or authorized representative who has indicated his/her understanding and acceptance.     Plan Discussed with: CRNA  Anesthesia Plan Comments:         Anesthesia Quick Evaluation

## 2016-09-29 NOTE — Anesthesia Postprocedure Evaluation (Signed)
Anesthesia Post Note  Patient: Jesse Thompson  Procedure(s) Performed: Procedure(s) (LRB): CARPAL TUNNEL RELEASE (Left)  Patient location during evaluation: PACU Anesthesia Type: General Level of consciousness: awake and alert Pain management: pain level controlled Vital Signs Assessment: post-procedure vital signs reviewed and stable Respiratory status: spontaneous breathing, nonlabored ventilation, respiratory function stable and patient connected to nasal cannula oxygen Cardiovascular status: blood pressure returned to baseline and stable Postop Assessment: no signs of nausea or vomiting Anesthetic complications: no     Last Vitals:  Vitals:   09/29/16 1208 09/29/16 1221  BP:  (!) 177/83  Pulse:  64  Resp:  16  Temp: (!) 36.2 C (!) 35.7 C  SpO2: 100% 100%    Last Pain:  Vitals:   09/29/16 1221  TempSrc: Temporal  PainSc:                  Jesse Thompson S

## 2016-09-29 NOTE — Progress Notes (Signed)
Capillary refill positive to left hand   Can wiggle fingers on left   Fingers warm to touch

## 2016-09-29 NOTE — Progress Notes (Signed)
Ice pack to left hand 

## 2016-09-29 NOTE — H&P (Signed)
THE PATIENT WAS SEEN PRIOR TO SURGERY TODAY.  HISTORY, ALLERGIES, HOME MEDICATIONS AND OPERATIVE PROCEDURE WERE REVIEWED. RISKS AND BENEFITS OF SURGERY DISCUSSED WITH PATIENT AGAIN.  NO CHANGES FROM INITIAL HISTORY AND PHYSICAL NOTED.    

## 2016-09-29 NOTE — Op Note (Signed)
09/29/2016  11:15 AM  PATIENT:  Jesse Thompson    PRE-OPERATIVE DIAGNOSIS: LEFT CARPAL TUNNEL SYNDROME  POST-OPERATIVE DIAGNOSIS: LEFT CARPAL TUNNEL SYNDROME  PROCEDURE:  LEFT CARPAL TUNNEL RELEASE  SURGEON: Park Breed, MD  TOURNIQUET TIME: 15  MIN   ANESTHESIA:   General  PREOPERATIVE INDICATIONS:  Jesse Thompson is a  66 y.o. male with a diagnosis of left carpal tunnel syndrome who failed conservative measures and elected for surgical management.    The risks benefits and alternatives were discussed with the patient preoperatively including but not limited to the risks of infection, bleeding, nerve injury, incomplete relief of symptoms, pillar pain, cardiopulmonary complications, the need for revision surgery, among others, and the patient was willing to proceed.  OPERATIVE FINDINGS: Thickened volar ligament and nerve compression.  OPERATIVE PROCEDURE: The patient is brought to the operating room placed in the supine position. General anesthesia was administered. The left upper extremity was prepped and draped in usual sterile fashion. Time out was performed. The arm was elevated and exsanguinated and the tourniquet was inflated. Incision was made in line with the radial border of the ring finger. The carpal tunnel transverse fascia was identified, cleaned, and incised sharply. The common sensory branches were visualized along with the superficial palmar arch and protected.  The median nerve was protected below. A Kelly clamp was  placed underneath the transverse carpal ligament, protecting the nerve. I released the ligament completely, and then released the proximal distal volar forearm fascia. The nerve was identified, and visualized, and protected throughout the case. The motor branch was intact upon inspection. No masses or abnormalities were identified in the ulnar bursa.  The wounds were irrigated copiously, and the wounds injected with 1/2 % marcaqine, and the skin closed  with nylon followed by a volar splint and sterile gauze. Tourniquet was deflated with good return of blood flow to all fingers. Sponge and needle counts were correct.  The patient tolerated this well, with no complications. The patient was awakened and taken to recovery in good condition.

## 2016-09-29 NOTE — Transfer of Care (Signed)
Immediate Anesthesia Transfer of Care Note  Patient: Jesse Thompson  Procedure(s) Performed: Procedure(s) with comments: CARPAL TUNNEL RELEASE (Left) - sutures removed right hand from status post carpal tunnel  Patient Location: PACU  Anesthesia Type:General  Level of Consciousness: drowsy and patient cooperative  Airway & Oxygen Therapy: Patient Spontanous Breathing and Patient connected to face mask oxygen  Post-op Assessment: Report given to RN and Post -op Vital signs reviewed and stable  Post vital signs: Reviewed and stable  Last Vitals:  Vitals:   09/29/16 0859  BP: (!) 176/89  Pulse: (!) 59  Resp: 18  Temp: 36.7 C  SpO2: 99%    Last Pain:  Vitals:   09/29/16 0859  TempSrc: Oral  PainSc: 4          Complications: No apparent anesthesia complications

## 2016-09-29 NOTE — Anesthesia Procedure Notes (Signed)
Procedure Name: LMA Insertion Date/Time: 09/29/2016 10:32 AM Performed by: Silvana Newness Pre-anesthesia Checklist: Patient identified, Emergency Drugs available, Suction available, Patient being monitored and Timeout performed Patient Re-evaluated:Patient Re-evaluated prior to induction Oxygen Delivery Method: Circle system utilized Preoxygenation: Pre-oxygenation with 100% oxygen Induction Type: IV induction Ventilation: Mask ventilation without difficulty LMA: LMA inserted LMA Size: 4.0 Number of attempts: 1 Placement Confirmation: positive ETCO2 and breath sounds checked- equal and bilateral Tube secured with: Tape Dental Injury: Teeth and Oropharynx as per pre-operative assessment

## 2016-09-29 NOTE — Anesthesia Post-op Follow-up Note (Signed)
Anesthesia QCDR form completed.        

## 2016-10-01 DIAGNOSIS — G5602 Carpal tunnel syndrome, left upper limb: Secondary | ICD-10-CM | POA: Diagnosis not present

## 2016-10-04 DIAGNOSIS — Z0100 Encounter for examination of eyes and vision without abnormal findings: Secondary | ICD-10-CM | POA: Diagnosis not present

## 2016-10-04 DIAGNOSIS — H25013 Cortical age-related cataract, bilateral: Secondary | ICD-10-CM | POA: Diagnosis not present

## 2016-10-04 DIAGNOSIS — H524 Presbyopia: Secondary | ICD-10-CM | POA: Diagnosis not present

## 2016-11-10 DIAGNOSIS — D2262 Melanocytic nevi of left upper limb, including shoulder: Secondary | ICD-10-CM | POA: Diagnosis not present

## 2016-11-10 DIAGNOSIS — B078 Other viral warts: Secondary | ICD-10-CM | POA: Diagnosis not present

## 2016-11-10 DIAGNOSIS — L570 Actinic keratosis: Secondary | ICD-10-CM | POA: Diagnosis not present

## 2016-11-10 DIAGNOSIS — D225 Melanocytic nevi of trunk: Secondary | ICD-10-CM | POA: Diagnosis not present

## 2016-11-10 DIAGNOSIS — L821 Other seborrheic keratosis: Secondary | ICD-10-CM | POA: Diagnosis not present

## 2016-11-10 DIAGNOSIS — Z23 Encounter for immunization: Secondary | ICD-10-CM | POA: Diagnosis not present

## 2016-11-10 DIAGNOSIS — E119 Type 2 diabetes mellitus without complications: Secondary | ICD-10-CM | POA: Diagnosis not present

## 2016-11-10 DIAGNOSIS — X32XXXA Exposure to sunlight, initial encounter: Secondary | ICD-10-CM | POA: Diagnosis not present

## 2016-11-10 DIAGNOSIS — R238 Other skin changes: Secondary | ICD-10-CM | POA: Diagnosis not present

## 2016-11-10 DIAGNOSIS — D2261 Melanocytic nevi of right upper limb, including shoulder: Secondary | ICD-10-CM | POA: Diagnosis not present

## 2016-11-10 DIAGNOSIS — I10 Essential (primary) hypertension: Secondary | ICD-10-CM | POA: Diagnosis not present

## 2016-11-16 DIAGNOSIS — M19042 Primary osteoarthritis, left hand: Secondary | ICD-10-CM | POA: Diagnosis not present

## 2016-11-16 DIAGNOSIS — M19041 Primary osteoarthritis, right hand: Secondary | ICD-10-CM | POA: Diagnosis not present

## 2016-11-16 DIAGNOSIS — I10 Essential (primary) hypertension: Secondary | ICD-10-CM | POA: Diagnosis not present

## 2017-01-03 DIAGNOSIS — E119 Type 2 diabetes mellitus without complications: Secondary | ICD-10-CM | POA: Diagnosis not present

## 2017-01-03 DIAGNOSIS — I10 Essential (primary) hypertension: Secondary | ICD-10-CM | POA: Diagnosis not present

## 2017-01-03 DIAGNOSIS — G4733 Obstructive sleep apnea (adult) (pediatric): Secondary | ICD-10-CM | POA: Diagnosis not present

## 2017-01-03 DIAGNOSIS — E7849 Other hyperlipidemia: Secondary | ICD-10-CM | POA: Diagnosis not present

## 2017-01-03 DIAGNOSIS — Z8639 Personal history of other endocrine, nutritional and metabolic disease: Secondary | ICD-10-CM | POA: Diagnosis not present

## 2017-01-03 DIAGNOSIS — K8301 Primary sclerosing cholangitis: Secondary | ICD-10-CM | POA: Diagnosis not present

## 2017-01-03 DIAGNOSIS — E890 Postprocedural hypothyroidism: Secondary | ICD-10-CM | POA: Diagnosis not present

## 2017-01-03 DIAGNOSIS — M109 Gout, unspecified: Secondary | ICD-10-CM | POA: Diagnosis not present

## 2017-01-03 DIAGNOSIS — Z125 Encounter for screening for malignant neoplasm of prostate: Secondary | ICD-10-CM | POA: Diagnosis not present

## 2017-01-10 DIAGNOSIS — R600 Localized edema: Secondary | ICD-10-CM | POA: Diagnosis not present

## 2017-01-17 ENCOUNTER — Encounter (INDEPENDENT_AMBULATORY_CARE_PROVIDER_SITE_OTHER): Payer: 59

## 2017-01-19 DIAGNOSIS — G4733 Obstructive sleep apnea (adult) (pediatric): Secondary | ICD-10-CM | POA: Diagnosis not present

## 2017-01-21 DIAGNOSIS — G4733 Obstructive sleep apnea (adult) (pediatric): Secondary | ICD-10-CM | POA: Diagnosis not present

## 2017-02-17 ENCOUNTER — Other Ambulatory Visit (INDEPENDENT_AMBULATORY_CARE_PROVIDER_SITE_OTHER): Payer: Medicare HMO

## 2017-02-17 ENCOUNTER — Other Ambulatory Visit (INDEPENDENT_AMBULATORY_CARE_PROVIDER_SITE_OTHER): Payer: Self-pay | Admitting: Internal Medicine

## 2017-02-17 DIAGNOSIS — I10 Essential (primary) hypertension: Secondary | ICD-10-CM

## 2017-06-29 DIAGNOSIS — S93492A Sprain of other ligament of left ankle, initial encounter: Secondary | ICD-10-CM | POA: Diagnosis not present

## 2017-07-08 DIAGNOSIS — S93492A Sprain of other ligament of left ankle, initial encounter: Secondary | ICD-10-CM | POA: Diagnosis not present

## 2017-07-22 DIAGNOSIS — S93492A Sprain of other ligament of left ankle, initial encounter: Secondary | ICD-10-CM | POA: Diagnosis not present

## 2017-10-26 DIAGNOSIS — E7849 Other hyperlipidemia: Secondary | ICD-10-CM | POA: Diagnosis not present

## 2017-10-26 DIAGNOSIS — I10 Essential (primary) hypertension: Secondary | ICD-10-CM | POA: Diagnosis not present

## 2017-10-26 DIAGNOSIS — Z0000 Encounter for general adult medical examination without abnormal findings: Secondary | ICD-10-CM | POA: Diagnosis not present

## 2017-10-26 DIAGNOSIS — Z79899 Other long term (current) drug therapy: Secondary | ICD-10-CM | POA: Diagnosis not present

## 2017-10-26 DIAGNOSIS — G4733 Obstructive sleep apnea (adult) (pediatric): Secondary | ICD-10-CM | POA: Diagnosis not present

## 2017-10-26 DIAGNOSIS — E119 Type 2 diabetes mellitus without complications: Secondary | ICD-10-CM | POA: Diagnosis not present

## 2017-10-26 DIAGNOSIS — M109 Gout, unspecified: Secondary | ICD-10-CM | POA: Diagnosis not present

## 2017-10-26 DIAGNOSIS — Z125 Encounter for screening for malignant neoplasm of prostate: Secondary | ICD-10-CM | POA: Diagnosis not present

## 2017-10-26 DIAGNOSIS — E890 Postprocedural hypothyroidism: Secondary | ICD-10-CM | POA: Diagnosis not present

## 2017-11-04 ENCOUNTER — Inpatient Hospital Stay
Admission: EM | Admit: 2017-11-04 | Discharge: 2017-11-06 | DRG: 872 | Disposition: A | Discharge status: Home / Self Care | Payer: Medicare HMO | Attending: Internal Medicine | Admitting: Internal Medicine

## 2017-11-04 ENCOUNTER — Other Ambulatory Visit: Payer: Self-pay

## 2017-11-04 ENCOUNTER — Encounter: Payer: Self-pay | Admitting: Emergency Medicine

## 2017-11-04 ENCOUNTER — Emergency Department: Payer: Medicare HMO

## 2017-11-04 DIAGNOSIS — Z974 Presence of external hearing-aid: Secondary | ICD-10-CM | POA: Diagnosis not present

## 2017-11-04 DIAGNOSIS — N179 Acute kidney failure, unspecified: Secondary | ICD-10-CM | POA: Diagnosis present

## 2017-11-04 DIAGNOSIS — Z79890 Hormone replacement therapy (postmenopausal): Secondary | ICD-10-CM | POA: Diagnosis not present

## 2017-11-04 DIAGNOSIS — X500XXA Overexertion from strenuous movement or load, initial encounter: Secondary | ICD-10-CM | POA: Diagnosis not present

## 2017-11-04 DIAGNOSIS — E1122 Type 2 diabetes mellitus with diabetic chronic kidney disease: Secondary | ICD-10-CM | POA: Diagnosis not present

## 2017-11-04 DIAGNOSIS — Z7984 Long term (current) use of oral hypoglycemic drugs: Secondary | ICD-10-CM

## 2017-11-04 DIAGNOSIS — I129 Hypertensive chronic kidney disease with stage 1 through stage 4 chronic kidney disease, or unspecified chronic kidney disease: Secondary | ICD-10-CM | POA: Diagnosis present

## 2017-11-04 DIAGNOSIS — Z881 Allergy status to other antibiotic agents status: Secondary | ICD-10-CM

## 2017-11-04 DIAGNOSIS — Z8249 Family history of ischemic heart disease and other diseases of the circulatory system: Secondary | ICD-10-CM | POA: Diagnosis not present

## 2017-11-04 DIAGNOSIS — Z791 Long term (current) use of non-steroidal anti-inflammatories (NSAID): Secondary | ICD-10-CM | POA: Diagnosis not present

## 2017-11-04 DIAGNOSIS — R1011 Right upper quadrant pain: Secondary | ICD-10-CM | POA: Diagnosis present

## 2017-11-04 DIAGNOSIS — H9193 Unspecified hearing loss, bilateral: Secondary | ICD-10-CM | POA: Diagnosis present

## 2017-11-04 DIAGNOSIS — Z880 Allergy status to penicillin: Secondary | ICD-10-CM | POA: Diagnosis not present

## 2017-11-04 DIAGNOSIS — Z8719 Personal history of other diseases of the digestive system: Secondary | ICD-10-CM | POA: Diagnosis not present

## 2017-11-04 DIAGNOSIS — Z79899 Other long term (current) drug therapy: Secondary | ICD-10-CM | POA: Diagnosis not present

## 2017-11-04 DIAGNOSIS — R1084 Generalized abdominal pain: Secondary | ICD-10-CM | POA: Diagnosis not present

## 2017-11-04 DIAGNOSIS — K219 Gastro-esophageal reflux disease without esophagitis: Secondary | ICD-10-CM | POA: Diagnosis present

## 2017-11-04 DIAGNOSIS — N183 Chronic kidney disease, stage 3 (moderate): Secondary | ICD-10-CM | POA: Diagnosis present

## 2017-11-04 DIAGNOSIS — K8301 Primary sclerosing cholangitis: Secondary | ICD-10-CM | POA: Diagnosis present

## 2017-11-04 DIAGNOSIS — A419 Sepsis, unspecified organism: Secondary | ICD-10-CM | POA: Diagnosis not present

## 2017-11-04 DIAGNOSIS — M1990 Unspecified osteoarthritis, unspecified site: Secondary | ICD-10-CM | POA: Diagnosis present

## 2017-11-04 DIAGNOSIS — R748 Abnormal levels of other serum enzymes: Secondary | ICD-10-CM | POA: Diagnosis not present

## 2017-11-04 DIAGNOSIS — D696 Thrombocytopenia, unspecified: Secondary | ICD-10-CM | POA: Diagnosis not present

## 2017-11-04 DIAGNOSIS — R109 Unspecified abdominal pain: Secondary | ICD-10-CM | POA: Diagnosis not present

## 2017-11-04 DIAGNOSIS — E890 Postprocedural hypothyroidism: Secondary | ICD-10-CM | POA: Diagnosis present

## 2017-11-04 DIAGNOSIS — R509 Fever, unspecified: Secondary | ICD-10-CM | POA: Diagnosis not present

## 2017-11-04 DIAGNOSIS — E785 Hyperlipidemia, unspecified: Secondary | ICD-10-CM | POA: Diagnosis present

## 2017-11-04 DIAGNOSIS — D6959 Other secondary thrombocytopenia: Secondary | ICD-10-CM | POA: Diagnosis not present

## 2017-11-04 DIAGNOSIS — R1031 Right lower quadrant pain: Secondary | ICD-10-CM | POA: Diagnosis not present

## 2017-11-04 DIAGNOSIS — E039 Hypothyroidism, unspecified: Secondary | ICD-10-CM | POA: Diagnosis not present

## 2017-11-04 DIAGNOSIS — M109 Gout, unspecified: Secondary | ICD-10-CM | POA: Diagnosis present

## 2017-11-04 HISTORY — DX: Systemic involvement of connective tissue, unspecified: M35.9

## 2017-11-04 LAB — COMPREHENSIVE METABOLIC PANEL
ALBUMIN: 3.9 g/dL (ref 3.5–5.0)
ALT: 45 U/L — AB (ref 0–44)
ANION GAP: 12 (ref 5–15)
AST: 64 U/L — ABNORMAL HIGH (ref 15–41)
Alkaline Phosphatase: 89 U/L (ref 38–126)
BUN: 21 mg/dL (ref 8–23)
CHLORIDE: 108 mmol/L (ref 98–111)
CO2: 20 mmol/L — ABNORMAL LOW (ref 22–32)
CREATININE: 1.48 mg/dL — AB (ref 0.61–1.24)
Calcium: 8.7 mg/dL — ABNORMAL LOW (ref 8.9–10.3)
GFR calc non Af Amer: 47 mL/min — ABNORMAL LOW (ref >60)
GFR, EST AFRICAN AMERICAN: 54 mL/min — AB (ref >60)
GLUCOSE: 121 mg/dL — AB (ref 70–99)
Potassium: 3.4 mmol/L — ABNORMAL LOW (ref 3.5–5.1)
Sodium: 140 mmol/L (ref 135–145)
Total Bilirubin: 0.9 mg/dL (ref 0.3–1.2)
Total Protein: 6.5 g/dL (ref 6.5–8.1)

## 2017-11-04 LAB — LACTIC ACID, PLASMA
LACTIC ACID, VENOUS: 6 mmol/L — AB (ref 0.5–1.9)
LACTIC ACID, VENOUS: 5.9 mmol/L — AB (ref 0.5–1.9)
LACTIC ACID, VENOUS: 3.9 mmol/L — AB (ref 0.5–1.9)
LACTIC ACID, VENOUS: 2.5 mmol/L — AB (ref 0.5–1.9)
Lactic Acid, Venous: 4.7 mmol/L (ref 0.5–1.9)

## 2017-11-04 LAB — DIFFERENTIAL
BASOS PCT: 0 %
Basophils Absolute: 0 10*3/uL (ref 0–0.1)
Eosinophils Absolute: 0 10*3/uL (ref 0–0.7)
Eosinophils Relative: 0 %
LYMPHS ABS: 0.3 10*3/uL — AB (ref 1.0–3.6)
LYMPHS PCT: 8 %
Monocytes Absolute: 0 10*3/uL — ABNORMAL LOW (ref 0.2–1.0)
Monocytes Relative: 1 %
Neutro Abs: 3.8 10*3/uL (ref 1.4–6.5)
Neutrophils Relative %: 91 %

## 2017-11-04 LAB — URINALYSIS, COMPLETE (UACMP) WITH MICROSCOPIC
BACTERIA UA: NONE SEEN
Bilirubin Urine: NEGATIVE
Glucose, UA: NEGATIVE mg/dL
Hgb urine dipstick: NEGATIVE
Ketones, ur: NEGATIVE mg/dL
Leukocytes, UA: NEGATIVE
Nitrite: NEGATIVE
PROTEIN: NEGATIVE mg/dL
SPECIFIC GRAVITY, URINE: 1.043 — AB (ref 1.005–1.030)
SQUAMOUS EPITHELIAL / LPF: NONE SEEN (ref 0–5)
WBC UA: NONE SEEN WBC/hpf (ref 0–5)
pH: 5 (ref 5.0–8.0)

## 2017-11-04 LAB — CBC
HCT: 37.2 % — ABNORMAL LOW (ref 40.0–52.0)
HEMOGLOBIN: 12.5 g/dL — AB (ref 13.0–18.0)
MCH: 27.6 pg (ref 26.0–34.0)
MCHC: 33.6 g/dL (ref 32.0–36.0)
MCV: 82.2 fL (ref 80.0–100.0)
Platelets: 105 10*3/uL — ABNORMAL LOW (ref 150–440)
RBC: 4.53 MIL/uL (ref 4.40–5.90)
RDW: 16 % — ABNORMAL HIGH (ref 11.5–14.5)
WBC: 4.2 10*3/uL (ref 3.8–10.6)

## 2017-11-04 LAB — LIPASE, BLOOD: LIPASE: 25 U/L (ref 11–51)

## 2017-11-04 MED ORDER — GLIPIZIDE ER 2.5 MG PO TB24
2.5000 mg | ORAL_TABLET | Freq: Every day | ORAL | Status: DC
  Administered 2017-11-04 – 2017-11-05 (×2): 2.5 mg via ORAL
  Filled 2017-11-04 (×3): qty 1

## 2017-11-04 MED ORDER — VITAMIN B-12 1000 MCG PO TABS
1000.0000 ug | ORAL_TABLET | Freq: Every day | ORAL | Status: DC
  Administered 2017-11-04 – 2017-11-05 (×2): 1000 ug via ORAL
  Filled 2017-11-04 (×2): qty 1

## 2017-11-04 MED ORDER — IOPAMIDOL (ISOVUE-300) INJECTION 61%
30.0000 mL | Freq: Once | INTRAVENOUS | Status: AC | PRN
  Administered 2017-11-04: 30 mL via ORAL

## 2017-11-04 MED ORDER — SODIUM CHLORIDE 0.9 % IV BOLUS (SEPSIS)
1000.0000 mL | Freq: Once | INTRAVENOUS | Status: AC
  Administered 2017-11-04: 1000 mL via INTRAVENOUS

## 2017-11-04 MED ORDER — FENTANYL CITRATE (PF) 100 MCG/2ML IJ SOLN
50.0000 ug | Freq: Once | INTRAMUSCULAR | Status: AC
  Administered 2017-11-04: 50 ug via INTRAVENOUS
  Filled 2017-11-04: qty 2

## 2017-11-04 MED ORDER — OMEGA-3-ACID ETHYL ESTERS 1 G PO CAPS
1.0000 g | ORAL_CAPSULE | Freq: Every day | ORAL | Status: DC
  Administered 2017-11-04 – 2017-11-05 (×2): 1 g via ORAL
  Filled 2017-11-04 (×2): qty 1

## 2017-11-04 MED ORDER — ACETAMINOPHEN 325 MG PO TABS
650.0000 mg | ORAL_TABLET | Freq: Four times a day (QID) | ORAL | Status: DC | PRN
  Administered 2017-11-04: 650 mg via ORAL
  Filled 2017-11-04: qty 2

## 2017-11-04 MED ORDER — VANCOMYCIN HCL IN DEXTROSE 1-5 GM/200ML-% IV SOLN
1000.0000 mg | Freq: Once | INTRAVENOUS | Status: AC
  Administered 2017-11-04: 1000 mg via INTRAVENOUS
  Filled 2017-11-04: qty 200

## 2017-11-04 MED ORDER — ONDANSETRON HCL 4 MG/2ML IJ SOLN
4.0000 mg | Freq: Once | INTRAMUSCULAR | Status: AC | PRN
  Administered 2017-11-04: 4 mg via INTRAVENOUS
  Filled 2017-11-04: qty 2

## 2017-11-04 MED ORDER — INFLUENZA VAC SPLIT HIGH-DOSE 0.5 ML IM SUSY
0.5000 mL | PREFILLED_SYRINGE | INTRAMUSCULAR | Status: DC
  Filled 2017-11-04: qty 0.5

## 2017-11-04 MED ORDER — ACETAMINOPHEN 500 MG PO TABS
1000.0000 mg | ORAL_TABLET | Freq: Once | ORAL | Status: AC
  Administered 2017-11-04: 1000 mg via ORAL
  Filled 2017-11-04: qty 2

## 2017-11-04 MED ORDER — ALPRAZOLAM 0.25 MG PO TABS
0.2500 mg | ORAL_TABLET | Freq: Three times a day (TID) | ORAL | Status: DC | PRN
  Administered 2017-11-04 – 2017-11-06 (×2): 0.25 mg via ORAL
  Filled 2017-11-04 (×2): qty 1

## 2017-11-04 MED ORDER — GABAPENTIN 400 MG PO CAPS
400.0000 mg | ORAL_CAPSULE | Freq: Two times a day (BID) | ORAL | Status: DC
  Administered 2017-11-04 – 2017-11-05 (×4): 400 mg via ORAL
  Filled 2017-11-04 (×2): qty 1
  Filled 2017-11-04 (×2): qty 4
  Filled 2017-11-04 (×3): qty 1
  Filled 2017-11-04 (×2): qty 4
  Filled 2017-11-04: qty 1

## 2017-11-04 MED ORDER — AMLODIPINE BESYLATE 10 MG PO TABS
10.0000 mg | ORAL_TABLET | Freq: Every day | ORAL | Status: DC
  Administered 2017-11-04 – 2017-11-05 (×2): 10 mg via ORAL
  Filled 2017-11-04 (×2): qty 1

## 2017-11-04 MED ORDER — SODIUM CHLORIDE 0.9 % IV SOLN
2.0000 g | Freq: Once | INTRAVENOUS | Status: AC
  Administered 2017-11-04: 2 g via INTRAVENOUS
  Filled 2017-11-04: qty 2

## 2017-11-04 MED ORDER — ONDANSETRON HCL 4 MG/2ML IJ SOLN: 4.0000 mg | Freq: Four times a day (QID) | INTRAMUSCULAR | Status: DC | PRN

## 2017-11-04 MED ORDER — PIPERACILLIN-TAZOBACTAM 3.375 G IVPB
3.3750 g | Freq: Three times a day (TID) | INTRAVENOUS | Status: DC
  Administered 2017-11-04 – 2017-11-06 (×5): 3.375 g via INTRAVENOUS
  Filled 2017-11-04 (×7): qty 50

## 2017-11-04 MED ORDER — VANCOMYCIN HCL IN DEXTROSE 750-5 MG/150ML-% IV SOLN
750.0000 mg | Freq: Two times a day (BID) | INTRAVENOUS | Status: DC
  Administered 2017-11-04 (×2): 750 mg via INTRAVENOUS
  Filled 2017-11-04 (×3): qty 150

## 2017-11-04 MED ORDER — ENOXAPARIN SODIUM 40 MG/0.4ML ~~LOC~~ SOLN
40.0000 mg | SUBCUTANEOUS | Status: DC
  Administered 2017-11-04 – 2017-11-05 (×2): 40 mg via SUBCUTANEOUS
  Filled 2017-11-04 (×2): qty 0.4

## 2017-11-04 MED ORDER — IOPAMIDOL (ISOVUE-300) INJECTION 61%
100.0000 mL | Freq: Once | INTRAVENOUS | Status: AC | PRN
  Administered 2017-11-04: 100 mL via INTRAVENOUS

## 2017-11-04 MED ORDER — BISOPROLOL FUMARATE 5 MG PO TABS
2.5000 mg | ORAL_TABLET | Freq: Every day | ORAL | Status: DC
  Administered 2017-11-04 – 2017-11-05 (×2): 2.5 mg via ORAL
  Filled 2017-11-04 (×3): qty 0.5

## 2017-11-04 MED ORDER — ALLOPURINOL 100 MG PO TABS
100.0000 mg | ORAL_TABLET | Freq: Every day | ORAL | Status: DC
  Administered 2017-11-04 – 2017-11-05 (×2): 100 mg via ORAL
  Filled 2017-11-04 (×3): qty 1

## 2017-11-04 MED ORDER — IBUPROFEN 400 MG PO TABS
400.0000 mg | ORAL_TABLET | Freq: Four times a day (QID) | ORAL | Status: DC | PRN
  Administered 2017-11-04 – 2017-11-06 (×3): 400 mg via ORAL
  Filled 2017-11-04 (×3): qty 1

## 2017-11-04 MED ORDER — SODIUM CHLORIDE 0.9 % IV BOLUS
1000.0000 mL | Freq: Once | INTRAVENOUS | Status: AC
  Administered 2017-11-04: 1000 mL via INTRAVENOUS

## 2017-11-04 MED ORDER — PANTOPRAZOLE SODIUM 40 MG PO TBEC
40.0000 mg | DELAYED_RELEASE_TABLET | Freq: Every day | ORAL | Status: DC
  Administered 2017-11-04 – 2017-11-05 (×2): 40 mg via ORAL
  Filled 2017-11-04 (×2): qty 1

## 2017-11-04 MED ORDER — ACETAMINOPHEN 650 MG RE SUPP: 650.0000 mg | Freq: Four times a day (QID) | RECTAL | Status: DC | PRN

## 2017-11-04 MED ORDER — ONDANSETRON HCL 4 MG PO TABS: 4.0000 mg | ORAL_TABLET | Freq: Four times a day (QID) | ORAL | Status: DC | PRN

## 2017-11-04 MED ORDER — SENNOSIDES-DOCUSATE SODIUM 8.6-50 MG PO TABS: 1.0000 | ORAL_TABLET | Freq: Every evening | ORAL | Status: DC | PRN

## 2017-11-04 MED ORDER — SODIUM CHLORIDE 0.9 % IV SOLN
INTRAVENOUS | Status: DC
  Administered 2017-11-04 – 2017-11-05 (×4): via INTRAVENOUS

## 2017-11-04 MED ORDER — LEVOTHYROXINE SODIUM 100 MCG PO TABS
100.0000 ug | ORAL_TABLET | Freq: Every day | ORAL | Status: DC
  Administered 2017-11-05: 100 ug via ORAL
  Filled 2017-11-04: qty 1

## 2017-11-04 MED ORDER — SODIUM CHLORIDE 0.9 % IV SOLN
1.0000 g | Freq: Two times a day (BID) | INTRAVENOUS | Status: DC
  Filled 2017-11-04: qty 1

## 2017-11-04 MED ORDER — ONDANSETRON HCL 4 MG/2ML IJ SOLN
4.0000 mg | Freq: Once | INTRAMUSCULAR | Status: AC
  Administered 2017-11-04: 4 mg via INTRAVENOUS
  Filled 2017-11-04: qty 2

## 2017-11-04 MED ORDER — POTASSIUM CHLORIDE CRYS ER 20 MEQ PO TBCR
40.0000 meq | EXTENDED_RELEASE_TABLET | Freq: Once | ORAL | Status: AC
  Administered 2017-11-04: 40 meq via ORAL
  Filled 2017-11-04: qty 2

## 2017-11-04 NOTE — ED Provider Notes (Signed)
[facility] Emergency Department Provider Note   ____________________________________________   First MD Initiated Contact with Patient 11/04/17 [phone]     (approximate)  I have reviewed the triage vital signs and the nursing notes.   HISTORY  Chief Complaint Fever and Abdominal Pain    HPI Jesse Thompson is a 67 y.o. male who presents to the ED from home with a chief complaint of fever and abdominal pain.  Patient reports symptoms since yesterday afternoon.  Took Tylenol at 6 PM and again at 10 PM for feeling hot; wife says thermometer was not working.  Also complains of right lower quadrant abdominal pain.  Denies associated chest pain, cough, shortness of breath, nausea, vomiting, dysuria, diarrhea.  Denies recent travel or trauma.  History of sepsis for similar symptoms approximately 4 years ago.  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  At one point he was listed on the transplant list.  He has since been dismissed from his hepatologist due to stability.   Past Medical History:  Diagnosis Date  . Bursitis   . Collagen vascular disease ([place])   . Diabetes mellitus without complication ([place])   . GERD (gastroesophageal reflux disease)   . Gout   . HOH (hard of hearing)    Bilateral Hearing Aids  . Hyperlipidemia   . Hypertension   . Osteoarthritis   . Osteoarthritis   . 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 Vertigo     Patient Active Problem List   Diagnosis Date Noted  . Osteoarthritis     Past Surgical History:  Procedure Laterality Date  . BILE DUCT STENT PLACEMENT  2015   now out  . CARPAL TUNNEL RELEASE Right 09/15/2016   Procedure: CARPAL TUNNEL RELEASE;  Surgeon: Earnestine Leys, MD;  Location: [facility] ORS;  Service: Orthopedics;  Laterality: Right;  . CARPAL TUNNEL RELEASE Left 09/29/2016   Procedure: CARPAL TUNNEL RELEASE;  Surgeon: Earnestine Leys, MD;  Location: [facility] ORS;  Service: Orthopedics;  Laterality: Left;  sutures removed right hand from status post carpal tunnel  . FRACTURE SURGERY Right    Ankle  . TOTAL THYROIDECTOMY      Prior to Admission medications   Medication Sig Start Date End Date Taking? Authorizing Provider  allopurinol (ZYLOPRIM) 100 MG tablet Take 100 mg by mouth daily. 07/16/16   [provider]  famotidine (PEPCID) 20 MG tablet Take 20 mg by mouth every evening.  05/06/14   [provider]  gabapentin (NEURONTIN) 400 MG capsule Take 1 capsule (400 mg total) by mouth 2 (two) times daily. 09/15/16   Earnestine Leys, MD  glipiZIDE (GLUCOTROL XL) 2.5 MG 24 hr tablet Take 2.5 mg by mouth daily. 07/29/16   [provider]  hydrALAZINE (APRESOLINE) 25 MG tablet Take 25 mg by mouth 2 (two) times daily. 07/16/16   [provider]  HYDROcodone-acetaminophen (NORCO) 5-325 MG tablet Take 1-2 tablets by mouth every 6 (six) hours as needed. Patient taking differently: Take 1-2 tablets by mouth every 6 (six) hours as needed (for pain.).  09/15/16   Earnestine Leys, MD  levothyroxine (SYNTHROID, LEVOTHROID) 100 MCG tablet Take 100 mcg by mouth daily before breakfast. 07/16/16   [provider]  meloxicam (MOBIC) 15 MG tablet Take 1 tablet (15 mg total) by mouth daily. Patient taking differently: Take 15 mg by mouth every evening.  09/15/16   Earnestine Leys, MD  metFORMIN (  GLUCOPHAGE) 1000 MG tablet Take 1,000 mg by mouth 2 (two) times daily. 07/16/16   [provider]  Omega-3 Fatty Acids (FISH OIL) 1000 MG CAPS Take 1,000 mg by mouth daily.    [provider]  omeprazole (PRILOSEC) 40 MG capsule Take 40 mg by mouth daily before breakfast.  06/05/14   [provider]  sildenafil (VIAGRA) 100 MG tablet Take 100 mg by mouth daily as needed for erectile dysfunction.     [provider]  valsartan-hydrochlorothiazide (DIOVAN-HCT) 320-25 MG tablet Take 1 tablet by  mouth daily. 07/16/16   [provider]  vitamin B-12 (CYANOCOBALAMIN) 1000 MCG tablet Take 1,000 mcg by mouth daily.    [provider]    Allergies Metronidazole and Penicillin g  Family History  Problem Relation Age of Onset  . Hypertension Mother   . Hypertension Father     Social History Social History   Tobacco Use  . Smoking status: Never Smoker  . Smokeless tobacco: Never Used  Substance Use Topics  . Alcohol use: No    Alcohol/week: 0.0 standard drinks  . Drug use: No    Review of Systems  Constitutional: Positive for fever. Eyes: No visual changes. ENT: No sore throat. Cardiovascular: Denies chest pain. Respiratory: Denies shortness of breath. Gastrointestinal: Positive for abdominal pain.  No nausea, no vomiting.  No diarrhea.  No constipation. Genitourinary: Negative for dysuria. Musculoskeletal: Negative for back pain. Skin: Negative for rash. Neurological: Negative for headaches, focal weakness or numbness.   ____________________________________________   PHYSICAL EXAM:  VITAL SIGNS: ED Triage Vitals  Enc Vitals Group     BP 11/04/17 0237 (!) 119/57     Pulse Rate 11/04/17 0237 76     Resp 11/04/17 0237 20     Temp 11/04/17 0237 99.6 F (37.6 C)     Temp Source 11/04/17 0237 Oral     SpO2 11/04/17 0237 94 %     Weight 11/04/17 0240 190 lb (86.2 kg)     Height 11/04/17 0240 5\' 6"  (1.676 m)     Head Circumference --      Peak Flow --      Pain Score 11/04/17 0240 9     Pain Loc --      Pain Edu? --      Excl. in [place]? --     Constitutional: Alert and oriented. Well appearing and in mild acute distress. Eyes: Conjunctivae are normal. PERRL. EOMI. Head: Atraumatic. Nose: No congestion/rhinnorhea. Mouth/Throat: Mucous membranes are moist.  Oropharynx non-erythematous. Neck: No stridor.  Supple neck without meningismus. Cardiovascular: Normal rate, regular rhythm. Grossly normal heart sounds.  Good peripheral  circulation. Respiratory: Normal respiratory effort.  No retractions. Lungs CTAB. Gastrointestinal: Soft and mildly tender to palpation right lower quadrant without rebound or guarding. No distention. No abdominal bruits. No CVA tenderness. Musculoskeletal: No lower extremity tenderness nor edema.  No joint effusions. Neurologic:  Normal speech and language. No gross focal neurologic deficits are appreciated. No gait instability. Skin:  Skin is warm, dry and intact. No rash noted. Psychiatric: Mood and affect are normal. Speech and behavior are normal.  ____________________________________________   LABS (all labs ordered are listed, but only abnormal results are displayed)  Labs Reviewed  COMPREHENSIVE METABOLIC PANEL - Abnormal; Notable for the following components:      Result Value   Potassium 3.4 (*)    CO2 20 (*)    Glucose, Bld 121 (*)    Creatinine, Ser 1.48 (*)  Calcium 8.7 (*)    AST 64 (*)    ALT 45 (*)    GFR calc non Af Amer 47 (*)    GFR calc Af Amer 54 (*)    All other components within normal limits  CBC - Abnormal; Notable for the following components:   Hemoglobin 12.5 (*)    HCT 37.2 (*)    RDW 16.0 (*)    Platelets 105 (*)    All other components within normal limits  LACTIC ACID, PLASMA - Abnormal; Notable for the following components:   Lactic Acid, Venous 6.0 (*)    All other components within normal limits  LACTIC ACID, PLASMA - Abnormal; Notable for the following components:   Lactic Acid, Venous 5.9 (*)    All other components within normal limits  DIFFERENTIAL - Abnormal; Notable for the following components:   Lymphs Abs 0.3 (*)    Monocytes Absolute 0.0 (*)    All other components within normal limits  CULTURE, BLOOD (ROUTINE X 2)  CULTURE, BLOOD (ROUTINE X 2)  LIPASE, BLOOD  URINALYSIS, COMPLETE (UACMP) WITH MICROSCOPIC   ____________________________________________  EKG  ED ECG REPORT I, Simona Rocque J, the attending physician,  personally viewed and interpreted this ECG.   Date: 11/04/2017  EKG Time: 0251  Rate: 56  Rhythm: sinus bradycardia  Axis: Normal  Intervals:none  ST&T Change: Nonspecific  ____________________________________________  RADIOLOGY  ED MD interpretation: CT demonstrates no acute changes; chest x-ray demonstrates no acute cardiopulmonary process  Official radiology report(s): Ct Abdomen Pelvis W Contrast  Result Date: 11/04/2017 CLINICAL DATA:  Fever and right-sided abdominal pain EXAM: CT ABDOMEN AND PELVIS WITH CONTRAST TECHNIQUE: Multidetector CT imaging of the abdomen and pelvis was performed using the standard protocol following bolus administration of intravenous contrast. CONTRAST:  19mL ISOVUE-300 IOPAMIDOL (ISOVUE-300) INJECTION 61% COMPARISON:  MRI from 09/22/2015 and prior CT from 10/24/2012 FINDINGS: Lower chest: Lung bases are free of acute infiltrate or sizable effusion. Hepatobiliary: The liver again demonstrates some atrophic changes in the right lobe with a somewhat mild prominence of the biliary tree in the posterior aspect of the right lobe of the liver similar to that seen on prior MRI examination and again consistent with the patient's given clinical history of sclerosing cholangitis. No abnormal enhancement in the liver is identified. The gallbladder is decompressed. The previously seen biliary stent is no longer present. Pancreas: Unremarkable. No pancreatic ductal dilatation or surrounding inflammatory changes. Spleen: Normal in size without focal abnormality. Adrenals/Urinary Tract: Adrenal glands are within normal limits. Kidneys are well visualized bilaterally. No renal calculi or obstructive changes are seen. Dominant right upper pole cyst is noted stable from the prior MRI examination. No ureteral calculi are seen. The bladder is decompressed. Stomach/Bowel: The appendix is well visualized and within normal limits. Colon demonstrates scattered fecal material without  obstructive change. No small bowel obstructive change is seen. Vascular/Lymphatic: Atherosclerotic calcifications are noted without aneurysmal dilatation. Few scattered retroperitoneal lymph nodes are noted not significant by size criteria and relatively stable from previous exams. A 16 mm node is noted between the IVC and aorta on image number 31 of series 2 increased in size when compared with the prior MRI examination at which time it measured just under 9 mm. Stable gastrohepatic lymph node is noted as well. Reproductive: Prostate is unremarkable. Other: No abdominal wall hernia or abnormality. No abdominopelvic ascites. Musculoskeletal: Degenerative changes of the lumbar spine are seen. No acute bony abnormality is noted. IMPRESSION: Changes consistent with the  given clinical history of sclerosing cholangitis with right posterior [place] atrophy and prominence of the biliary tree. The overall appearance is stable from 2017. Scattered mildly prominent lymph nodes likely reactive in nature although short-term follow-up in 6 months is recommended to assess for stability. No new focal abnormality is identified. Electronically Signed   By: Inez Catalina M.D.   On: 11/04/2017 06:02   Dg Chest Port 1 View  Result Date: 11/04/2017 CLINICAL DATA:  Initial evaluation for acute fever, sepsis. EXAM: PORTABLE CHEST 1 VIEW COMPARISON:  Prior radiograph from 02/17/2014. FINDINGS: The cardiac and mediastinal silhouettes are stable in size and contour, and remain within normal limits. Aortic atherosclerosis. The lungs are normally inflated. Curvilinear scarring at the left upper lobe, stable. No airspace consolidation, pleural effusion, or pulmonary edema is identified. There is no pneumothorax. No acute osseous abnormality identified. IMPRESSION: 1. Mild left upper lobe scarring, stable. 2. No other active cardiopulmonary disease. Electronically Signed   By: Jeannine Boga M.D.   On: 11/04/2017 05:34     ____________________________________________   PROCEDURES  Procedure(s) performed: None  Procedures  Critical Care performed:   CRITICAL CARE Performed by: Paulette Blanch   Total critical care time: 45 minutes  Critical care time was exclusive of separately billable procedures and treating other patients.  Critical care was necessary to treat or prevent imminent or life-threatening deterioration.  Critical care was time spent personally by me on the following activities: development of treatment plan with patient and/or surrogate as well as nursing, discussions with consultants, evaluation of patient's response to treatment, examination of patient, obtaining history from patient or surrogate, ordering and performing treatments and interventions, ordering and review of laboratory studies, ordering and review of radiographic studies, pulse oximetry and re-evaluation of patient's condition.  ____________________________________________   INITIAL IMPRESSION / ASSESSMENT AND PLAN / ED COURSE  As part of my medical decision making, I reviewed the following data within the [facility] History obtained from family, Nursing notes reviewed and incorporated, Labs reviewed, Old chart reviewed, Radiograph reviewed  and Notes from prior ED visits   67 year old male who presents with fever and abdominal pain. Differential diagnosis includes, but is not limited to, acute appendicitis, renal colic, testicular torsion, urinary tract infection/pyelonephritis, prostatitis,  epididymitis, diverticulitis, small bowel obstruction or ileus, colitis, abdominal aortic aneurysm, gastroenteritis, hernia, etc.  Patient was brought back from triage upon his lactate returning at 6.0; renal insufficiency.  Minimally elevated transaminases with normal bilirubin.  Will initiate IV fluid resuscitation, 50 mcg IV fentanyl for pain paired with 4 mg IV Zofran for nausea.  Will proceed with CT  abdomen/pelvis to evaluate for intra-abdominal etiology.   Clinical Course as of Nov 05 726  Fri Nov 04, 2017  0444 Repeat temperature now 101 F. Will administer Tylenol and initiate ED Code Sepsis.   [JS]  [phone] Updated patient and spouse on CT imaging result.  Encouraged patient to provide urine specimen.  Anticipate hospitalization.   [JS]  6734 UA still pending.  Spoke with hospitalist services who will evaluate patient in the emergency department for admission.  Repeat lactate has only improved to 5.9   [JS]    Clinical Course User Index [JS] Paulette Blanch, MD     ____________________________________________   FINAL CLINICAL IMPRESSION(S) / ED DIAGNOSES  Final diagnoses:  Fever, unspecified fever cause  Right lower quadrant abdominal pain  Sepsis, due to unspecified organism     ED Discharge Orders    None  Note:  This document was prepared using Dragon voice recognition software and may include unintentional dictation errors.    Paulette Blanch, MD 11/04/17 [phone]

## 2017-11-04 NOTE — Progress Notes (Signed)
Advanced care plan.  Purpose of the Encounter: CODE STATUS  Parties in Attendance: Patient  Patient's Decision Capacity: Good  Subjective/Patient's story: Presented to the emergency room for abdominal pain and fever   Objective/Medical story Has sepsis and elevated lactic acid He is dehydrated Needs IV fluids and antibiotics   Goals of care determination:  Advance care directives and goals of care discussed with the patient Treatment plan discussed Patient wants everything done which includes CPR, intubation and ventilator if the need arises   CODE STATUS: Full code   Time spent discussing advanced care planning: 16 minutes

## 2017-11-04 NOTE — ED Notes (Signed)
Called Bed Placement to assign bed 132  [id number]

## 2017-11-04 NOTE — ED Triage Notes (Addendum)
Pt presents to ED with fever and right side pain since Thursday afternoon. Hx of the same and pt wife states pt becomes septic with these symptoms very quickly. Hx of hospitalization for the same about 4 years ago. Pt eyes closed in triage. Appears listless and skin very warm to the touch. Denies pain at this time.

## 2017-11-04 NOTE — ED Notes (Signed)
Charge RN aware of lactic. Moving to room.

## 2017-11-04 NOTE — H&P (Addendum)
[facility] [place] NAME: Jesse Thompson    MR#:  [id number]  DATE OF BIRTH:  1950-09-01  DATE OF ADMISSION:  11/04/2017  PRIMARY CARE PHYSICIAN: Adin Hector, MD   REQUESTING/REFERRING PHYSICIAN:   CHIEF COMPLAINT:   Chief Complaint  Patient presents with  . Fever  . Abdominal Pain    HISTORY OF PRESENT ILLNESS: Jesse Thompson  is a 67 y.o. male with a known history of sclerosing cholangitis, biliary duct stent placement, hyperlipidemia, hypertension, osteoarthritis, diabetes mellitus type 2, GERD presented to the emergency with abdominal pain and fever which has been going on for 1 day.  Abdominal pain is generalized aching in nature 5 out of 10 on a scale of 1-10.  No history of any dysuria, cough.  No vomiting of blood or rectal bleed.  No complaints of any chest pain, shortness of breath.  PAST MEDICAL HISTORY:   Past Medical History:  Diagnosis Date  . Bursitis   . Collagen vascular disease ([place])   . Diabetes mellitus without complication ([place])   . GERD (gastroesophageal reflux disease)   . Gout   . HOH (hard of hearing)    Bilateral Hearing Aids  . Hyperlipidemia   . Hypertension   . Osteoarthritis   . Osteoarthritis   . 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 Vertigo     PAST SURGICAL HISTORY:  Past Surgical History:  Procedure Laterality Date  . BILE DUCT STENT PLACEMENT  2015   now out  . CARPAL TUNNEL RELEASE Right 09/15/2016   Procedure: CARPAL TUNNEL RELEASE;  Surgeon: Earnestine Leys, MD;  Location: [facility] ORS;  Service: Orthopedics;  Laterality: Right;  . CARPAL TUNNEL RELEASE Left 09/29/2016   Procedure: CARPAL TUNNEL RELEASE;  Surgeon: Earnestine Leys, MD;  Location: [facility] ORS;  Service: Orthopedics;  Laterality: Left;  sutures removed right hand from status post carpal tunnel  . FRACTURE SURGERY Right    Ankle  . TOTAL THYROIDECTOMY      SOCIAL HISTORY:  Social History   Tobacco Use  . Smoking status:  Never Smoker  . Smokeless tobacco: Never Used  Substance Use Topics  . Alcohol use: No    Alcohol/week: 0.0 standard drinks    FAMILY HISTORY:  Family History  Problem Relation Age of Onset  . Hypertension Mother   . Hypertension Father     DRUG ALLERGIES:  Allergies  Allergen Reactions  . Metronidazole Itching  . Penicillin G Rash    Has patient had a PCN reaction causing immediate rash, facial/tongue/throat swelling, SOB or lightheadedness with hypotension: Unknown Has patient had a PCN reaction causing severe rash involving mucus membranes or skin necrosis: Unknown Has patient had a PCN reaction that required hospitalization: No Has patient had a PCN reaction occurring within the last 10 years: No If all of the above answers are "NO", then may proceed with Cephalosporin use.     REVIEW OF SYSTEMS:   CONSTITUTIONAL: Has fever, fatigue and weakness.  EYES: No blurred or double vision.  EARS, NOSE, AND THROAT: No tinnitus or ear pain.  RESPIRATORY: No cough, shortness of breath, wheezing or hemoptysis.  CARDIOVASCULAR: No chest pain, orthopnea, edema.  GASTROINTESTINAL: No nausea, vomiting, diarrhea  Has abdominal pain.  GENITOURINARY: No dysuria, hematuria.  ENDOCRINE: No polyuria, nocturia,  HEMATOLOGY: No anemia, easy bruising or bleeding SKIN: No rash or lesion. MUSCULOSKELETAL: No joint pain or arthritis.   NEUROLOGIC: No tingling, numbness, weakness.  PSYCHIATRY: No anxiety  or depression.   MEDICATIONS AT HOME:  Prior to Admission medications   Medication Sig Start Date End Date Taking? Authorizing Provider  allopurinol (ZYLOPRIM) 100 MG tablet Take 100 mg by mouth daily. 07/16/16  Yes [provider]  amLODipine (NORVASC) 10 MG tablet Take 10 mg by mouth daily. 09/12/17  Yes [provider]  bisoprolol (ZEBETA) 5 MG tablet Take 2.5 mg by mouth daily. 09/12/17  Yes [provider]  diclofenac sodium (VOLTAREN) 1 % GEL Apply 2 g topically 3  (three) times daily as needed for pain. 10/26/17  Yes [provider]  famotidine (PEPCID) 20 MG tablet Take 20 mg by mouth every evening. 03/01/17  Yes [provider]  gabapentin (NEURONTIN) 400 MG capsule Take 1 capsule (400 mg total) by mouth 2 (two) times daily. 09/15/16  Yes Earnestine Leys, MD  glipiZIDE (GLUCOTROL XL) 2.5 MG 24 hr tablet Take 2.5 mg by mouth daily. 07/29/16  Yes [provider]  levothyroxine (SYNTHROID, LEVOTHROID) 100 MCG tablet Take 100 mcg by mouth daily before breakfast. 07/16/16  Yes [provider]  meloxicam (MOBIC) 15 MG tablet Take 1 tablet (15 mg total) by mouth daily. Patient taking differently: Take 15 mg by mouth every evening.  09/15/16  Yes Earnestine Leys, MD  metFORMIN (GLUCOPHAGE) 1000 MG tablet Take 1,000 mg by mouth 2 (two) times daily. 07/16/16  Yes [provider]  Omega-3 Fatty Acids (FISH OIL) 1000 MG CAPS Take 1,000 mg by mouth daily.   Yes [provider]  omeprazole (PRILOSEC) 40 MG capsule Take 40 mg by mouth daily before breakfast.  06/05/14  Yes [provider]  sildenafil (VIAGRA) 100 MG tablet Take 100 mg by mouth daily as needed for erectile dysfunction.    Yes [provider]  valsartan-hydrochlorothiazide (DIOVAN-HCT) 320-25 MG tablet Take 1 tablet by mouth daily. 07/16/16  Yes [provider]  vitamin B-12 (CYANOCOBALAMIN) 1000 MCG tablet Take 1,000 mcg by mouth daily.   Yes [provider]  HYDROcodone-acetaminophen (NORCO) 5-325 MG tablet Take 1-2 tablets by mouth every 6 (six) hours as needed. Patient not taking: Reported on 11/04/2017 09/15/16   Earnestine Leys, MD      PHYSICAL EXAMINATION:   VITAL SIGNS: Blood pressure 108/64, pulse 85, temperature 98.6 F (37 C), temperature source Oral, resp. rate 20, height 5\' 6"  (1.676 m), weight 86.2 kg, SpO2 93 %.  GENERAL:  67 y.o.-year-old patient lying in the bed with no acute distress.  EYES: Pupils equal, round,  reactive to light and accommodation. No scleral icterus. Extraocular muscles intact.  HEENT: Head atraumatic, normocephalic. Oropharynx and nasopharynx clear.  NECK:  Supple, no jugular venous distention. No thyroid enlargement, no tenderness.  LUNGS: Normal breath sounds bilaterally, no wheezing, rales,rhonchi or crepitation. No use of accessory muscles of respiration.  CARDIOVASCULAR: S1, S2 normal. No murmurs, rubs, or gallops.  ABDOMEN: Soft, tenderness present around umbilicus , nondistended. Bowel sounds present. No organomegaly or mass.  EXTREMITIES: No pedal edema, cyanosis, or clubbing.  NEUROLOGIC: Cranial nerves II through XII are intact. Muscle strength 5/5 in all extremities. Sensation intact. Gait not checked.  PSYCHIATRIC: The patient is alert and oriented x 3.  SKIN: No obvious rash, lesion, or ulcer.   LABORATORY PANEL:   CBC Recent Labs  Lab 11/04/17 0250  WBC 4.2  HGB 12.5*  HCT 37.2*  PLT 105*  MCV 82.2  MCH 27.6  MCHC 33.6  RDW 16.0*  LYMPHSABS 0.3*  MONOABS 0.0*  EOSABS 0.0  BASOSABS 0.0   ------------------------------------------------------------------------------------------------------------------  Chemistries  Recent Labs  Lab 11/04/17 0250  NA 140  K 3.4*  CL 108  CO2 20*  GLUCOSE 121*  BUN 21  CREATININE 1.48*  CALCIUM 8.7*  AST 64*  ALT 45*  ALKPHOS 89  BILITOT 0.9   ------------------------------------------------------------------------------------------------------------------ estimated creatinine clearance is 49.2 mL/min (A) (by C-G formula based on SCr of 1.48 mg/dL (H)). ------------------------------------------------------------------------------------------------------------------ No results for input(s): TSH, T4TOTAL, T3FREE, THYROIDAB in the last 72 hours.  Invalid input(s): FREET3   Coagulation profile No results for input(s): INR, PROTIME in the last 168  hours. ------------------------------------------------------------------------------------------------------------------- No results for input(s): DDIMER in the last 72 hours. -------------------------------------------------------------------------------------------------------------------  Cardiac Enzymes No results for input(s): CKMB, TROPONINI, MYOGLOBIN in the last 168 hours.  Invalid input(s): CK ------------------------------------------------------------------------------------------------------------------ Invalid input(s): POCBNP  ---------------------------------------------------------------------------------------------------------------  Urinalysis    Component Value Date/Time   COLORURINE YELLOW (A) 11/04/2017 0657   APPEARANCEUR CLEAR (A) 11/04/2017 0657   APPEARANCEUR Clear 02/18/2014 2217   LABSPEC 1.043 (H) 11/04/2017 0657   LABSPEC 1.018 02/18/2014 2217   PHURINE 5.0 11/04/2017 0657   GLUCOSEU NEGATIVE 11/04/2017 0657   GLUCOSEU 50 mg/dL 02/18/2014 2217   HGBUR NEGATIVE 11/04/2017 0657   BILIRUBINUR NEGATIVE 11/04/2017 0657   BILIRUBINUR Negative 02/18/2014 2217   KETONESUR NEGATIVE 11/04/2017 0657   PROTEINUR NEGATIVE 11/04/2017 0657   NITRITE NEGATIVE 11/04/2017 0657   LEUKOCYTESUR NEGATIVE 11/04/2017 0657   LEUKOCYTESUR Negative 02/18/2014 2217     RADIOLOGY: Ct Abdomen Pelvis W Contrast  Result Date: 11/04/2017 CLINICAL DATA:  Fever and right-sided abdominal pain EXAM: CT ABDOMEN AND PELVIS WITH CONTRAST TECHNIQUE: Multidetector CT imaging of the abdomen and pelvis was performed using the standard protocol following bolus administration of intravenous contrast. CONTRAST:  125mL ISOVUE-300 IOPAMIDOL (ISOVUE-300) INJECTION 61% COMPARISON:  MRI from 09/22/2015 and prior CT from 10/24/2012 FINDINGS: Lower chest: Lung bases are free of acute infiltrate or sizable effusion. Hepatobiliary: The liver again demonstrates some atrophic changes in the right lobe  with a somewhat mild prominence of the biliary tree in the posterior aspect of the right lobe of the liver similar to that seen on prior MRI examination and again consistent with the patient's given clinical history of sclerosing cholangitis. No abnormal enhancement in the liver is identified. The gallbladder is decompressed. The previously seen biliary stent is no longer present. Pancreas: Unremarkable. No pancreatic ductal dilatation or surrounding inflammatory changes. Spleen: Normal in size without focal abnormality. Adrenals/Urinary Tract: Adrenal glands are within normal limits. Kidneys are well visualized bilaterally. No renal calculi or obstructive changes are seen. Dominant right upper pole cyst is noted stable from the prior MRI examination. No ureteral calculi are seen. The bladder is decompressed. Stomach/Bowel: The appendix is well visualized and within normal limits. Colon demonstrates scattered fecal material without obstructive change. No small bowel obstructive change is seen. Vascular/Lymphatic: Atherosclerotic calcifications are noted without aneurysmal dilatation. Few scattered retroperitoneal lymph nodes are noted not significant by size criteria and relatively stable from previous exams. A 16 mm node is noted between the IVC and aorta on image number 31 of series 2 increased in size when compared with the prior MRI examination at which time it measured just under 9 mm. Stable gastrohepatic lymph node is noted as well. Reproductive: Prostate is unremarkable. Other: No abdominal wall hernia or abnormality. No abdominopelvic ascites. Musculoskeletal: Degenerative changes of the lumbar spine are seen. No acute bony abnormality is noted. IMPRESSION: Changes consistent with the given clinical history of sclerosing cholangitis  with right posterior Patti Clobex atrophy and prominence of the biliary tree. The overall appearance is stable from 2017. Scattered mildly prominent lymph nodes likely reactive  in nature although short-term follow-up in 6 months is recommended to assess for stability. No new focal abnormality is identified. Electronically Signed   By: Inez Catalina M.D.   On: 11/04/2017 06:02   Dg Chest Port 1 View  Result Date: 11/04/2017 CLINICAL DATA:  Initial evaluation for acute fever, sepsis. EXAM: PORTABLE CHEST 1 VIEW COMPARISON:  Prior radiograph from 02/17/2014. FINDINGS: The cardiac and mediastinal silhouettes are stable in size and contour, and remain within normal limits. Aortic atherosclerosis. The lungs are normally inflated. Curvilinear scarring at the left upper lobe, stable. No airspace consolidation, pleural effusion, or pulmonary edema is identified. There is no pneumothorax. No acute osseous abnormality identified. IMPRESSION: 1. Mild left upper lobe scarring, stable. 2. No other active cardiopulmonary disease. Electronically Signed   By: Jeannine Boga M.D.   On: 11/04/2017 05:34    EKG: Orders placed or performed during the hospital encounter of 11/04/17  . ED EKG  . ED EKG    IMPRESSION AND PLAN:  67 year old male patient with history of biliary duct placement, sclerosing cholangitis, GERD, hypertension, hyperlipidemia, type 2 diabetes mellitus presented to the emergency room with abdominal discomfort and fever  -Sepsis Start patient on IV vancomycin and zosyn antibiotics Follow-up cultures  -Elevated lactic acid Secondary to sepsis versus dehydration IV fluids  -History of sclerosing cholangitis and biliary duct stent placement CT abdomen shows no acute changes GI consult  -Transaminitis Monitor liver function tests  -GERD Continue proton pump inhibitor  -DVT prophylaxis subcu Lovenox daily  All the records are reviewed and case discussed with ED provider. Management plans discussed with the patient, family and they are in agreement.  CODE STATUS:Full code Advance Directive Documentation     Most Recent Value  Type of Advance  Directive  Healthcare Power of Attorney, Living will  Pre-existing out of facility DNR order (yellow form or pink MOST form)  -  "MOST" Form in Place?  -       TOTAL TIME TAKING CARE OF THIS PATIENT: 51 minutes.    Saundra Shelling M.D on 11/04/2017 at 8:28 AM  Between 7am to 6pm - Pager - [phone]  After 6pm go to [email or web address] - password [email or web address] [facility]  [place] [facility]  Office  [phone]  CC: Primary care physician; Adin Hector, MD

## 2017-11-04 NOTE — Progress Notes (Signed)
CODE SEPSIS - PHARMACY COMMUNICATION  **Broad Spectrum Antibiotics should be administered within 1 hour of Sepsis diagnosis**  Time Code Sepsis Called/Page Received: 6122  Antibiotics Ordered: vanc/aztreonam  Time of 1st antibiotic administration: 0445  Additional action taken by pharmacy:   If necessary, Name of Provider/Nurse Contacted:     Tobie Lords ,PharmD Clinical Pharmacist  11/04/2017  5:44 AM

## 2017-11-04 NOTE — ED Notes (Signed)
Pt c/o nausea at this time. Medication administered per protocol.

## 2017-11-04 NOTE — Progress Notes (Signed)
Pharmacy Antibiotic Note  Jesse Thompson is a 67 y.o. male admitted on 11/04/2017 with sepsis.  Pharmacy has been consulted for vanc/aztreonam dosing.  Plan: Will continue vanc 750 mg IV q12h w/ 6 hour stack  Will draw trough prior to 4th dose. Will continue aztreonam 1g IV q12h (PCN allx not real please try to get switched)  Ke 0.0452 T1/2 ~ 12 hrs Goal trough 15 - 20 mcg/mL  Height: 5\' 6"  (167.6 cm) Weight: 190 lb (86.2 kg) IBW/kg (Calculated) : 63.8  Temp (24hrs), Avg:99.6 F (37.6 C), Min:99.6 F (37.6 C), Max:99.6 F (37.6 C)  Recent Labs  Lab 11/04/17 0250  WBC 4.2  CREATININE 1.48*  LATICACIDVEN 6.0*    Estimated Creatinine Clearance: 49.2 mL/min (A) (by C-G formula based on SCr of 1.48 mg/dL (H)).    Allergies  Allergen Reactions  . Metronidazole Itching  . Penicillin G Rash    Has patient had a PCN reaction causing immediate rash, facial/tongue/throat swelling, SOB or lightheadedness with hypotension: Unknown Has patient had a PCN reaction causing severe rash involving mucus membranes or skin necrosis: Unknown Has patient had a PCN reaction that required hospitalization: No Has patient had a PCN reaction occurring within the last 10 years: No If all of the above answers are "NO", then may proceed with Cephalosporin use.     Thank you for allowing pharmacy to be a part of this patient's care.  Tobie Lords, PharmD, BCPS Clinical Pharmacist 11/04/2017

## 2017-11-05 LAB — BASIC METABOLIC PANEL
Anion gap: 7 (ref 5–15)
BUN: 29 mg/dL — ABNORMAL HIGH (ref 8–23)
CHLORIDE: 109 mmol/L (ref 98–111)
CO2: 23 mmol/L (ref 22–32)
CREATININE: 1.71 mg/dL — AB (ref 0.61–1.24)
Calcium: 7.2 mg/dL — ABNORMAL LOW (ref 8.9–10.3)
GFR calc non Af Amer: 39 mL/min — ABNORMAL LOW (ref >60)
GFR, EST AFRICAN AMERICAN: 46 mL/min — AB (ref >60)
Glucose, Bld: 162 mg/dL — ABNORMAL HIGH (ref 70–99)
Potassium: 4.5 mmol/L (ref 3.5–5.1)
Sodium: 139 mmol/L (ref 135–145)

## 2017-11-05 LAB — CBC
HCT: 31.4 % — ABNORMAL LOW (ref 40.0–52.0)
Hemoglobin: 10.5 g/dL — ABNORMAL LOW (ref 13.0–18.0)
MCH: 28 pg (ref 26.0–34.0)
MCHC: 33.3 g/dL (ref 32.0–36.0)
MCV: 83.9 fL (ref 80.0–100.0)
Platelets: 58 10*3/uL — ABNORMAL LOW (ref 150–440)
RBC: 3.75 MIL/uL — AB (ref 4.40–5.90)
RDW: 16.6 % — ABNORMAL HIGH (ref 11.5–14.5)
WBC: 10.5 10*3/uL (ref 3.8–10.6)

## 2017-11-05 LAB — LACTIC ACID, PLASMA
Lactic Acid, Venous: 2.1 mmol/L (ref 0.5–1.9)
Lactic Acid, Venous: 2.1 mmol/L (ref 0.5–1.9)

## 2017-11-05 LAB — HIV ANTIBODY (ROUTINE TESTING W REFLEX): HIV Screen 4th Generation wRfx: NONREACTIVE

## 2017-11-05 MED ORDER — INSULIN ASPART 100 UNIT/ML ~~LOC~~ SOLN: 0.0000 [IU] | Freq: Three times a day (TID) | SUBCUTANEOUS | Status: DC

## 2017-11-05 NOTE — Progress Notes (Signed)
Pt refused night time FSBS, educated and pt repeatedly states he only checkes his sugar at home 2x per week.

## 2017-11-05 NOTE — Consult Note (Signed)
[facility] GI Inpatient Consult Note   Kathline Magic, M.D.  Reason for Consult: abdominal pain   Attending Requesting Consult: Bettey Costa, M.D.  Outpatient Primary Physician: Ramonita Lab, M.D.  History of Present Illness: Jesse Thompson is a 67 y.o. 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  Last biliary stent placement was in 2015 by Dr. Verdie Shire, previously of [facility].  The stent placement, however, was complicated by what appeared to be acalculous cholecystitis requiring percutaneous cholecystostomy drain.  Patient has had no incidents since this time and has not had any need for repeated ERCPs. Previously, the patient had ERCP's at [facility]. He has been seen reportedly by Dr. Tempie Hoist at [facility] and was not offered a liver transplant as the patient's liver function was and continues to be adequate.  The patient was admitted to the hospital with fever and diffuse abdominal discomfort.  He has a history of type 2 diabetes mellitus without any symptoms of chest pain shortness of breath or palpitations. CT scan abdomen appears to show no acute changes in the biliary ducts persistent intermittent dilatation of the intrahepatic ducts consistent with patient's history of sclerosing cholangitis.  Liver enzymes have been in the normal range.  Patient had a temperature of 100.8 earlier today.  Some cultures are pending. Patient says he has some mild pain in the right upper and right lower quadrant which he believes he got from lifting something heavy at his house the other day.  Past Medical History:  Past Medical History:  Diagnosis Date  . Bursitis   . Collagen vascular disease ([place])   . Diabetes mellitus without complication ([place])   . GERD (gastroesophageal reflux disease)   . Gout   . HOH (hard of hearing)    Bilateral Hearing Aids  . Hyperlipidemia   . Hypertension   . Osteoarthritis   .  Osteoarthritis   . 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 Vertigo     Problem List: Patient Active Problem List   Diagnosis Date Noted  . Sepsis ([place]) 11/04/2017  . Osteoarthritis     Past Surgical History: Past Surgical History:  Procedure Laterality Date  . BILE DUCT STENT PLACEMENT  2015   now out  . CARPAL TUNNEL RELEASE Right 09/15/2016   Procedure: CARPAL TUNNEL RELEASE;  Surgeon: Earnestine Leys, MD;  Location: [facility] ORS;  Service: Orthopedics;  Laterality: Right;  . CARPAL TUNNEL RELEASE Left 09/29/2016   Procedure: CARPAL TUNNEL RELEASE;  Surgeon: Earnestine Leys, MD;  Location: [facility] ORS;  Service: Orthopedics;  Laterality: Left;  sutures removed right hand from status post carpal tunnel  . FRACTURE SURGERY Right    Ankle  . TOTAL THYROIDECTOMY      Allergies: Allergies  Allergen Reactions  . Metronidazole Itching  . Penicillin G Rash    Has patient had a PCN reaction causing immediate rash, facial/tongue/throat swelling, SOB or lightheadedness with hypotension: Unknown Has patient had a PCN reaction causing severe rash involving mucus membranes or skin necrosis: Unknown Has patient had a PCN reaction that required hospitalization: No Has patient had a PCN reaction occurring within the last 10 years: No If all of the above answers are "NO", then may proceed with Cephalosporin use.     Home Medications: Medications Prior to Admission  Medication Sig Dispense Refill Last Dose  . allopurinol (ZYLOPRIM) 100 MG tablet Take 100 mg by mouth daily.   11/03/2017 at Unknown time  .  amLODipine (NORVASC) 10 MG tablet Take 10 mg by mouth daily.   11/03/2017 at Unknown time  . bisoprolol (ZEBETA) 5 MG tablet Take 2.5 mg by mouth daily.   11/03/2017 at Unknown time  . diclofenac sodium (VOLTAREN) 1 % GEL Apply 2 g topically 3 (three) times daily as needed for pain.  11 11/03/2017 at Unknown time  . famotidine (PEPCID) 20 MG tablet Take 20 mg by mouth every evening.   11/03/2017 at Unknown time  .  gabapentin (NEURONTIN) 400 MG capsule Take 1 capsule (400 mg total) by mouth 2 (two) times daily. 60 capsule 3 11/03/2017 at Unknown time  . glipiZIDE (GLUCOTROL XL) 2.5 MG 24 hr tablet Take 2.5 mg by mouth daily.   11/03/2017 at Unknown time  . levothyroxine (SYNTHROID, LEVOTHROID) 100 MCG tablet Take 100 mcg by mouth daily before breakfast.   11/03/2017 at Unknown time  . meloxicam (MOBIC) 15 MG tablet Take 1 tablet (15 mg total) by mouth daily. (Patient taking differently: Take 15 mg by mouth every evening. ) 30 tablet 3 11/03/2017 at Unknown time  . metFORMIN (GLUCOPHAGE) 1000 MG tablet Take 1,000 mg by mouth 2 (two) times daily.   11/03/2017 at Unknown time  . Omega-3 Fatty Acids (FISH OIL) 1000 MG CAPS Take 1,000 mg by mouth daily.   UNKNOWN at UNKNOWN  . omeprazole (PRILOSEC) 40 MG capsule Take 40 mg by mouth daily before breakfast.    11/03/2017 at Unknown time  . sildenafil (VIAGRA) 100 MG tablet Take 100 mg by mouth daily as needed for erectile dysfunction.    PRN at PRN  . valsartan-hydrochlorothiazide (DIOVAN-HCT) 320-25 MG tablet Take 1 tablet by mouth daily.   11/03/2017 at Unknown time  . vitamin B-12 (CYANOCOBALAMIN) 1000 MCG tablet Take 1,000 mcg by mouth daily.   11/03/2017 at Unknown time  . HYDROcodone-acetaminophen (NORCO) 5-325 MG tablet Take 1-2 tablets by mouth every 6 (six) hours as needed. (Patient not taking: Reported on 11/04/2017) 50 tablet 0 Completed Course at Unknown time   Home medication reconciliation was completed with the patient.   Scheduled Inpatient Medications:   . allopurinol  100 mg Oral Daily  . amLODipine  10 mg Oral Daily  . bisoprolol  2.5 mg Oral Daily  . gabapentin  400 mg Oral BID  . glipiZIDE  2.5 mg Oral Daily  . Influenza vac split quadrivalent PF  0.5 mL Intramuscular Tomorrow-1000  . insulin aspart  0-9 Units Subcutaneous TID WC  . levothyroxine  100 mcg Oral QAC breakfast  . omega-3 acid ethyl esters  1 g Oral Daily  . pantoprazole  40 mg Oral  Daily  . vitamin B-12  1,000 mcg Oral Daily    Continuous Inpatient Infusions:   . sodium chloride 125 mL/hr at 11/05/17 0543  . piperacillin-tazobactam (ZOSYN)  IV 3.375 g (11/05/17 1132)    PRN Inpatient Medications:  acetaminophen **OR** acetaminophen, ALPRAZolam, ibuprofen, ondansetron **OR** ondansetron (ZOFRAN) IV, senna-docusate  Family History: family history includes Hypertension in his father and mother.   GI Family History: Negative  Social History:   reports that he has never smoked. He has never used smokeless tobacco. He reports that he does not drink alcohol or use drugs. The patient denies ETOH, tobacco, or drug use.    Review of Systems: Review of Systems - History obtained from the patient and and spouse   Physical Examination: BP 96/61 (BP Location: Right Arm)   Pulse 73   Temp 98 F (36.7 C)  Resp 18   Ht 5\' 6"  (1.676 m)   Wt 86.2 kg   SpO2 93%   BMI 30.67 kg/m  Physical Exam  Constitutional: He appears well-developed and well-nourished.  HENT:  Head: Normocephalic and atraumatic.  Eyes: No scleral icterus.  Cardiovascular: Normal rate and regular rhythm.  Pulmonary/Chest: Effort normal and breath sounds normal.  Abdominal: Soft. Normal appearance and bowel sounds are normal. There is generalized tenderness. There is no rebound and no guarding. No hernia.  Skin: Skin is warm and dry.    Data: Lab Results  Component Value Date   WBC 10.5 11/05/2017   HGB 10.5 (L) 11/05/2017   HCT 31.4 (L) 11/05/2017   MCV 83.9 11/05/2017   PLT 58 (L) 11/05/2017   Recent Labs  Lab 11/04/17 0250 11/05/17 0030  HGB 12.5* 10.5*   Lab Results  Component Value Date   NA 139 11/05/2017   K 4.5 11/05/2017   CL 109 11/05/2017   CO2 23 11/05/2017   BUN 29 (H) 11/05/2017   CREATININE 1.71 (H) 11/05/2017   Lab Results  Component Value Date   ALT 45 (H) 11/04/2017   AST 64 (H) 11/04/2017   ALKPHOS 89 11/04/2017   BILITOT 0.9 11/04/2017   No results  for input(s): APTT, INR, PTT in the last 168 hours. CBC Latest Ref Rng & Units 11/05/2017 11/04/2017 09/06/2016  WBC 3.8 - 10.6 K/uL 10.5 4.2 4.9  Hemoglobin 13.0 - 18.0 g/dL 10.5(L) 12.5(L) 12.9(L)  Hematocrit 40.0 - 52.0 % 31.4(L) 37.2(L) 39.7(L)  Platelets 150 - 440 K/uL 58(L) 105(L) 133(L)    STUDIES: Ct Abdomen Pelvis W Contrast  Result Date: 11/04/2017 CLINICAL DATA:  Fever and right-sided abdominal pain EXAM: CT ABDOMEN AND PELVIS WITH CONTRAST TECHNIQUE: Multidetector CT imaging of the abdomen and pelvis was performed using the standard protocol following bolus administration of intravenous contrast. CONTRAST:  191mL ISOVUE-300 IOPAMIDOL (ISOVUE-300) INJECTION 61% COMPARISON:  MRI from 09/22/2015 and prior CT from 10/24/2012 FINDINGS: Lower chest: Lung bases are free of acute infiltrate or sizable effusion. Hepatobiliary: The liver again demonstrates some atrophic changes in the right lobe with a somewhat mild prominence of the biliary tree in the posterior aspect of the right lobe of the liver similar to that seen on prior MRI examination and again consistent with the patient's given clinical history of sclerosing cholangitis. No abnormal enhancement in the liver is identified. The gallbladder is decompressed. The previously seen biliary stent is no longer present. Pancreas: Unremarkable. No pancreatic ductal dilatation or surrounding inflammatory changes. Spleen: Normal in size without focal abnormality. Adrenals/Urinary Tract: Adrenal glands are within normal limits. Kidneys are well visualized bilaterally. No renal calculi or obstructive changes are seen. Dominant right upper pole cyst is noted stable from the prior MRI examination. No ureteral calculi are seen. The bladder is decompressed. Stomach/Bowel: The appendix is well visualized and within normal limits. Colon demonstrates scattered fecal material without obstructive change. No small bowel obstructive change is seen. Vascular/Lymphatic:  Atherosclerotic calcifications are noted without aneurysmal dilatation. Few scattered retroperitoneal lymph nodes are noted not significant by size criteria and relatively stable from previous exams. A 16 mm node is noted between the IVC and aorta on image number 31 of series 2 increased in size when compared with the prior MRI examination at which time it measured just under 9 mm. Stable gastrohepatic lymph node is noted as well. Reproductive: Prostate is unremarkable. Other: No abdominal wall hernia or abnormality. No abdominopelvic ascites. Musculoskeletal: Degenerative changes of  the lumbar spine are seen. No acute bony abnormality is noted. IMPRESSION: Changes consistent with the given clinical history of sclerosing cholangitis with right posterior Patti Clobex atrophy and prominence of the biliary tree. The overall appearance is stable from 2017. Scattered mildly prominent lymph nodes likely reactive in nature although short-term follow-up in 6 months is recommended to assess for stability. No new focal abnormality is identified. Electronically Signed   By: Inez Catalina M.D.   On: 11/04/2017 06:02   Dg Chest Port 1 View  Result Date: 11/04/2017 CLINICAL DATA:  Initial evaluation for acute fever, sepsis. EXAM: PORTABLE CHEST 1 VIEW COMPARISON:  Prior radiograph from 02/17/2014. FINDINGS: The cardiac and mediastinal silhouettes are stable in size and contour, and remain within normal limits. Aortic atherosclerosis. The lungs are normally inflated. Curvilinear scarring at the left upper lobe, stable. No airspace consolidation, pleural effusion, or pulmonary edema is identified. There is no pneumothorax. No acute osseous abnormality identified. IMPRESSION: 1. Mild left upper lobe scarring, stable. 2. No other active cardiopulmonary disease. Electronically Signed   By: Jeannine Boga M.D.   On: 11/04/2017 05:34   @IMAGES @  Assessment: 1.  Sepsis with fever-patient on Zosyn and vancomycin with  symptomatic improvement.  Still with intermittent fevers.  Focus is unknown at this time although it does not appear to be arising from the biliary system.  Chest x-ray as well as blood cultures have been negative.  Further work-up is pending.  Recommendations: 1.  Continue treatment of fever and further search for focus of infection.  Will follow along.  Thank you for the consult. Please call with questions or concerns.  Olean Ree, "Lanny Hurst MD [facility] [place], [place] [address] [phone]  11/05/2017 4:49 PM

## 2017-11-05 NOTE — Progress Notes (Signed)
[facility] [place] NAME: Jesse Thompson    MR#:  [id number]  DATE OF BIRTH:  05/19/1950  SUBJECTIVE:   patient presented with abdominal pain  REVIEW OF SYSTEMS:    Review of Systems  Constitutional: Negative for fever, chills weight loss HENT: Negative for ear pain, nosebleeds, congestion, facial swelling, rhinorrhea, neck pain, neck stiffness and ear discharge.   Respiratory: Negative for cough, shortness of breath, wheezing  Cardiovascular: Negative for chest pain, palpitations and leg swelling.  Gastrointestinal: Negative for heartburn, abdominal pain, vomiting, diarrhea or consitpation Genitourinary: Negative for dysuria, urgency, frequency, hematuria Musculoskeletal: Negative for back pain or joint pain Neurological: Negative for dizziness, seizures, syncope, focal weakness,  numbness and headaches.  Hematological: Does not bruise/bleed easily.  Psychiatric/Behavioral: Negative for hallucinations, confusion, dysphoric mood    Tolerating Diet: yes      DRUG ALLERGIES:   Allergies  Allergen Reactions  . Metronidazole Itching  . Penicillin G Rash    Has patient had a PCN reaction causing immediate rash, facial/tongue/throat swelling, SOB or lightheadedness with hypotension: Unknown Has patient had a PCN reaction causing severe rash involving mucus membranes or skin necrosis: Unknown Has patient had a PCN reaction that required hospitalization: No Has patient had a PCN reaction occurring within the last 10 years: No If all of the above answers are "NO", then may proceed with Cephalosporin use.     VITALS:  Blood pressure 96/61, pulse 73, temperature 98 F (36.7 C), resp. rate 18, height 5\' 6"  (1.676 m), weight 86.2 kg, SpO2 93 %.  PHYSICAL EXAMINATION:  Constitutional: Appears well-developed and well-nourished. No distress. HENT: Normocephalic. Marland Kitchen Oropharynx is clear and moist.  Eyes: Conjunctivae and EOM are normal.  PERRLA, no scleral icterus.  Neck: Normal ROM. Neck supple. No JVD. No tracheal deviation. CVS: RRR, S1/S2 +, no murmurs, no gallops, no carotid bruit.  Pulmonary: Effort and breath sounds normal, no stridor, rhonchi, wheezes, rales.  Abdominal: Soft. BS +,  no distension, tenderness, rebound or guarding.  Musculoskeletal: Normal range of motion. No edema and no tenderness.  Neuro: Alert. CN 2-12 grossly intact. No focal deficits. Skin: Skin is warm and dry. No rash noted. Psychiatric: Normal mood and affect.      LABORATORY PANEL:   CBC Recent Labs  Lab 11/05/17 0030  WBC 10.5  HGB 10.5*  HCT 31.4*  PLT 58*   ------------------------------------------------------------------------------------------------------------------  Chemistries  Recent Labs  Lab 11/04/17 0250 11/05/17 0030  NA 140 139  K 3.4* 4.5  CL 108 109  CO2 20* 23  GLUCOSE 121* 162*  BUN 21 29*  CREATININE 1.48* 1.71*  CALCIUM 8.7* 7.2*  AST 64*  --   ALT 45*  --   ALKPHOS 89  --   BILITOT 0.9  --    ------------------------------------------------------------------------------------------------------------------  Cardiac Enzymes No results for input(s): TROPONINI in the last 168 hours. ------------------------------------------------------------------------------------------------------------------  RADIOLOGY:  Ct Abdomen Pelvis W Contrast  Result Date: 11/04/2017 CLINICAL DATA:  Fever and right-sided abdominal pain EXAM: CT ABDOMEN AND PELVIS WITH CONTRAST TECHNIQUE: Multidetector CT imaging of the abdomen and pelvis was performed using the standard protocol following bolus administration of intravenous contrast. CONTRAST:  134mL ISOVUE-300 IOPAMIDOL (ISOVUE-300) INJECTION 61% COMPARISON:  MRI from 09/22/2015 and prior CT from 10/24/2012 FINDINGS: Lower chest: Lung bases are free of acute infiltrate or sizable effusion. Hepatobiliary: The liver again demonstrates some atrophic changes in the right  lobe with a somewhat mild prominence of the biliary  tree in the posterior aspect of the right lobe of the liver similar to that seen on prior MRI examination and again consistent with the patient's given clinical history of sclerosing cholangitis. No abnormal enhancement in the liver is identified. The gallbladder is decompressed. The previously seen biliary stent is no longer present. Pancreas: Unremarkable. No pancreatic ductal dilatation or surrounding inflammatory changes. Spleen: Normal in size without focal abnormality. Adrenals/Urinary Tract: Adrenal glands are within normal limits. Kidneys are well visualized bilaterally. No renal calculi or obstructive changes are seen. Dominant right upper pole cyst is noted stable from the prior MRI examination. No ureteral calculi are seen. The bladder is decompressed. Stomach/Bowel: The appendix is well visualized and within normal limits. Colon demonstrates scattered fecal material without obstructive change. No small bowel obstructive change is seen. Vascular/Lymphatic: Atherosclerotic calcifications are noted without aneurysmal dilatation. Few scattered retroperitoneal lymph nodes are noted not significant by size criteria and relatively stable from previous exams. A 16 mm node is noted between the IVC and aorta on image number 31 of series 2 increased in size when compared with the prior MRI examination at which time it measured just under 9 mm. Stable gastrohepatic lymph node is noted as well. Reproductive: Prostate is unremarkable. Other: No abdominal wall hernia or abnormality. No abdominopelvic ascites. Musculoskeletal: Degenerative changes of the lumbar spine are seen. No acute bony abnormality is noted. IMPRESSION: Changes consistent with the given clinical history of sclerosing cholangitis with right posterior Patti Clob[place] atrophy and prominence of the biliary tree. The overall appearance is stable from 2017. Scattered mildly prominent lymph nodes likely  reactive in nature although short-term follow-up in 6 months is recommended to assess for stability. No new focal abnormality is identified. Electronically Signed   By: Inez Catalina M.D.   On: 11/04/2017 06:02   Dg Chest Port 1 View  Result Date: 11/04/2017 CLINICAL DATA:  Initial evaluation for acute fever, sepsis. EXAM: PORTABLE CHEST 1 VIEW COMPARISON:  Prior radiograph from 02/17/2014. FINDINGS: The cardiac and mediastinal silhouettes are stable in size and contour, and remain within normal limits. Aortic atherosclerosis. The lungs are normally inflated. Curvilinear scarring at the left upper lobe, stable. No airspace consolidation, pleural effusion, or pulmonary edema is identified. There is no pneumothorax. No acute osseous abnormality identified. IMPRESSION: 1. Mild left upper lobe scarring, stable. 2. No other active cardiopulmonary disease. Electronically Signed   By: Jeannine Boga M.D.   On: 11/04/2017 05:34     ASSESSMENT AND PLAN:   *67 year old male with history of sclerosing cholangitis and biliary duct stent placement who presented to the emergency room due to fever and abdominal pain.  1.  Sepsis: Patient presents with fever and leukocytosis Etiology of sepsis is unclear as CT of the abdomen shows known sclerosing cholangitis and no acute pathology.  Chest x-ray shows no active disease.  Urinalysis was negative.  Blood cultures thus far are negative. Continue broad-spectrum antibiotics including Zosyn. CBC and lactic acid level are improving  2.  Diabetes: Continue ADA diet with glipizide and sliding scale  3.  Essential hypertension: Continue Norvasc and bisoprolol   4.  Hypothyroidism: Continue Synthroid  5.  Thrombocytopenia: This is likely due to sepsis Discontinue Lovenox CBC for a.m. Oncology evaluation   Management plans discussed with the patient and he is in agreement.  CODE STATUS: FULL  TOTAL TIME TAKING CARE OF THIS PATIENT: 30 minutes.      POSSIBLE D/C 1-2 days, DEPENDING ON CLINICAL CONDITION.   Maico Mulvehill  M.D on 11/05/2017 at 11:55 AM  Between 7am to 6pm - Pager - [phone] After 6pm go to [email or web address] - password [email or web address] [facility]  Office  [phone]  CC: Primary care physician; Adin Hector, MD  Note: This dictation was prepared with Dragon dictation along with smaller phrase technology. Any transcriptional errors that result from this process are unintentional.

## 2017-11-05 NOTE — Progress Notes (Signed)
Pt manages blood sugar at home via prescribed glipizide and metformin. Not currently being given metformin. Pt has been given contrast with in the the last 48 hrs. Pt currently being given glipizide daily. Pt wishes only glipizide to be given until metformin can be resumed. Pt wishes no other forms of medication be given. MD paged discontinuation of order requested

## 2017-11-06 LAB — COMPREHENSIVE METABOLIC PANEL
ALBUMIN: 3 g/dL — AB (ref 3.5–5.0)
ALK PHOS: 55 U/L (ref 38–126)
ALT: 51 U/L — AB (ref 0–44)
AST: 51 U/L — AB (ref 15–41)
Anion gap: 6 (ref 5–15)
BILIRUBIN TOTAL: 0.8 mg/dL (ref 0.3–1.2)
BUN: 31 mg/dL — AB (ref 8–23)
CALCIUM: 7.5 mg/dL — AB (ref 8.9–10.3)
CO2: 24 mmol/L (ref 22–32)
CREATININE: 1.32 mg/dL — AB (ref 0.61–1.24)
Chloride: 113 mmol/L — ABNORMAL HIGH (ref 98–111)
GFR calc Af Amer: >60 mL/min (ref >60)
GFR calc non Af Amer: 54 mL/min — ABNORMAL LOW (ref >60)
GLUCOSE: 128 mg/dL — AB (ref 70–99)
Potassium: 3.9 mmol/L (ref 3.5–5.1)
Sodium: 143 mmol/L (ref 135–145)
TOTAL PROTEIN: 5.6 g/dL — AB (ref 6.5–8.1)

## 2017-11-06 MED ORDER — AMOXICILLIN-POT CLAVULANATE 875-125 MG PO TABS: 1.0000 | ORAL_TABLET | Freq: Two times a day (BID) | ORAL | 0 refills | Status: DC

## 2017-11-06 MED ORDER — TRAZODONE HCL 50 MG PO TABS
100.0000 mg | ORAL_TABLET | Freq: Every evening | ORAL | Status: DC | PRN
  Administered 2017-11-06: 100 mg via ORAL
  Filled 2017-11-06: qty 2

## 2017-11-06 NOTE — Progress Notes (Addendum)
Was notified about this routine consult in the morning.  Patient was not seen as patient has been discharged when I arrived.

## 2017-11-06 NOTE — Discharge Summary (Addendum)
[place] [facility] [place] NAME: Jesse Thompson    MR#:  [id number]  DATE OF BIRTH:  02/06/1951  DATE OF ADMISSION:  11/04/2017 ADMITTING PHYSICIAN: Saundra Shelling, MD  DATE OF DISCHARGE: 11/06/2017  PRIMARY CARE PHYSICIAN: Tama High III, MD    ADMISSION DIAGNOSIS:  Right lower quadrant abdominal pain [R10.31] Sepsis, due to unspecified organism [A41.9] Fever, unspecified fever cause [R50.9]  DISCHARGE DIAGNOSIS:  Active Problems:   Sepsis ([place])   SECONDARY DIAGNOSIS:   Past Medical History:  Diagnosis Date  . Bursitis   . Collagen vascular disease ([place])   . Diabetes mellitus without complication ([place])   . GERD (gastroesophageal reflux disease)   . Gout   . HOH (hard of hearing)    Bilateral Hearing Aids  . Hyperlipidemia   . Hypertension   . Osteoarthritis   . Osteoarthritis   . 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  1.  Sepsis: Patient presented with fever and leukocytosis Etiology of sepsis is unclear as CT of the abdomen shows known sclerosing cholangitis and no acute pathology.  Chest x-ray shows no active disease.  Urinalysis was negative.  Blood cultures have been negative. ABC and lactic acid are improving.  Patient has been afebrile since admission.  He will be discharged empirically on Augmentin.  He was evaluated by GI while in the hospital for known sclerosing cholangitis.  2.  Diabetes: We will continue ADA diet outpatient oral medications.    3.  Essential hypertension: Continue Norvasc and bisoprolol At the time of discharge his blood pressure is low/normal and therefore Diovan/HCTZ has been discontinued for now.  Patient's blood pressure should be monitored as outpatient and if indicated this can be restarted.  4.   Hypothyroidism: Continue Synthroid  5.  Thrombocytopenia: Cytopenias due to sepsis.  Platelet count is slowly improving.  Patient needs repeat CBC on Tuesday or Wednesday by PCP.  6. AKI on CKD stage 3: creatinine iproved with IVF.  DISCHARGE CONDITIONS AND DIET:   Stable for discharge on cardiac diabetic diet  CONSULTS OBTAINED:  Treatment Team:  Jonathon Bellows, MD Earlie Server, MD  DRUG ALLERGIES:   Allergies  Allergen Reactions  . Metronidazole Itching  . Penicillin G Rash    Has patient had a PCN reaction causing immediate rash, facial/tongue/throat swelling, SOB or lightheadedness with hypotension: Unknown Has patient had a PCN reaction causing severe rash involving mucus membranes or skin necrosis: Unknown Has patient had a PCN reaction that required hospitalization: No Has patient had a PCN reaction occurring within the last 10 years: No If all of the above answers are "NO", then may proceed with Cephalosporin use.     DISCHARGE MEDICATIONS:   Allergies as of 11/06/2017      Reactions   Metronidazole Itching   Penicillin G Rash   Has patient had a PCN reaction causing immediate rash, facial/tongue/throat swelling, SOB or lightheadedness with hypotension: Unknown Has patient had a PCN reaction causing severe rash involving mucus membranes or skin necrosis: Unknown Has patient had a PCN reaction that required hospitalization: No Has patient had a PCN reaction occurring within the last 10 years: No If all of the above answers are "NO", then may proceed with Cephalosporin use.      Medication List  STOP taking these medications   HYDROcodone-acetaminophen 5-325 MG tablet Commonly known as:  NORCO/VICODIN   valsartan-hydrochlorothiazide 320-25 MG tablet Commonly known as:  DIOVAN-HCT     TAKE these medications   allopurinol 100 MG tablet Commonly known as:  ZYLOPRIM Take 100 mg by mouth daily.   amLODipine 10 MG tablet Commonly known as:  NORVASC Take 10 mg by  mouth daily.   amoxicillin-clavulanate 875-125 MG tablet Commonly known as:  AUGMENTIN Take 1 tablet by mouth 2 (two) times daily.   bisoprolol 5 MG tablet Commonly known as:  ZEBETA Take 2.5 mg by mouth daily.   diclofenac sodium 1 % Gel Commonly known as:  VOLTAREN Apply 2 g topically 3 (three) times daily as needed for pain.   famotidine 20 MG tablet Commonly known as:  PEPCID Take 20 mg by mouth every evening.   Fish Oil 1000 MG Caps Take 1,000 mg by mouth daily.   gabapentin 400 MG capsule Commonly known as:  NEURONTIN Take 1 capsule (400 mg total) by mouth 2 (two) times daily.   glipiZIDE 2.5 MG 24 hr tablet Commonly known as:  GLUCOTROL XL Take 2.5 mg by mouth daily.   levothyroxine 100 MCG tablet Commonly known as:  SYNTHROID, LEVOTHROID Take 100 mcg by mouth daily before breakfast.   meloxicam 15 MG tablet Commonly known as:  MOBIC Take 1 tablet (15 mg total) by mouth daily. What changed:  when to take this   metFORMIN 1000 MG tablet Commonly known as:  GLUCOPHAGE Take 1,000 mg by mouth 2 (two) times daily.   omeprazole 40 MG capsule Commonly known as:  PRILOSEC Take 40 mg by mouth daily before breakfast.   sildenafil 100 MG tablet Commonly known as:  VIAGRA Take 100 mg by mouth daily as needed for erectile dysfunction.   vitamin B-12 1000 MCG tablet Commonly known as:  CYANOCOBALAMIN Take 1,000 mcg by mouth daily.         Today   CHIEF COMPLAINT:  She is doing well this morning and ready for discharge home.   VITAL SIGNS:  Blood pressure (!) 107/59, pulse 62, temperature 100 F (37.8 C), temperature source Oral, resp. rate 19, height 5\' 6"  (1.676 m), weight 86.2 kg, SpO2 92 %.   REVIEW OF SYSTEMS:  Review of Systems  Constitutional: Negative.  Negative for chills, fever and malaise/fatigue.  HENT: Negative.  Negative for ear discharge, ear pain, hearing loss, nosebleeds and sore throat.   Eyes: Negative.  Negative for blurred vision  and pain.  Respiratory: Negative.  Negative for cough, hemoptysis, shortness of breath and wheezing.   Cardiovascular: Negative.  Negative for chest pain, palpitations and leg swelling.  Gastrointestinal: Negative.  Negative for abdominal pain, blood in stool, diarrhea, nausea and vomiting.  Genitourinary: Negative.  Negative for dysuria.  Musculoskeletal: Negative.  Negative for back pain.  Skin: Negative.   Neurological: Negative for dizziness, tremors, speech change, focal weakness, seizures and headaches.  Endo/Heme/Allergies: Negative.  Does not bruise/bleed easily.  Psychiatric/Behavioral: Negative.  Negative for depression, hallucinations and suicidal ideas.     PHYSICAL EXAMINATION:  GENERAL:  67 y.o.-year-old patient lying in the bed with no acute distress.  NECK:  Supple, no jugular venous distention. No thyroid enlargement, no tenderness.  LUNGS: Normal breath sounds bilaterally, no wheezing, rales,rhonchi  No use of accessory muscles of respiration.  CARDIOVASCULAR: S1, S2 normal. No murmurs, rubs, or gallops.  ABDOMEN: Soft, non-tender, non-distended. Bowel sounds present. No organomegaly or mass.  EXTREMITIES:  No pedal edema, cyanosis, or clubbing.  PSYCHIATRIC: The patient is alert and oriented x 3.  SKIN: No obvious rash, lesion, or ulcer.   DATA REVIEW:   CBC Recent Labs  Lab 11/05/17 0030  WBC 10.5  HGB 10.5*  HCT 31.4*  PLT 58*    Chemistries  Recent Labs  Lab 11/06/17 0321  NA 143  K 3.9  CL 113*  CO2 24  GLUCOSE 128*  BUN 31*  CREATININE 1.32*  CALCIUM 7.5*  AST 51*  ALT 51*  ALKPHOS 55  BILITOT 0.8    Cardiac Enzymes No results for input(s): TROPONINI in the last 168 hours.  Microbiology Results  @MICRORSLT48 @  RADIOLOGY:  No results found.    Allergies as of 11/06/2017      Reactions   Metronidazole Itching   Penicillin G Rash   Has patient had a PCN reaction causing immediate rash, facial/tongue/throat swelling, SOB or  lightheadedness with hypotension: Unknown Has patient had a PCN reaction causing severe rash involving mucus membranes or skin necrosis: Unknown Has patient had a PCN reaction that required hospitalization: No Has patient had a PCN reaction occurring within the last 10 years: No If all of the above answers are "NO", then may proceed with Cephalosporin use.      Medication List    STOP taking these medications   HYDROcodone-acetaminophen 5-325 MG tablet Commonly known as:  NORCO/VICODIN   valsartan-hydrochlorothiazide 320-25 MG tablet Commonly known as:  DIOVAN-HCT     TAKE these medications   allopurinol 100 MG tablet Commonly known as:  ZYLOPRIM Take 100 mg by mouth daily.   amLODipine 10 MG tablet Commonly known as:  NORVASC Take 10 mg by mouth daily.   amoxicillin-clavulanate 875-125 MG tablet Commonly known as:  AUGMENTIN Take 1 tablet by mouth 2 (two) times daily.   bisoprolol 5 MG tablet Commonly known as:  ZEBETA Take 2.5 mg by mouth daily.   diclofenac sodium 1 % Gel Commonly known as:  VOLTAREN Apply 2 g topically 3 (three) times daily as needed for pain.   famotidine 20 MG tablet Commonly known as:  PEPCID Take 20 mg by mouth every evening.   Fish Oil 1000 MG Caps Take 1,000 mg by mouth daily.   gabapentin 400 MG capsule Commonly known as:  NEURONTIN Take 1 capsule (400 mg total) by mouth 2 (two) times daily.   glipiZIDE 2.5 MG 24 hr tablet Commonly known as:  GLUCOTROL XL Take 2.5 mg by mouth daily.   levothyroxine 100 MCG tablet Commonly known as:  SYNTHROID, LEVOTHROID Take 100 mcg by mouth daily before breakfast.   meloxicam 15 MG tablet Commonly known as:  MOBIC Take 1 tablet (15 mg total) by mouth daily. What changed:  when to take this   metFORMIN 1000 MG tablet Commonly known as:  GLUCOPHAGE Take 1,000 mg by mouth 2 (two) times daily.   omeprazole 40 MG capsule Commonly known as:  PRILOSEC Take 40 mg by mouth daily before  breakfast.   sildenafil 100 MG tablet Commonly known as:  VIAGRA Take 100 mg by mouth daily as needed for erectile dysfunction.   vitamin B-12 1000 MCG tablet Commonly known as:  CYANOCOBALAMIN Take 1,000 mcg by mouth daily.          Management plans discussed with the patient and he is in agreement. Stable for discharge home  Patient should follow up with pcp  CODE STATUS:     Code Status Orders  (From admission,  onward)         Start     Ordered   11/04/17 1004  Full code  Continuous     11/04/17 1003        Code Status History    This patient has a current code status but no historical code status.    Advance Directive Documentation     Most Recent Value  Type of Advance Directive  Healthcare Power of Attorney, Living will  Pre-existing out of facility DNR order (yellow form or pink MOST form)  -  "MOST" Form in Place?  -      TOTAL TIME TAKING CARE OF THIS PATIENT: 38 minutes.    Note: This dictation was prepared with Dragon dictation along with smaller phrase technology. Any transcriptional errors that result from this process are unintentional.  Janani Chamber M.D on 11/06/2017 at 8:55 AM  Between 7am to 6pm - Pager - [phone] After 6pm go to [email or web address] - password [email or web address] [facility]  Office  [phone]  CC: Primary care physician; Adin Hector, MD

## 2017-11-07 ENCOUNTER — Ambulatory Visit
Admission: RE | Admit: 2017-11-07 | Discharge: 2017-11-07 | Disposition: A | Discharge status: Home / Self Care | Payer: Medicare HMO | Source: Ambulatory Visit | Attending: Family Medicine | Admitting: Family Medicine

## 2017-11-07 ENCOUNTER — Other Ambulatory Visit: Payer: Self-pay | Admitting: Family Medicine

## 2017-11-07 ENCOUNTER — Other Ambulatory Visit
Admission: RE | Admit: 2017-11-07 | Discharge: 2017-11-07 | Disposition: A | Discharge status: Home / Self Care | Payer: Medicare HMO | Source: Ambulatory Visit | Attending: Family Medicine | Admitting: Family Medicine

## 2017-11-07 DIAGNOSIS — D171 Benign lipomatous neoplasm of skin and subcutaneous tissue of trunk: Secondary | ICD-10-CM | POA: Insufficient documentation

## 2017-11-07 DIAGNOSIS — I10 Essential (primary) hypertension: Secondary | ICD-10-CM | POA: Diagnosis not present

## 2017-11-07 DIAGNOSIS — J90 Pleural effusion, not elsewhere classified: Secondary | ICD-10-CM | POA: Insufficient documentation

## 2017-11-07 DIAGNOSIS — K8301 Primary sclerosing cholangitis: Secondary | ICD-10-CM | POA: Diagnosis not present

## 2017-11-07 DIAGNOSIS — R0602 Shortness of breath: Secondary | ICD-10-CM | POA: Insufficient documentation

## 2017-11-07 DIAGNOSIS — R590 Localized enlarged lymph nodes: Secondary | ICD-10-CM | POA: Diagnosis not present

## 2017-11-07 DIAGNOSIS — R918 Other nonspecific abnormal finding of lung field: Secondary | ICD-10-CM | POA: Diagnosis not present

## 2017-11-07 DIAGNOSIS — E890 Postprocedural hypothyroidism: Secondary | ICD-10-CM | POA: Diagnosis not present

## 2017-11-07 DIAGNOSIS — R932 Abnormal findings on diagnostic imaging of liver and biliary tract: Secondary | ICD-10-CM | POA: Insufficient documentation

## 2017-11-07 DIAGNOSIS — D696 Thrombocytopenia, unspecified: Secondary | ICD-10-CM | POA: Diagnosis not present

## 2017-11-07 DIAGNOSIS — E119 Type 2 diabetes mellitus without complications: Secondary | ICD-10-CM | POA: Diagnosis not present

## 2017-11-07 DIAGNOSIS — J9811 Atelectasis: Secondary | ICD-10-CM | POA: Diagnosis not present

## 2017-11-07 DIAGNOSIS — Z09 Encounter for follow-up examination after completed treatment for conditions other than malignant neoplasm: Secondary | ICD-10-CM | POA: Diagnosis not present

## 2017-11-07 LAB — BRAIN NATRIURETIC PEPTIDE: B NATRIURETIC PEPTIDE 5: 705 pg/mL — AB (ref 0.0–100.0)

## 2017-11-07 MED ORDER — IOHEXOL 350 MG/ML SOLN
75.0000 mL | Freq: Once | INTRAVENOUS | Status: AC | PRN
  Administered 2017-11-07: 75 mL via INTRAVENOUS

## 2017-11-09 ENCOUNTER — Other Ambulatory Visit: Payer: Self-pay

## 2017-11-09 LAB — CULTURE, BLOOD (ROUTINE X 2)
Culture: NO GROWTH
Culture: NO GROWTH
Special Requests: ADEQUATE

## 2017-11-09 NOTE — Patient Outreach (Signed)
[facility]) Care Management  11/09/2017  Jesse Thompson Dec 10, 1950 [id number]  EMMI: General discharge red alert Referral date: 11/09/17 Referral reason: taking medications: no Insurance: [employer] Day # 1 Attempt #1   Telephone call to patient regarding referral. Unable to reach patient. HIPAA compliant voice message left with call back phone number.   PLAN: RNCM will attempt 2nd telephone call to patient within 4 business days. RNCM will send outreach letter.   Jesse Plowman RN,BSN, [place] Telephonic  [phone]

## 2017-11-10 ENCOUNTER — Other Ambulatory Visit: Payer: Self-pay

## 2017-11-10 NOTE — Patient Outreach (Signed)
[facility]) Care Management  11/10/2017  Jesse Thompson [birth year] [id number]  EMMI: General discharge red alert Referral date: 11/09/17 Referral reason: taking medications: no Insurance: [employer] Day # 1  Telephone call to patient regarding EMMI general discharge red alert referral. Unable to reach patient or leave voice message. Attempted call to listed home number.  Message states, " please enter remote access code.  Attempted call to listed mobile number. Message states, " the mailbox is full and cannot except messages at this time.   PLAN:  RNCM will attempt 3rd telephone call within 4 business days.   Quinn Plowman RN,BSN,CCM [facility] Telephonic  [phone]

## 2017-11-14 ENCOUNTER — Other Ambulatory Visit: Payer: Self-pay

## 2017-11-14 NOTE — Patient Outreach (Addendum)
[place] [facility]) Care Management  11/14/2017  Jesse Thompson 1950/10/11 [id number]  EMMI:General discharge red alert Referral date:11/09/17 Referral reason:taking medications: no Insurance: [employer] Day #1  Telephone call to patient regarding EMMI general discharge red alert. HIPAA verified. Patient gave permission to speak with his wife, Jesse Thompson regarding his health information. Both patient and wife on the phone with RNCM.  RNCM explained reason for call.  Patient states he is not having any problems with his medications. Patient reports he is taking his medications as prescribed. Patient states he is still have a little shortness of breath. Wife states patient was in the hospital due to sepsis. She states they informed the nurses as well as the doctor that patient was having increasing shortness of breath while he was in the hospital. Wife states a chest x ray was done but no CT of chest was done.  Patient states she is on home oxygen therapy. Wife states patient was so short of breath when he discharged to home that she allowed him to use her oxygen while she used her portable unit.   Wife states patient was discharged on 11/06/17 and he followed up with his primary MD on 11/07/17 due to the shortness of breath. Wife states patient saw both his primary MD and the pulmonologist the same day. Wife state a CT of the chest was done which showed fluid in both lungs and chest cavity. Wife states patient was told he had congestive heart failure.  Wife states he was started on lasix and lost 10 - 12 lbs within 4 days of being on the lasix. Wife states patient still has some shortness of breath now but is still continuing to drop fluid. Wife states patient is weighing everyday and recording. She states on their home scales patient started off weighing 213 lbs after being discharged from the hospital. Wife states patient's weight is currently 195.5 lbs as of today. Wife states patients  current oxygen saturation is in the 90's.  Wife states patient has a follow up visit with his primary MD on 11/18/17.  Wife states she and patient met with the director of nursing prior to patients discharge to discuss their concerns regarding patients care.  RNCM discussed,  Offered, and encouraged ongoing follow up with [facility] Care management.  Wife declined services. Wife states she has worked in the hospital for 30 plus years. Wife states she is knowledgeable of how to manage congestive heart failure. Wife states she knows patient is to be on a low salt diet, staying away from processed foods and eating more fruits and vegetables. Wife states she knows how to monitor patient for swelling and signs/ symptoms of heart failure.   RNCM offered to send [facility] brochure for future use. Wife verbally agreed.  RNCM advised patient to notify MD of any changes in condition prior to scheduled appointment. RNCM provided contact name and number: [phone] or main office number [phone] and 24 hour nurse advise line [phone].  RNCM verified patient aware of 911 services for urgent/ emergent needs.  ASSESSMENT: Transition of care will be completed by primary care provider office.  PLAN: RNCM will close patient due to refusal of services.  RNCM will send patient [facility] care management brochure / magnet RNCM will send patients primary MD closure notification   Quinn Plowman RN,BSN,CCM [facility] Telephonic  [phone]

## 2017-11-18 DIAGNOSIS — D649 Anemia, unspecified: Secondary | ICD-10-CM | POA: Diagnosis not present

## 2017-11-18 DIAGNOSIS — E890 Postprocedural hypothyroidism: Secondary | ICD-10-CM | POA: Diagnosis not present

## 2017-11-18 DIAGNOSIS — K8301 Primary sclerosing cholangitis: Secondary | ICD-10-CM | POA: Diagnosis not present

## 2017-11-18 DIAGNOSIS — E119 Type 2 diabetes mellitus without complications: Secondary | ICD-10-CM | POA: Diagnosis not present

## 2017-11-18 DIAGNOSIS — I10 Essential (primary) hypertension: Secondary | ICD-10-CM | POA: Diagnosis not present

## 2017-11-18 DIAGNOSIS — M109 Gout, unspecified: Secondary | ICD-10-CM | POA: Diagnosis not present

## 2017-11-18 DIAGNOSIS — J90 Pleural effusion, not elsewhere classified: Secondary | ICD-10-CM | POA: Diagnosis not present

## 2017-11-18 DIAGNOSIS — G4733 Obstructive sleep apnea (adult) (pediatric): Secondary | ICD-10-CM | POA: Diagnosis not present

## 2017-11-18 DIAGNOSIS — D696 Thrombocytopenia, unspecified: Secondary | ICD-10-CM | POA: Diagnosis not present

## 2017-11-28 DIAGNOSIS — J90 Pleural effusion, not elsewhere classified: Secondary | ICD-10-CM | POA: Diagnosis not present

## 2017-12-05 DIAGNOSIS — J90 Pleural effusion, not elsewhere classified: Secondary | ICD-10-CM | POA: Diagnosis not present

## 2017-12-05 DIAGNOSIS — I10 Essential (primary) hypertension: Secondary | ICD-10-CM | POA: Diagnosis not present

## 2017-12-19 DIAGNOSIS — Z23 Encounter for immunization: Secondary | ICD-10-CM | POA: Diagnosis not present

## 2017-12-19 DIAGNOSIS — J90 Pleural effusion, not elsewhere classified: Secondary | ICD-10-CM | POA: Diagnosis not present

## 2017-12-19 DIAGNOSIS — E119 Type 2 diabetes mellitus without complications: Secondary | ICD-10-CM | POA: Diagnosis not present

## 2017-12-19 DIAGNOSIS — E890 Postprocedural hypothyroidism: Secondary | ICD-10-CM | POA: Diagnosis not present

## 2017-12-19 DIAGNOSIS — M109 Gout, unspecified: Secondary | ICD-10-CM | POA: Diagnosis not present

## 2017-12-19 DIAGNOSIS — D649 Anemia, unspecified: Secondary | ICD-10-CM | POA: Diagnosis not present

## 2017-12-19 DIAGNOSIS — K8301 Primary sclerosing cholangitis: Secondary | ICD-10-CM | POA: Diagnosis not present

## 2017-12-19 DIAGNOSIS — I10 Essential (primary) hypertension: Secondary | ICD-10-CM | POA: Diagnosis not present

## 2017-12-19 DIAGNOSIS — G4733 Obstructive sleep apnea (adult) (pediatric): Secondary | ICD-10-CM | POA: Diagnosis not present

## 2018-02-07 DIAGNOSIS — R600 Localized edema: Secondary | ICD-10-CM | POA: Diagnosis not present

## 2018-02-07 DIAGNOSIS — R0602 Shortness of breath: Secondary | ICD-10-CM | POA: Diagnosis not present

## 2019-05-19 ENCOUNTER — Ambulatory Visit: Payer: Medicare Other | Attending: Internal Medicine

## 2019-05-19 DIAGNOSIS — Z23 Encounter for immunization: Secondary | ICD-10-CM

## 2019-05-19 NOTE — Progress Notes (Signed)
   Covid-19 Vaccination Clinic  Name:  Jesse Thompson    MRN: [id number] DOB: 05/21/1950  05/19/2019  Mr. Jesse Thompson was observed post Covid-19 immunization for 15 minutes without incident. He was provided with Vaccine Information Sheet and instruction to access the V-Safe system.   Mr. Jesse Thompson was instructed to call 911 with any severe reactions post vaccine: Marland Kitchen Difficulty breathing  . Swelling of face and throat  . A fast heartbeat  . A bad rash all over body  . Dizziness and weakness   Immunizations Administered    Name Date Dose VIS Date Route   Pfizer COVID-19 Vaccine 05/19/2019  9:56 AM 0.3 mL 01/19/2019 Intramuscular   Manufacturer: [place]   Lot: [id number]   [place]: [id number]

## 2019-06-01 ENCOUNTER — Other Ambulatory Visit: Payer: Self-pay | Admitting: Internal Medicine

## 2019-06-01 DIAGNOSIS — R911 Solitary pulmonary nodule: Secondary | ICD-10-CM

## 2019-06-13 ENCOUNTER — Ambulatory Visit: Payer: Medicare Other | Attending: Internal Medicine

## 2019-06-13 DIAGNOSIS — Z23 Encounter for immunization: Secondary | ICD-10-CM

## 2019-06-13 NOTE — Progress Notes (Signed)
   Covid-19 Vaccination Clinic  Name:  Jesse Thompson    MRN: [id number] DOB: 04-30-1950  06/13/2019  [name] was observed post Covid-19 immunization for 15 minutes without incident. He was provided with Vaccine Information Sheet and instruction to access the V-Safe system.   [name] was instructed to call 911 with any severe reactions post vaccine: Marland Kitchen Difficulty breathing  . Swelling of face and throat  . A fast heartbeat  . A bad rash all over body  . Dizziness and weakness   Immunizations Administered    Name Date Dose VIS Date Route   Pfizer COVID-19 Vaccine 06/13/2019  4:57 PM 0.3 mL 04/04/2018 Intramuscular   Manufacturer: [place]   Lot: [id number]   [place]: [id number]

## 2019-06-15 ENCOUNTER — Ambulatory Visit
Admission: RE | Admit: 2019-06-15 | Discharge: 2019-06-15 | Disposition: A | Discharge status: Home / Self Care | Payer: Medicare Other | Source: Ambulatory Visit | Attending: Internal Medicine | Admitting: Internal Medicine

## 2019-06-15 ENCOUNTER — Other Ambulatory Visit: Payer: Self-pay

## 2019-06-15 DIAGNOSIS — R911 Solitary pulmonary nodule: Secondary | ICD-10-CM | POA: Diagnosis present

## 2019-07-21 ENCOUNTER — Emergency Department: Payer: Medicare Other

## 2019-07-21 ENCOUNTER — Other Ambulatory Visit: Payer: Self-pay

## 2019-07-21 ENCOUNTER — Emergency Department
Admission: EM | Admit: 2019-07-21 | Discharge: 2019-07-21 | Disposition: A | Discharge status: Home / Self Care | Payer: Medicare Other | Attending: Emergency Medicine | Admitting: Emergency Medicine

## 2019-07-21 ENCOUNTER — Encounter: Payer: Self-pay | Admitting: Emergency Medicine

## 2019-07-21 DIAGNOSIS — Z79899 Other long term (current) drug therapy: Secondary | ICD-10-CM | POA: Diagnosis not present

## 2019-07-21 DIAGNOSIS — R410 Disorientation, unspecified: Secondary | ICD-10-CM | POA: Diagnosis not present

## 2019-07-21 DIAGNOSIS — E119 Type 2 diabetes mellitus without complications: Secondary | ICD-10-CM | POA: Insufficient documentation

## 2019-07-21 DIAGNOSIS — I10 Essential (primary) hypertension: Secondary | ICD-10-CM | POA: Diagnosis not present

## 2019-07-21 DIAGNOSIS — R200 Anesthesia of skin: Secondary | ICD-10-CM | POA: Diagnosis present

## 2019-07-21 DIAGNOSIS — Z7984 Long term (current) use of oral hypoglycemic drugs: Secondary | ICD-10-CM | POA: Insufficient documentation

## 2019-07-21 DIAGNOSIS — G459 Transient cerebral ischemic attack, unspecified: Secondary | ICD-10-CM | POA: Diagnosis not present

## 2019-07-21 LAB — COMPREHENSIVE METABOLIC PANEL
ALT: 50 U/L — ABNORMAL HIGH (ref 0–44)
AST: 66 U/L — ABNORMAL HIGH (ref 15–41)
Albumin: 4.2 g/dL (ref 3.5–5.0)
Alkaline Phosphatase: 102 U/L (ref 38–126)
Anion gap: 14 (ref 5–15)
BUN: 21 mg/dL (ref 8–23)
CO2: 23 mmol/L (ref 22–32)
Calcium: 9.3 mg/dL (ref 8.9–10.3)
Chloride: 103 mmol/L (ref 98–111)
Creatinine, Ser: 1.37 mg/dL — ABNORMAL HIGH (ref 0.61–1.24)
GFR calc Af Amer: >60 mL/min (ref >60)
GFR calc non Af Amer: 52 mL/min — ABNORMAL LOW (ref >60)
Glucose, Bld: 140 mg/dL — ABNORMAL HIGH (ref 70–99)
Potassium: 4.1 mmol/L (ref 3.5–5.1)
Sodium: 140 mmol/L (ref 135–145)
Total Bilirubin: 0.7 mg/dL (ref 0.3–1.2)
Total Protein: 7.2 g/dL (ref 6.5–8.1)

## 2019-07-21 LAB — DIFFERENTIAL
Abs Immature Granulocytes: 0.05 10*3/uL (ref 0.00–0.07)
Basophils Absolute: 0.1 10*3/uL (ref 0.0–0.1)
Basophils Relative: 1 %
Eosinophils Absolute: 0.1 10*3/uL (ref 0.0–0.5)
Eosinophils Relative: 2 %
Immature Granulocytes: 1 %
Lymphocytes Relative: 21 %
Lymphs Abs: 1.5 10*3/uL (ref 0.7–4.0)
Monocytes Absolute: 0.7 10*3/uL (ref 0.1–1.0)
Monocytes Relative: 10 %
Neutro Abs: 4.7 10*3/uL (ref 1.7–7.7)
Neutrophils Relative %: 65 %

## 2019-07-21 LAB — PROTIME-INR
INR: 1 (ref 0.8–1.2)
Prothrombin Time: 12.7 seconds (ref 11.4–15.2)

## 2019-07-21 LAB — CBC
HCT: 38.6 % — ABNORMAL LOW (ref 39.0–52.0)
Hemoglobin: 12.1 g/dL — ABNORMAL LOW (ref 13.0–17.0)
MCH: 25.4 pg — ABNORMAL LOW (ref 26.0–34.0)
MCHC: 31.3 g/dL (ref 30.0–36.0)
MCV: 80.9 fL (ref 80.0–100.0)
Platelets: 157 10*3/uL (ref 150–400)
RBC: 4.77 MIL/uL (ref 4.22–5.81)
RDW: 17 % — ABNORMAL HIGH (ref 11.5–15.5)
WBC: 7.2 10*3/uL (ref 4.0–10.5)
nRBC: 0 % (ref 0.0–0.2)

## 2019-07-21 LAB — APTT: aPTT: 28 seconds (ref 24–36)

## 2019-07-21 NOTE — ED Notes (Signed)
Pt visualized in NAD, sitting in chair on personal cell phone. Apologized and explained delay to patient.

## 2019-07-21 NOTE — ED Provider Notes (Signed)
[facility] Emergency Department Provider Note  ____________________________________________  Time seen: Approximately 1:10 PM  I have reviewed the triage vital signs and the nursing notes.   HISTORY  Chief Complaint Numbness    HPI Jesse Thompson is a 69 y.o. male who presents the emergency department for evaluation of right side numbness, confusion and slurred speech that occurred yesterday.  Patient states that he was at work, had sudden onset of rapidly progressing numbness and heaviness of the right side.  It started in his foot, rapidly progressed to include the entire right side.  Patient states that he sat down, was drinking some water.  Patient states that he did not feel like he was confused but it was reported that he was very confused at the time, was slurring his words.  EMS arrived to his job site but the patient refused transport.  Symptoms lasted roughly 15 to 20 minutes and then resolved.  He is completely asymptomatic at this time.  No history of CVA or TIA.  Patient with a history of collagen vascular disease, diabetes, GERD, hypertension, osteoarthritis.         Past Medical History:  Diagnosis Date  . Bursitis   . Collagen vascular disease ([place])   . Diabetes mellitus without complication ([place])   . GERD (gastroesophageal reflux disease)   . Gout   . HOH (hard of hearing)    Bilateral Hearing Aids  . Hyperlipidemia   . Hypertension   . Osteoarthritis   . Osteoarthritis   . 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 Vertigo     Patient Active Problem List   Diagnosis Date Noted  . Sepsis ([place]) 11/04/2017  . Osteoarthritis     Past Surgical History:  Procedure Laterality Date  . BILE DUCT STENT PLACEMENT  2015   now out  . CARPAL TUNNEL RELEASE Right 09/15/2016   Procedure: CARPAL TUNNEL RELEASE;  Surgeon: Earnestine Leys, MD;  Location: [facility] ORS;  Service: Orthopedics;  Laterality: Right;  . CARPAL TUNNEL RELEASE Left 09/29/2016    Procedure: CARPAL TUNNEL RELEASE;  Surgeon: Earnestine Leys, MD;  Location: [facility] ORS;  Service: Orthopedics;  Laterality: Left;  sutures removed right hand from status post carpal tunnel  . FRACTURE SURGERY Right    Ankle  . TOTAL THYROIDECTOMY      Prior to Admission medications   Medication Sig Start Date End Date Taking? Authorizing Provider  allopurinol (ZYLOPRIM) 100 MG tablet Take 100 mg by mouth daily. 07/16/16   [provider]  amLODipine (NORVASC) 10 MG tablet Take 10 mg by mouth daily. 09/12/17   [provider]  amoxicillin-clavulanate (AUGMENTIN) 875-125 MG tablet Take 1 tablet by mouth 2 (two) times daily. 11/06/17   Bettey Costa, MD  bisoprolol (ZEBETA) 5 MG tablet Take 2.5 mg by mouth daily. 09/12/17   [provider]  diclofenac sodium (VOLTAREN) 1 % GEL Apply 2 g topically 3 (three) times daily as needed for pain. 10/26/17   [provider]  famotidine (PEPCID) 20 MG tablet Take 20 mg by mouth every evening. 03/01/17   [provider]  gabapentin (NEURONTIN) 400 MG capsule Take 1 capsule (400 mg total) by mouth 2 (two) times daily. 09/15/16   Earnestine Leys, MD  glipiZIDE (GLUCOTROL XL) 2.5 MG 24 hr tablet Take 2.5 mg by mouth daily. 07/29/16   [provider]  levothyroxine (SYNTHROID, LEVOTHROID) 100 MCG tablet Take 100 mcg by mouth daily before breakfast. 07/16/16   [provider]  meloxicam (MOBIC) 15 MG tablet Take 1 tablet (15 mg total) by mouth daily. Patient taking differently: Take 15 mg by mouth every evening.  09/15/16   Earnestine Leys, MD  metFORMIN (GLUCOPHAGE) 1000 MG tablet Take 1,000 mg by mouth 2 (two) times daily. 07/16/16   [provider]  Omega-3 Fatty Acids (FISH OIL) 1000 MG CAPS Take 1,000 mg by mouth daily.    [provider]  omeprazole (PRILOSEC) 40 MG capsule Take 40 mg by mouth daily before breakfast.  06/05/14   [provider]  sildenafil (VIAGRA) 100 MG tablet Take 100 mg by  mouth daily as needed for erectile dysfunction.     [provider]  vitamin B-12 (CYANOCOBALAMIN) 1000 MCG tablet Take 1,000 mcg by mouth daily.    [provider]    Allergies Metronidazole and Penicillin g  Family History  Problem Relation Age of Onset  . Hypertension Mother   . Hypertension Father     Social History Social History   Tobacco Use  . Smoking status: Never Smoker  . Smokeless tobacco: Never Used  Vaping Use  . Vaping Use: Never used  Substance Use Topics  . Alcohol use: No    Alcohol/week: 0.0 standard drinks  . Drug use: No     Review of Systems  Constitutional: No fever/chills Eyes: No visual changes. No discharge ENT: No upper respiratory complaints. Cardiovascular: no chest pain. Respiratory: no cough. No SOB. Gastrointestinal: No abdominal pain.  No nausea, no vomiting.  No diarrhea.  No constipation. Musculoskeletal: Negative for musculoskeletal pain. Skin: Negative for rash, abrasions, lacerations, ecchymosis. Neurological: Patient is currently negative for headaches, focal weakness or numbness.  Yesterday patient experienced a 15 to 20-minute period of confusion, slurred speech and unilateral weakness and numbness. 10-point ROS otherwise negative.  ____________________________________________   PHYSICAL EXAM:  VITAL SIGNS: ED Triage Vitals  Enc Vitals Group     BP 07/21/19 1019 (!) 150/74     Pulse Rate 07/21/19 1019 63     Resp 07/21/19 1019 20     Temp 07/21/19 1019 98.4 F (36.9 C)     Temp Source 07/21/19 1019 Oral     SpO2 07/21/19 1019 98 %     Weight 07/21/19 1017 190 lb (86.2 kg)     Height 07/21/19 1017 5\' 6"  (1.676 m)     Head Circumference --      Peak Flow --      Pain Score 07/21/19 1017 0     Pain Loc --      Pain Edu? --      Excl. in [place]? --      Constitutional: Alert and oriented. Well appearing and in no acute distress. Eyes: Conjunctivae are normal. PERRL. EOMI. Head: Atraumatic. ENT:       Ears:       Nose: No congestion/rhinnorhea.      Mouth/Throat: Mucous membranes are moist.  Neck: No stridor.  No cervical spine tenderness to palpation.  Cardiovascular: Normal rate, regular rhythm. Normal S1 and S2.  Good peripheral circulation. Respiratory: Normal respiratory effort without tachypnea or retractions. Lungs CTAB. Good air entry to the bases with no decreased or absent breath sounds. Musculoskeletal: Full range of motion to all extremities. No gross deformities appreciated. Neurologic:  Normal speech and language. No gross focal neurologic deficits are appreciated.  Cranial nerves II through XII grossly intact.  Negative Romberg's and pronator drift.  Equal grip strength. Skin:  Skin is warm, dry and  intact. No rash noted. Psychiatric: Mood and affect are normal. Speech and behavior are normal. Patient exhibits appropriate insight and judgement.   ____________________________________________   LABS (all labs ordered are listed, but only abnormal results are displayed)  Labs Reviewed  CBC - Abnormal; Notable for the following components:      Result Value   Hemoglobin 12.1 (*)    HCT 38.6 (*)    MCH 25.4 (*)    RDW 17.0 (*)    All other components within normal limits  COMPREHENSIVE METABOLIC PANEL - Abnormal; Notable for the following components:   Glucose, Bld 140 (*)    Creatinine, Ser 1.37 (*)    AST 66 (*)    ALT 50 (*)    GFR calc non Af Amer 52 (*)    All other components within normal limits  PROTIME-INR  APTT  DIFFERENTIAL   ____________________________________________  EKG   ____________________________________________  RADIOLOGY I personally viewed and evaluated these images as part of my medical decision making, as well as reviewing the written report by the radiologist.  CT HEAD WO CONTRAST  Result Date: 07/21/2019 CLINICAL DATA:  Sudden onset right foot numbness yesterday. Symptoms then resolved. Possible stroke. EXAM: CT HEAD WITHOUT  CONTRAST TECHNIQUE: Contiguous axial images were obtained from the base of the skull through the vertex without intravenous contrast. COMPARISON:  None. FINDINGS: Brain: Ventricles, cisterns and other CSF spaces are normal. There is no mass, mass effect, shift of midline structures or acute hemorrhage. No evidence of acute infarction. There is moderate bilateral patchy white matter low attenuation likely chronic ischemic microvascular disease. Vascular: No hyperdense vessel or unexpected calcification. Skull: Normal. Negative for fracture or focal lesion. Sinuses/Orbits: No acute finding. Other: None. IMPRESSION: No acute findings. Moderate chronic ischemic microvascular disease. Electronically Signed   By: Marin Olp M.D.   On: 07/21/2019 10:57    ____________________________________________    PROCEDURES  Procedure(s) performed:    Procedures    Medications - No data to display   ____________________________________________   INITIAL IMPRESSION / ASSESSMENT AND PLAN / ED COURSE  Pertinent labs & imaging results that were available during my care of the patient were reviewed by me and considered in my medical decision making (see chart for details).  Review of the [place] CSRS was performed in accordance of the [place] prior to dispensing any controlled drugs.           Patient's diagnosis is consistent with TIA.  Patient presented to the emergency department after experiencing a 15 to 20-minute episode of slurred speech, confusion, weakness and numbness unilaterally to the right side of the body.  Patient was at a job site in [place].  Patient was initially evaluated by EMS, it was recommended that he go to the emergency department but patient declined.  Symptoms improved without treatment.  No history of same.  At this time patient's physical exam is reassuring with no acute neurologic deficits noted.  Imaging, labs are either at patient's baseline are reassuring.  No evidence of CVA  on CT.  Symptoms occurred roughly 24 hours ago, completely resolved at this time.  There is no indication for treatment.  At this time as patient is at his baseline, no residual symptoms, and 24 hours after onset I do not feel that MRI is warranted.  Patient will be referred to neurology for new TIA.Marland Kitchen  Patient is given ED precautions to return to the ED for any worsening or new symptoms.  ____________________________________________  FINAL CLINICAL IMPRESSION(S) / ED DIAGNOSES  Final diagnoses:  TIA (transient ischemic attack)      NEW MEDICATIONS STARTED DURING THIS VISIT:  ED Discharge Orders    None          This chart was dictated using voice recognition software/Dragon. Despite best efforts to proofread, errors can occur which can change the meaning. Any change was purely unintentional.    Darletta Moll, PA-C 07/21/19 1349    Harvest Dark, MD 07/21/19 1433

## 2019-07-21 NOTE — ED Provider Notes (Signed)
-----------------------------------------   2:03 PM on 07/21/2019 -----------------------------------------  I have seen and evaluated the patient in conjunction with physician assistant Betha Loa.  Patient appears well.  No acute distress at this time.  Patient has no complaints at this time.  Patient states little over 24 hours ago he had a tingling sensation in the right leg but that went up into the right abdomen and into the right arm and had some slight difficulty forming his words or had slowing of his words per patient.  This lasted approximately 5 to 10 minutes and then resolved.  Patient has had no symptoms since.  Currently patient appears well, reassuring work-up including lab work and CT scan.  Patient follows up with Dr. Kerrin Mo.  States he will follow-up on Monday with Dr. Caryl Comes for further work-up.  I discussed very strict return precautions with the patient as well as his wife who are agreeable to this plan of care.   Harvest Dark, MD 07/21/19 1404

## 2019-07-21 NOTE — ED Triage Notes (Signed)
Pt presents to ED via POV, pt ambulatory to triage with steady gait. Pt states yesterday was working in [place] and started having sudden onset numbness to R foot that went from R foot to top of R side of head. Pt states symptoms have resolved at this time.

## 2019-07-23 ENCOUNTER — Other Ambulatory Visit: Payer: Self-pay | Admitting: Family Medicine

## 2019-07-23 DIAGNOSIS — G459 Transient cerebral ischemic attack, unspecified: Secondary | ICD-10-CM

## 2019-08-05 ENCOUNTER — Ambulatory Visit
Admission: RE | Admit: 2019-08-05 | Discharge: 2019-08-05 | Disposition: A | Discharge status: Home / Self Care | Payer: Medicare Other | Source: Ambulatory Visit | Attending: Family Medicine | Admitting: Family Medicine

## 2019-08-05 ENCOUNTER — Other Ambulatory Visit: Payer: Self-pay

## 2019-08-05 DIAGNOSIS — G459 Transient cerebral ischemic attack, unspecified: Secondary | ICD-10-CM | POA: Diagnosis not present

## 2019-09-03 ENCOUNTER — Other Ambulatory Visit: Payer: Self-pay

## 2019-09-03 ENCOUNTER — Other Ambulatory Visit
Admission: RE | Admit: 2019-09-03 | Discharge: 2019-09-03 | Disposition: A | Discharge status: Home / Self Care | Payer: Medicare Other | Source: Ambulatory Visit | Attending: Internal Medicine | Admitting: Internal Medicine

## 2019-09-03 DIAGNOSIS — Z20822 Contact with and (suspected) exposure to covid-19: Secondary | ICD-10-CM | POA: Insufficient documentation

## 2019-09-03 DIAGNOSIS — Z01812 Encounter for preprocedural laboratory examination: Secondary | ICD-10-CM | POA: Insufficient documentation

## 2019-09-04 ENCOUNTER — Other Ambulatory Visit: Payer: Medicare Other

## 2019-09-04 LAB — SARS CORONAVIRUS 2 (TAT 6-24 HRS): SARS Coronavirus 2: NEGATIVE

## 2019-09-11 ENCOUNTER — Other Ambulatory Visit
Admission: RE | Admit: 2019-09-11 | Discharge: 2019-09-11 | Disposition: A | Discharge status: Home / Self Care | Payer: Medicare Other | Source: Ambulatory Visit | Attending: Internal Medicine | Admitting: Internal Medicine

## 2019-09-11 ENCOUNTER — Other Ambulatory Visit: Payer: Self-pay

## 2019-09-11 DIAGNOSIS — Z01812 Encounter for preprocedural laboratory examination: Secondary | ICD-10-CM | POA: Insufficient documentation

## 2019-09-11 DIAGNOSIS — Z20822 Contact with and (suspected) exposure to covid-19: Secondary | ICD-10-CM | POA: Insufficient documentation

## 2019-09-11 LAB — SARS CORONAVIRUS 2 (TAT 6-24 HRS): SARS Coronavirus 2: NEGATIVE

## 2019-09-13 ENCOUNTER — Other Ambulatory Visit: Payer: Self-pay

## 2019-09-13 ENCOUNTER — Encounter
Admission: RE | Disposition: A | Discharge status: Home / Self Care | Payer: Self-pay | Source: Ambulatory Visit | Attending: Internal Medicine

## 2019-09-13 ENCOUNTER — Encounter: Payer: Self-pay | Admitting: Internal Medicine

## 2019-09-13 ENCOUNTER — Observation Stay
Admission: RE | Admit: 2019-09-13 | Discharge: 2019-09-14 | Disposition: A | Discharge status: Home / Self Care | Payer: Medicare Other | Source: Ambulatory Visit | Attending: Internal Medicine | Admitting: Internal Medicine

## 2019-09-13 ENCOUNTER — Encounter: Payer: Self-pay | Admitting: Anesthesiology

## 2019-09-13 DIAGNOSIS — M1990 Unspecified osteoarthritis, unspecified site: Secondary | ICD-10-CM | POA: Insufficient documentation

## 2019-09-13 DIAGNOSIS — I272 Pulmonary hypertension, unspecified: Secondary | ICD-10-CM

## 2019-09-13 DIAGNOSIS — I214 Non-ST elevation (NSTEMI) myocardial infarction: Secondary | ICD-10-CM | POA: Diagnosis present

## 2019-09-13 DIAGNOSIS — Z7982 Long term (current) use of aspirin: Secondary | ICD-10-CM | POA: Insufficient documentation

## 2019-09-13 DIAGNOSIS — E119 Type 2 diabetes mellitus without complications: Secondary | ICD-10-CM | POA: Insufficient documentation

## 2019-09-13 DIAGNOSIS — Z7984 Long term (current) use of oral hypoglycemic drugs: Secondary | ICD-10-CM | POA: Diagnosis not present

## 2019-09-13 DIAGNOSIS — I2510 Atherosclerotic heart disease of native coronary artery without angina pectoris: Principal | ICD-10-CM | POA: Insufficient documentation

## 2019-09-13 DIAGNOSIS — Z8673 Personal history of transient ischemic attack (TIA), and cerebral infarction without residual deficits: Secondary | ICD-10-CM | POA: Insufficient documentation

## 2019-09-13 DIAGNOSIS — R0602 Shortness of breath: Secondary | ICD-10-CM | POA: Diagnosis present

## 2019-09-13 DIAGNOSIS — E785 Hyperlipidemia, unspecified: Secondary | ICD-10-CM | POA: Insufficient documentation

## 2019-09-13 DIAGNOSIS — Z79899 Other long term (current) drug therapy: Secondary | ICD-10-CM | POA: Insufficient documentation

## 2019-09-13 DIAGNOSIS — G4730 Sleep apnea, unspecified: Secondary | ICD-10-CM | POA: Insufficient documentation

## 2019-09-13 DIAGNOSIS — Z955 Presence of coronary angioplasty implant and graft: Secondary | ICD-10-CM

## 2019-09-13 HISTORY — PX: CORONARY STENT INTERVENTION: CATH118234

## 2019-09-13 HISTORY — PX: RIGHT/LEFT HEART CATH AND CORONARY ANGIOGRAPHY: CATH118266

## 2019-09-13 HISTORY — DX: Non-ST elevation (NSTEMI) myocardial infarction: I21.4

## 2019-09-13 LAB — GLUCOSE, CAPILLARY
Glucose-Capillary: 123 mg/dL — ABNORMAL HIGH (ref 70–99)
Glucose-Capillary: 146 mg/dL — ABNORMAL HIGH (ref 70–99)
Glucose-Capillary: 147 mg/dL — ABNORMAL HIGH (ref 70–99)

## 2019-09-13 LAB — POCT ACTIVATED CLOTTING TIME: Activated Clotting Time: 356 seconds

## 2019-09-13 SURGERY — RIGHT/LEFT HEART CATH AND CORONARY ANGIOGRAPHY
Anesthesia: Moderate Sedation

## 2019-09-13 MED ORDER — TICAGRELOR 90 MG PO TABS
ORAL_TABLET | ORAL | Status: AC
  Filled 2019-09-13: qty 2

## 2019-09-13 MED ORDER — HEPARIN (PORCINE) IN NACL 1000-0.9 UT/500ML-% IV SOLN
INTRAVENOUS | Status: AC
  Filled 2019-09-13: qty 1000

## 2019-09-13 MED ORDER — FENTANYL CITRATE (PF) 100 MCG/2ML IJ SOLN
INTRAMUSCULAR | Status: AC
  Filled 2019-09-13: qty 2

## 2019-09-13 MED ORDER — SODIUM CHLORIDE 0.9 % IV SOLN: 250.0000 mL | INTRAVENOUS | Status: DC | PRN

## 2019-09-13 MED ORDER — SODIUM CHLORIDE 0.9 % IV BOLUS
INTRAVENOUS | Status: AC | PRN
  Administered 2019-09-13: 265 mL via INTRAVENOUS

## 2019-09-13 MED ORDER — LIDOCAINE HCL (PF) 1 % IJ SOLN
INTRAMUSCULAR | Status: AC
  Filled 2019-09-13: qty 30

## 2019-09-13 MED ORDER — ASPIRIN 81 MG PO CHEW: 81.0000 mg | CHEWABLE_TABLET | ORAL | Status: AC

## 2019-09-13 MED ORDER — MIDAZOLAM HCL 2 MG/2ML IJ SOLN
INTRAMUSCULAR | Status: AC
  Filled 2019-09-13: qty 2

## 2019-09-13 MED ORDER — LABETALOL HCL 5 MG/ML IV SOLN: 10.0000 mg | INTRAVENOUS | Status: AC | PRN

## 2019-09-13 MED ORDER — ASPIRIN 81 MG PO CHEW
CHEWABLE_TABLET | ORAL | Status: AC
  Administered 2019-09-13: 81 mg via ORAL
  Filled 2019-09-13: qty 1

## 2019-09-13 MED ORDER — FENTANYL CITRATE (PF) 100 MCG/2ML IJ SOLN
INTRAMUSCULAR | Status: DC | PRN
  Administered 2019-09-13: 25 ug via INTRAVENOUS

## 2019-09-13 MED ORDER — SODIUM CHLORIDE 0.9% FLUSH: 3.0000 mL | INTRAVENOUS | Status: DC | PRN

## 2019-09-13 MED ORDER — ASPIRIN 81 MG PO CHEW
81.0000 mg | CHEWABLE_TABLET | Freq: Every day | ORAL | Status: DC
  Administered 2019-09-14: 81 mg via ORAL
  Filled 2019-09-13: qty 1

## 2019-09-13 MED ORDER — ASPIRIN 81 MG PO CHEW
CHEWABLE_TABLET | ORAL | Status: AC
  Filled 2019-09-13: qty 3

## 2019-09-13 MED ORDER — IOHEXOL 300 MG/ML  SOLN
INTRAMUSCULAR | Status: DC | PRN
  Administered 2019-09-13: 490 mL

## 2019-09-13 MED ORDER — TICAGRELOR 90 MG PO TABS: 90.0000 mg | ORAL_TABLET | Freq: Two times a day (BID) | ORAL | Status: DC

## 2019-09-13 MED ORDER — BIVALIRUDIN TRIFLUOROACETATE 250 MG IV SOLR
INTRAVENOUS | Status: AC
  Filled 2019-09-13: qty 250

## 2019-09-13 MED ORDER — SODIUM CHLORIDE 0.9 % IV SOLN
0.2500 mg/kg/h | INTRAVENOUS | Status: AC
  Filled 2019-09-13: qty 250

## 2019-09-13 MED ORDER — HYDRALAZINE HCL 20 MG/ML IJ SOLN: 10.0000 mg | INTRAMUSCULAR | Status: AC | PRN

## 2019-09-13 MED ORDER — SODIUM CHLORIDE 0.9 % IV SOLN
INTRAVENOUS | Status: AC | PRN
  Administered 2019-09-13: 1.75 mg/kg/h via INTRAVENOUS

## 2019-09-13 MED ORDER — ACETAMINOPHEN 325 MG PO TABS: 650.0000 mg | ORAL_TABLET | ORAL | Status: DC | PRN

## 2019-09-13 MED ORDER — SODIUM CHLORIDE 0.9 % WEIGHT BASED INFUSION
1.0000 mL/kg/h | INTRAVENOUS | Status: DC
  Administered 2019-09-13: 1 mL/kg/h via INTRAVENOUS

## 2019-09-13 MED ORDER — SODIUM CHLORIDE 0.9% FLUSH
3.0000 mL | Freq: Two times a day (BID) | INTRAVENOUS | Status: DC
  Administered 2019-09-13 (×2): 3 mL via INTRAVENOUS

## 2019-09-13 MED ORDER — ASPIRIN 81 MG PO CHEW
CHEWABLE_TABLET | ORAL | Status: DC | PRN
  Administered 2019-09-13: 243 mg via ORAL

## 2019-09-13 MED ORDER — HEPARIN (PORCINE) IN NACL 1000-0.9 UT/500ML-% IV SOLN
INTRAVENOUS | Status: DC | PRN
  Administered 2019-09-13 (×2): 500 mL

## 2019-09-13 MED ORDER — MIDAZOLAM HCL 2 MG/2ML IJ SOLN
INTRAMUSCULAR | Status: DC | PRN
  Administered 2019-09-13: 1 mg via INTRAVENOUS

## 2019-09-13 MED ORDER — ONDANSETRON HCL 4 MG/2ML IJ SOLN
4.0000 mg | Freq: Four times a day (QID) | INTRAMUSCULAR | Status: DC | PRN
  Administered 2019-09-13: 4 mg via INTRAVENOUS

## 2019-09-13 MED ORDER — SODIUM CHLORIDE 0.9% FLUSH
3.0000 mL | Freq: Two times a day (BID) | INTRAVENOUS | Status: DC
  Administered 2019-09-14: 3 mL via INTRAVENOUS

## 2019-09-13 MED ORDER — SODIUM CHLORIDE 0.9 % WEIGHT BASED INFUSION
3.0000 mL/kg/h | INTRAVENOUS | Status: AC
  Administered 2019-09-13: 3 mL/kg/h via INTRAVENOUS

## 2019-09-13 MED ORDER — ONDANSETRON HCL 4 MG/2ML IJ SOLN
INTRAMUSCULAR | Status: AC
  Filled 2019-09-13: qty 2

## 2019-09-13 MED ORDER — SODIUM CHLORIDE 0.9 % WEIGHT BASED INFUSION: 1.0000 mL/kg/h | INTRAVENOUS | Status: AC

## 2019-09-13 MED ORDER — BIVALIRUDIN BOLUS VIA INFUSION - CUPID
INTRAVENOUS | Status: DC | PRN
  Administered 2019-09-13: 66 mg via INTRAVENOUS

## 2019-09-13 MED ORDER — TICAGRELOR 90 MG PO TABS
ORAL_TABLET | ORAL | Status: DC | PRN
  Administered 2019-09-13: 180 mg via ORAL

## 2019-09-13 SURGICAL SUPPLY — 22 items
BALLN TREK RX 2.5X15 (BALLOONS) ×3
BALLOON TREK RX 2.5X15 (BALLOONS) ×1 IMPLANT
CATH INFINITI 5FR JL4 (CATHETERS) ×3 IMPLANT
CATH INFINITI JR4 5F (CATHETERS) ×3 IMPLANT
CATH SWANZ 7F THERMO (CATHETERS) ×3 IMPLANT
CATH VISTA GUIDE 6FR XB3.5 (CATHETERS) ×3 IMPLANT
CATH VISTA GUIDE 6FRXBLAD3.5SH (CATHETERS) ×3 IMPLANT
DEVICE CLOSURE MYNXGRIP 6/7F (Vascular Products) ×3 IMPLANT
DEVICE INFLAT 30 PLUS (MISCELLANEOUS) ×3 IMPLANT
DEVICE SAFEGUARD 24CM (GAUZE/BANDAGES/DRESSINGS) ×3 IMPLANT
GUIDEWIRE EMER 3M J .025X150CM (WIRE) ×3 IMPLANT
KIT MANI 3VAL PERCEP (MISCELLANEOUS) ×3 IMPLANT
KIT RIGHT HEART (MISCELLANEOUS) ×3 IMPLANT
NEEDLE PERC 18GX7CM (NEEDLE) ×3 IMPLANT
PACK CARDIAC CATH (CUSTOM PROCEDURE TRAY) ×3 IMPLANT
SHEATH AVANTI 5FR X 11CM (SHEATH) ×3 IMPLANT
SHEATH AVANTI 6FR X 11CM (SHEATH) ×3 IMPLANT
SHEATH AVANTI 7FRX11 (SHEATH) ×3 IMPLANT
STENT RESOLUTE ONYX 2.5X15 (Permanent Stent) ×6 IMPLANT
STENT RESOLUTE ONYX 2.5X22 (Permanent Stent) ×3 IMPLANT
WIRE G HI TQ BMW 190 (WIRE) ×3 IMPLANT
WIRE GUIDERIGHT .035X150 (WIRE) ×3 IMPLANT

## 2019-09-13 NOTE — TOC Initial Note (Signed)
Transition of Care [facility]) - Initial/Assessment Note    Patient Details  Name: Jesse Thompson MRN: [id number] Date of Birth: 12/16/1950  [facility]) CM/SW Contact:    Anselm Pancoast, RN Phone Number: 09/13/2019, 4:34 PM  Clinical Narrative:                 Met with patient and wife at bedside. Patient was having some complications preventing him from signing Code 44. RN CM provided information to patient and his wife and provided written copy of Code 75. Answered all questions.         Patient Goals and CMS Choice        Expected Discharge Plan and Services                                                Prior Living Arrangements/Services                       Activities of Daily Living Home Assistive Devices/Equipment: Blood pressure cuff, CBG Meter ADL Screening (condition at time of admission) Patient's cognitive ability adequate to safely complete daily activities?: Yes Is the patient deaf or have difficulty hearing?: Yes Does the patient have difficulty seeing, even when wearing glasses/contacts?: No Does the patient have difficulty concentrating, remembering, or making decisions?: No Patient able to express need for assistance with ADLs?: Yes Does the patient have difficulty dressing or bathing?: No Independently performs ADLs?: Yes (appropriate for developmental age) Does the patient have difficulty walking or climbing stairs?: No Weakness of Legs: None Weakness of Arms/Hands: None  Permission Sought/Granted                  Emotional Assessment              Admission diagnosis:  Non-STEMI (non-ST elevated myocardial infarction) ([place]) [I21.4] S/P drug eluting coronary stent placement [Z95.5] Patient Active Problem List   Diagnosis Date Noted  . Non-STEMI (non-ST elevated myocardial infarction) ([place]) 09/13/2019  . S/P drug eluting coronary stent placement 09/13/2019  . Sepsis ([place]) 11/04/2017  . Osteoarthritis     PCP:  Adin Hector, MD Pharmacy:   [facility] - [place], [place] [address] Phone: [phone] Fax: [phone]  [id number] [place] [id number]: [phone]: [phone]    Social Determinants of Health (SDOH) Interventions    Readmission Risk Interventions No flowsheet data found.

## 2019-09-13 NOTE — Progress Notes (Signed)
Provided handoff and site assessed with Darlene,RN. Chart information provided at nurse's desk.

## 2019-09-13 NOTE — Care Management CC44 (Signed)
Condition Code 44 Documentation Completed  Patient Details  Name: CORNEILUS HEGGIE MRN: [id number] Date of Birth: [birth year]   Condition Code 44 given:  Yes Patient signature on Condition Code 44 notice:  Yes Documentation of 2 MD's agreement:  Yes Code 44 added to claim:  Yes    Anselm Pancoast, RN 09/13/2019, 4:22 PM

## 2019-09-13 NOTE — Care Management Obs Status (Signed)
[facility] NOTIFICATION   Patient Details  Name: Jesse Thompson MRN: [id number] Date of Birth: 01-02-1951   Medicare Observation Status Notification Given:       Anselm Pancoast, RN 09/13/2019, 4:21 PM

## 2019-09-13 NOTE — Plan of Care (Signed)
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  Oriented to safety features of the room.  Wife at bedside.   Problem: Education: Goal: Knowledge of General Education information will improve Description: Including pain rating scale, medication(s)/side effects and non-pharmacologic comfort measures Outcome: Progressing   Problem: Health Behavior/Discharge Planning: Goal: Ability to manage health-related needs will improve Outcome: Progressing   Problem: Clinical Measurements: Goal: Ability to maintain clinical measurements within normal limits will improve Outcome: Progressing Goal: Will remain free from infection Outcome: Progressing Goal: Diagnostic test results will improve Outcome: Progressing Goal: Respiratory complications will improve Outcome: Progressing Goal: Cardiovascular complication will be avoided Outcome: Progressing   Problem: Activity: Goal: Risk for activity intolerance will decrease Outcome: Progressing   Problem: Nutrition: Goal: Adequate nutrition will be maintained Outcome: Progressing   Problem: Coping: Goal: Level of anxiety will decrease Outcome: Progressing   Problem: Elimination: Goal: Will not experience complications related to bowel motility Outcome: Progressing Goal: Will not experience complications related to urinary retention Outcome: Progressing   Problem: Pain Managment: Goal: General experience of comfort will improve Outcome: Progressing   Problem: Safety: Goal: Ability to remain free from injury will improve Outcome: Progressing   Problem: Skin Integrity: Goal: Risk for impaired skin integrity will decrease Outcome: Progressing   Problem: Education: Goal: Understanding of CV disease, CV risk reduction, and recovery process will improve Outcome: Progressing Goal: Individualized Educational Video(s) Outcome: Progressing   Problem: Activity: Goal: Ability to return to  baseline activity level will improve Outcome: Progressing   Problem: Cardiovascular: Goal: Ability to achieve and maintain adequate cardiovascular perfusion will improve Outcome: Progressing Goal: Vascular access site(s) Level 0-1 will be maintained Outcome: Progressing   Problem: Health Behavior/Discharge Planning: Goal: Ability to safely manage health-related needs after discharge will improve Outcome: Progressing

## 2019-09-13 NOTE — Care Management CC44 (Signed)
Condition Code 44 Documentation Completed  Patient Details  Name: JOURDIN CONNORS MRN: [id number] Date of Birth: [birth year]   Condition Code 44 given:  Yes Patient signature on Condition Code 44 notice:  Yes Documentation of 2 MD's agreement:  Yes Code 44 added to claim:  Yes    Anselm Pancoast, RN 09/13/2019, 4:22 PM

## 2019-09-14 DIAGNOSIS — I2510 Atherosclerotic heart disease of native coronary artery without angina pectoris: Secondary | ICD-10-CM | POA: Diagnosis not present

## 2019-09-14 DIAGNOSIS — Z955 Presence of coronary angioplasty implant and graft: Secondary | ICD-10-CM

## 2019-09-14 DIAGNOSIS — R000 Tachycardia, unspecified: Secondary | ICD-10-CM

## 2019-09-14 MED ORDER — SODIUM CHLORIDE 0.9 % IV SOLN: 250.0000 mL | INTRAVENOUS | Status: DC | PRN

## 2019-09-14 MED ORDER — ASPIRIN 81 MG PO CHEW: 81.0000 mg | CHEWABLE_TABLET | ORAL | Status: DC

## 2019-09-14 MED ORDER — SODIUM CHLORIDE 0.9% FLUSH: 3.0000 mL | Freq: Two times a day (BID) | INTRAVENOUS | Status: DC

## 2019-09-14 MED ORDER — ONDANSETRON HCL 4 MG/2ML IJ SOLN: 4.0000 mg | Freq: Four times a day (QID) | INTRAMUSCULAR | Status: DC | PRN

## 2019-09-14 MED ORDER — SODIUM CHLORIDE 0.9 % WEIGHT BASED INFUSION: 1.0000 mL/kg/h | INTRAVENOUS | Status: DC

## 2019-09-14 MED ORDER — TICAGRELOR 90 MG PO TABS
90.0000 mg | ORAL_TABLET | Freq: Two times a day (BID) | ORAL | Status: DC
  Administered 2019-09-14: 90 mg via ORAL
  Filled 2019-09-14: qty 1

## 2019-09-14 MED ORDER — SODIUM CHLORIDE 0.9% FLUSH: 3.0000 mL | INTRAVENOUS | Status: DC | PRN

## 2019-09-14 MED ORDER — SODIUM CHLORIDE 0.9 % WEIGHT BASED INFUSION: 3.0000 mL/kg/h | INTRAVENOUS | Status: AC

## 2019-09-14 MED ORDER — TICAGRELOR 90 MG PO TABS: 90.0000 mg | ORAL_TABLET | Freq: Two times a day (BID) | ORAL | 12 refills | Status: DC

## 2019-09-14 MED ORDER — ACETAMINOPHEN 325 MG PO TABS: 650.0000 mg | ORAL_TABLET | ORAL | Status: DC | PRN

## 2019-09-14 MED ORDER — SODIUM CHLORIDE 0.9 % WEIGHT BASED INFUSION: 1.0000 mL/kg/h | INTRAVENOUS | Status: AC

## 2019-09-14 MED ORDER — HYDRALAZINE HCL 20 MG/ML IJ SOLN: 10.0000 mg | INTRAMUSCULAR | Status: AC | PRN

## 2019-09-14 MED ORDER — ASPIRIN 81 MG PO CHEW: 81.0000 mg | CHEWABLE_TABLET | Freq: Every day | ORAL | Status: DC

## 2019-09-14 MED ORDER — LABETALOL HCL 5 MG/ML IV SOLN: 10.0000 mg | INTRAVENOUS | Status: AC | PRN

## 2019-09-14 NOTE — Discharge Summary (Signed)
Physician Discharge Summary      Patient ID: Jesse Thompson MRN: [id number] DOB/AGE: [birth year] 69 y.o.  Admit date: 09/13/2019 Discharge date: 09/14/2019  Primary Discharge Diagnosis coronary artery disease shortness of breath Secondary Discharge Diagnosis PCI and stent to multivessel coronary disease with DES  Significant Diagnostic Studies: angiography: Cardiac cath multivessel coronary disease LAD circumflex   Consults: None  Hospital Course: Patient was brought in as an outpatient for right and left heart catheter possible PCI and stent.  Patient has normal overall left ventricular function but was found to have serial 75% lesion proximal and mid LAD small occluded diagonal 1 patient also had 95 urine OM1 which is a large vessel right coronary artery was essentially free of disease.  We proceeded with a right heart cath which showed no significant pulmonary pretension.  Intervention was performed on proximal and mid LAD both 75% lesions reduced down to 0.  Circumflex OM lesion was also intervened with DES stent reducing the lesion down to 0 from 95%.  Patient was placed on Brilinta and aspirin and maintained on his previous medications ready for discharge without any complications   Discharge Exam: Blood pressure (!) 151/73, pulse (!) 54, temperature 98.6 F (70 C), temperature source Oral, resp. rate 16, height 5\' 7"  (1.702 m), weight 91.4 kg, SpO2 99 %.   General appearance: alert and appears stated age  69 normocephalic atraumatic pupils equal reactive to light Neck exam supple no significant JVD bruits adenopathy Lung exam clear to auscultation percussion Heart exam regular rate rhythm systolic ejection murmur 2/6 SEM Abdominal exam benign  Extremities are within normal limits Right groin no significant abnormality no hematoma minor bruising no bleeding Neurologic exam grossly intact  Labs:   Lab Results  Component Value Date   WBC 7.2 07/21/2019   HGB 12.1 (L)  07/21/2019   HCT 38.6 (L) 07/21/2019   MCV 80.9 07/21/2019   PLT 157 07/21/2019   No results for input(s): NA, K, CL, CO2, BUN, CREATININE, CALCIUM, PROT, BILITOT, ALKPHOS, ALT, AST, GLUCOSE in the last 168 hours.  Invalid input(s): LABALBU    Radiology: none EKG: Normal sinus rhythm nonspecific ST-T wave changes  FOLLOW UP PLANS AND APPOINTMENTS Discharge Instructions    AMB Referral to Cardiac Rehabilitation - Phase II   Complete by: As directed    Diagnosis: NSTEMI   After initial evaluation and assessments completed: Virtual Based Care may be provided alone or in conjunction with Phase 2 Cardiac Rehab based on patient barriers.: Yes   AMB Referral to Cardiac Rehabilitation - Phase II   Complete by: As directed    Diagnosis: Coronary Stents   After initial evaluation and assessments completed: Virtual Based Care may be provided alone or in conjunction with Phase 2 Cardiac Rehab based on patient barriers.: Yes     Allergies as of 09/14/2019      Reactions   Metronidazole Itching   Penicillin G Rash   Has patient had a PCN reaction causing immediate rash, facial/tongue/throat swelling, SOB or lightheadedness with hypotension: Unknown Has patient had a PCN reaction causing severe rash involving mucus membranes or skin necrosis: Unknown Has patient had a PCN reaction that required hospitalization: No Has patient had a PCN reaction occurring within the last 10 years: No If all of the above answers are "NO", then may proceed with Cephalosporin use.      Medication List    TAKE these medications   acetaminophen 500 MG tablet Commonly known as: TYLENOL  Take 500 mg by mouth every 6 (six) hours as needed.   allopurinol 100 MG tablet Commonly known as: ZYLOPRIM Take 100 mg by mouth daily.   amLODipine 10 MG tablet Commonly known as: NORVASC Take 10 mg by mouth daily.   aspirin EC 81 MG tablet Take 81 mg by mouth daily. Swallow whole.   bisoprolol 5 MG tablet Commonly known  as: ZEBETA Take 2.5 mg by mouth daily.   cholecalciferol 25 MCG (1000 UNIT) tablet Commonly known as: VITAMIN D3 Take 1,000 Units by mouth daily.   diclofenac sodium 1 % Gel Commonly known as: VOLTAREN Apply 2 g topically 3 (three) times daily as needed for pain.   famotidine 20 MG tablet Commonly known as: PEPCID Take 20 mg by mouth every evening.   Fish Oil 1000 MG Caps Take 1,000 mg by mouth daily.   furosemide 20 MG tablet Commonly known as: LASIX Take 20 mg by mouth 2 (two) times daily.   gabapentin 400 MG capsule Commonly known as: Neurontin Take 1 capsule (400 mg total) by mouth 2 (two) times daily.   glipiZIDE 2.5 MG 24 hr tablet Commonly known as: GLUCOTROL XL Take 2.5 mg by mouth daily.   hydrALAZINE 100 MG tablet Commonly known as: APRESOLINE Take 100 mg by mouth 3 (three) times daily.   levothyroxine 100 MCG tablet Commonly known as: SYNTHROID Take 100 mcg by mouth daily before breakfast.   meloxicam 15 MG tablet Commonly known as: MOBIC Take 1 tablet (15 mg total) by mouth daily. What changed: when to take this   metFORMIN 1000 MG tablet Commonly known as: GLUCOPHAGE Take 1,000 mg by mouth 2 (two) times daily.   omeprazole 40 MG capsule Commonly known as: PRILOSEC Take 40 mg by mouth daily before breakfast.   rosuvastatin 5 MG tablet Commonly known as: CRESTOR Take 5 mg by mouth daily.   ticagrelor 90 MG Tabs tablet Commonly known as: BRILINTA Take 1 tablet (90 mg total) by mouth 2 (two) times daily.   valsartan 80 MG tablet Commonly known as: DIOVAN Take 80 mg by mouth daily.   vitamin B-12 1000 MCG tablet Commonly known as: CYANOCOBALAMIN Take 1,000 mcg by mouth daily.        BRING ALL MEDICATIONS WITH YOU TO FOLLOW UP APPOINTMENTS  Time spent with patient to include physician time: 30 minutes Signed:  Yolonda Kida MD 09/14/2019, 8:41 AM

## 2019-09-14 NOTE — Plan of Care (Signed)
Discharge instructions provided to pt.  All questions addressed.  Understanding verified through teach back.  Awaiting transportation home via POV.   Problem: Education: Goal: Knowledge of General Education information will improve Description: Including pain rating scale, medication(s)/side effects and non-pharmacologic comfort measures Outcome: Adequate for Discharge   Problem: Health Behavior/Discharge Planning: Goal: Ability to manage health-related needs will improve Outcome: Adequate for Discharge   Problem: Clinical Measurements: Goal: Ability to maintain clinical measurements within normal limits will improve Outcome: Adequate for Discharge Goal: Will remain free from infection Outcome: Adequate for Discharge Goal: Diagnostic test results will improve Outcome: Adequate for Discharge Goal: Respiratory complications will improve Outcome: Adequate for Discharge Goal: Cardiovascular complication will be avoided Outcome: Adequate for Discharge   Problem: Activity: Goal: Risk for activity intolerance will decrease Outcome: Adequate for Discharge   Problem: Nutrition: Goal: Adequate nutrition will be maintained Outcome: Adequate for Discharge   Problem: Coping: Goal: Level of anxiety will decrease Outcome: Adequate for Discharge   Problem: Elimination: Goal: Will not experience complications related to bowel motility Outcome: Adequate for Discharge Goal: Will not experience complications related to urinary retention Outcome: Adequate for Discharge   Problem: Pain Managment: Goal: General experience of comfort will improve Outcome: Adequate for Discharge   Problem: Safety: Goal: Ability to remain free from injury will improve Outcome: Adequate for Discharge   Problem: Skin Integrity: Goal: Risk for impaired skin integrity will decrease Outcome: Adequate for Discharge   Problem: Education: Goal: Understanding of CV disease, CV risk reduction, and recovery process  will improve Outcome: Adequate for Discharge Goal: Individualized Educational Video(s) Outcome: Adequate for Discharge   Problem: Activity: Goal: Ability to return to baseline activity level will improve Outcome: Adequate for Discharge   Problem: Cardiovascular: Goal: Ability to achieve and maintain adequate cardiovascular perfusion will improve Outcome: Adequate for Discharge Goal: Vascular access site(s) Level 0-1 will be maintained Outcome: Adequate for Discharge   Problem: Health Behavior/Discharge Planning: Goal: Ability to safely manage health-related needs after discharge will improve Outcome: Adequate for Discharge

## 2019-09-17 ENCOUNTER — Encounter: Payer: Self-pay | Admitting: Internal Medicine

## 2020-02-09 DIAGNOSIS — S93401A Sprain of unspecified ligament of right ankle, initial encounter: Secondary | ICD-10-CM | POA: Diagnosis not present

## 2020-02-17 DIAGNOSIS — G4733 Obstructive sleep apnea (adult) (pediatric): Secondary | ICD-10-CM | POA: Diagnosis not present

## 2020-03-19 DIAGNOSIS — G4733 Obstructive sleep apnea (adult) (pediatric): Secondary | ICD-10-CM | POA: Diagnosis not present

## 2020-04-10 DIAGNOSIS — G4733 Obstructive sleep apnea (adult) (pediatric): Secondary | ICD-10-CM | POA: Diagnosis not present

## 2020-04-16 DIAGNOSIS — G4733 Obstructive sleep apnea (adult) (pediatric): Secondary | ICD-10-CM | POA: Diagnosis not present

## 2020-05-17 DIAGNOSIS — G4733 Obstructive sleep apnea (adult) (pediatric): Secondary | ICD-10-CM | POA: Diagnosis not present

## 2020-05-20 DIAGNOSIS — D692 Other nonthrombocytopenic purpura: Secondary | ICD-10-CM | POA: Diagnosis not present

## 2020-05-20 DIAGNOSIS — E890 Postprocedural hypothyroidism: Secondary | ICD-10-CM | POA: Diagnosis not present

## 2020-05-20 DIAGNOSIS — N1831 Chronic kidney disease, stage 3a: Secondary | ICD-10-CM | POA: Diagnosis not present

## 2020-05-20 DIAGNOSIS — E785 Hyperlipidemia, unspecified: Secondary | ICD-10-CM | POA: Diagnosis not present

## 2020-05-20 DIAGNOSIS — I2510 Atherosclerotic heart disease of native coronary artery without angina pectoris: Secondary | ICD-10-CM | POA: Diagnosis not present

## 2020-05-20 DIAGNOSIS — E119 Type 2 diabetes mellitus without complications: Secondary | ICD-10-CM | POA: Diagnosis not present

## 2020-05-20 DIAGNOSIS — K8301 Primary sclerosing cholangitis: Secondary | ICD-10-CM | POA: Diagnosis not present

## 2020-05-20 DIAGNOSIS — D696 Thrombocytopenia, unspecified: Secondary | ICD-10-CM | POA: Diagnosis not present

## 2020-05-20 DIAGNOSIS — K744 Secondary biliary cirrhosis: Secondary | ICD-10-CM | POA: Diagnosis not present

## 2020-05-20 DIAGNOSIS — I129 Hypertensive chronic kidney disease with stage 1 through stage 4 chronic kidney disease, or unspecified chronic kidney disease: Secondary | ICD-10-CM | POA: Diagnosis not present

## 2020-05-20 DIAGNOSIS — I272 Pulmonary hypertension, unspecified: Secondary | ICD-10-CM | POA: Diagnosis not present

## 2020-05-20 DIAGNOSIS — E1122 Type 2 diabetes mellitus with diabetic chronic kidney disease: Secondary | ICD-10-CM | POA: Diagnosis not present

## 2020-05-20 DIAGNOSIS — M109 Gout, unspecified: Secondary | ICD-10-CM | POA: Diagnosis not present

## 2020-05-21 ENCOUNTER — Other Ambulatory Visit: Payer: Self-pay | Admitting: Internal Medicine

## 2020-05-21 DIAGNOSIS — K744 Secondary biliary cirrhosis: Secondary | ICD-10-CM

## 2020-05-21 DIAGNOSIS — K8301 Primary sclerosing cholangitis: Secondary | ICD-10-CM

## 2020-05-26 ENCOUNTER — Other Ambulatory Visit: Payer: Self-pay

## 2020-05-26 ENCOUNTER — Ambulatory Visit
Admission: RE | Admit: 2020-05-26 | Discharge: 2020-05-26 | Disposition: A | Discharge status: Home / Self Care | Payer: Medicare HMO | Source: Ambulatory Visit | Attending: Internal Medicine | Admitting: Internal Medicine

## 2020-05-26 DIAGNOSIS — K744 Secondary biliary cirrhosis: Secondary | ICD-10-CM | POA: Insufficient documentation

## 2020-05-26 DIAGNOSIS — K8301 Primary sclerosing cholangitis: Secondary | ICD-10-CM | POA: Insufficient documentation

## 2020-06-04 DIAGNOSIS — G4733 Obstructive sleep apnea (adult) (pediatric): Secondary | ICD-10-CM | POA: Diagnosis not present

## 2020-06-04 DIAGNOSIS — Z955 Presence of coronary angioplasty implant and graft: Secondary | ICD-10-CM | POA: Diagnosis not present

## 2020-06-04 DIAGNOSIS — R0602 Shortness of breath: Secondary | ICD-10-CM | POA: Diagnosis not present

## 2020-06-04 DIAGNOSIS — I208 Other forms of angina pectoris: Secondary | ICD-10-CM | POA: Diagnosis not present

## 2020-06-04 DIAGNOSIS — I2510 Atherosclerotic heart disease of native coronary artery without angina pectoris: Secondary | ICD-10-CM | POA: Diagnosis not present

## 2020-06-04 DIAGNOSIS — N183 Chronic kidney disease, stage 3 unspecified: Secondary | ICD-10-CM | POA: Diagnosis not present

## 2020-06-04 DIAGNOSIS — E782 Mixed hyperlipidemia: Secondary | ICD-10-CM | POA: Diagnosis not present

## 2020-06-13 ENCOUNTER — Emergency Department
Admission: EM | Admit: 2020-06-13 | Discharge: 2020-06-13 | Disposition: A | Discharge status: Home / Self Care | Payer: Medicare HMO | Attending: Emergency Medicine | Admitting: Emergency Medicine

## 2020-06-13 ENCOUNTER — Emergency Department: Payer: Medicare HMO

## 2020-06-13 ENCOUNTER — Encounter: Payer: Self-pay | Admitting: Emergency Medicine

## 2020-06-13 ENCOUNTER — Other Ambulatory Visit: Payer: Self-pay

## 2020-06-13 DIAGNOSIS — I10 Essential (primary) hypertension: Secondary | ICD-10-CM | POA: Insufficient documentation

## 2020-06-13 DIAGNOSIS — S0181XA Laceration without foreign body of other part of head, initial encounter: Secondary | ICD-10-CM | POA: Diagnosis not present

## 2020-06-13 DIAGNOSIS — W01198A Fall on same level from slipping, tripping and stumbling with subsequent striking against other object, initial encounter: Secondary | ICD-10-CM | POA: Diagnosis not present

## 2020-06-13 DIAGNOSIS — S199XXA Unspecified injury of neck, initial encounter: Secondary | ICD-10-CM | POA: Diagnosis not present

## 2020-06-13 DIAGNOSIS — Z23 Encounter for immunization: Secondary | ICD-10-CM | POA: Insufficient documentation

## 2020-06-13 DIAGNOSIS — Z79899 Other long term (current) drug therapy: Secondary | ICD-10-CM | POA: Diagnosis not present

## 2020-06-13 DIAGNOSIS — Z955 Presence of coronary angioplasty implant and graft: Secondary | ICD-10-CM | POA: Diagnosis not present

## 2020-06-13 DIAGNOSIS — Z7984 Long term (current) use of oral hypoglycemic drugs: Secondary | ICD-10-CM | POA: Insufficient documentation

## 2020-06-13 DIAGNOSIS — E119 Type 2 diabetes mellitus without complications: Secondary | ICD-10-CM | POA: Insufficient documentation

## 2020-06-13 DIAGNOSIS — S01419A Laceration without foreign body of unspecified cheek and temporomandibular area, initial encounter: Secondary | ICD-10-CM | POA: Diagnosis not present

## 2020-06-13 DIAGNOSIS — S0990XA Unspecified injury of head, initial encounter: Secondary | ICD-10-CM | POA: Diagnosis present

## 2020-06-13 DIAGNOSIS — Z7982 Long term (current) use of aspirin: Secondary | ICD-10-CM | POA: Insufficient documentation

## 2020-06-13 DIAGNOSIS — M4319 Spondylolisthesis, multiple sites in spine: Secondary | ICD-10-CM | POA: Diagnosis not present

## 2020-06-13 MED ORDER — TETANUS-DIPHTH-ACELL PERTUSSIS 5-2.5-18.5 LF-MCG/0.5 IM SUSY
0.5000 mL | PREFILLED_SYRINGE | Freq: Once | INTRAMUSCULAR | Status: AC
  Administered 2020-06-13: 0.5 mL via INTRAMUSCULAR
  Filled 2020-06-13: qty 0.5

## 2020-06-13 MED ORDER — DOXYCYCLINE HYCLATE 100 MG PO TBEC: 100.0000 mg | DELAYED_RELEASE_TABLET | Freq: Two times a day (BID) | ORAL | 0 refills | Status: AC

## 2020-06-13 MED ORDER — OXYCODONE-ACETAMINOPHEN 5-325 MG PO TABS
1.0000 | ORAL_TABLET | Freq: Once | ORAL | Status: AC
  Administered 2020-06-13: 1 via ORAL
  Filled 2020-06-13: qty 1

## 2020-06-13 MED ORDER — ONDANSETRON 4 MG PO TBDP
4.0000 mg | ORAL_TABLET | Freq: Once | ORAL | Status: AC
  Administered 2020-06-13: 4 mg via ORAL
  Filled 2020-06-13: qty 1

## 2020-06-13 MED ORDER — LIDOCAINE-EPINEPHRINE (PF) 1 %-1:200000 IJ SOLN
20.0000 mL | Freq: Once | INTRAMUSCULAR | Status: AC
  Administered 2020-06-13: 20 mL
  Filled 2020-06-13: qty 20

## 2020-06-13 NOTE — ED Triage Notes (Signed)
Tripped on a piece of the tractor and fell, chin laceration.  Bleeding controlled.

## 2020-06-13 NOTE — ED Notes (Signed)
See triage note  States tripped   [place] hitting the tractor  Laceration noted to chin  No LOC and denies any other pain

## 2020-06-13 NOTE — Discharge Instructions (Signed)
Take Doxycycline twice daily for seven days.

## 2020-06-13 NOTE — ED Provider Notes (Signed)
[facility]-EMERGENCY DEPARTMENT  ____________________________________________  Time seen: Approximately 6:50 PM  I have reviewed the triage vital signs and the nursing notes.   HISTORY  Chief Complaint Facial Laceration   Historian Patient     HPI Jesse Thompson is a 70 y.o. male presents to the emergency department with a 6 cm chin laceration after patient tripped on his tractor and fell, hitting his head against a bucket.  Patient has lower right jaw pain.  He denies loss of consciousness.  No neck pain or numbness and tingling in the upper and lower extremities.  He cannot recall his last tetanus shot.    Past Medical History:  Diagnosis Date  . Bursitis   . Collagen vascular disease ([place])   . Diabetes mellitus without complication ([place])   . GERD (gastroesophageal reflux disease)   . Gout   . HOH (hard of hearing)    Bilateral Hearing Aids  . Hyperlipidemia   . Hypertension   . Osteoarthritis   . Osteoarthritis   . 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 Vertigo      Immunizations up to date:  Yes.     Past Medical History:  Diagnosis Date  . Bursitis   . Collagen vascular disease ([place])   . Diabetes mellitus without complication ([place])   . GERD (gastroesophageal reflux disease)   . Gout   . HOH (hard of hearing)    Bilateral Hearing Aids  . Hyperlipidemia   . Hypertension   . Osteoarthritis   . Osteoarthritis   . 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 Vertigo     Patient Active Problem List   Diagnosis Date Noted  . Non-STEMI (non-ST elevated myocardial infarction) ([place]) 09/13/2019  . S/P drug eluting coronary stent placement 09/13/2019  . Sepsis ([place]) 11/04/2017  . Osteoarthritis     Past Surgical History:  Procedure Laterality Date  . BILE DUCT STENT PLACEMENT  2015   now out  . CARPAL TUNNEL RELEASE Right 09/15/2016   Procedure: CARPAL TUNNEL RELEASE;  Surgeon: Earnestine Leys, MD;  Location: [facility] ORS;  Service: Orthopedics;  Laterality: Right;  . CARPAL  TUNNEL RELEASE Left 09/29/2016   Procedure: CARPAL TUNNEL RELEASE;  Surgeon: Earnestine Leys, MD;  Location: [facility] ORS;  Service: Orthopedics;  Laterality: Left;  sutures removed right hand from status post carpal tunnel  . CORONARY STENT INTERVENTION N/A 09/13/2019   Procedure: CORONARY STENT INTERVENTION;  Surgeon: Yolonda Kida, MD;  Location: [place] [facility];  Service: Cardiovascular;  Laterality: N/A;  LAD  . FRACTURE SURGERY Right    Ankle  . RIGHT/LEFT HEART CATH AND CORONARY ANGIOGRAPHY N/A 09/13/2019   Procedure: RIGHT/LEFT HEART CATH AND CORONARY ANGIOGRAPHY;  Surgeon: Yolonda Kida, MD;  Location: [place] [facility];  Service: Cardiovascular;  Laterality: N/A;  . TOTAL THYROIDECTOMY      Prior to Admission medications   Medication Sig Start Date End Date Taking? Authorizing Provider  doxycycline (DORYX) 100 MG EC tablet Take 1 tablet (100 mg total) by mouth 2 (two) times daily for 7 days. 06/13/20 06/20/20 Yes Vallarie Mare M, PA-C  acetaminophen (TYLENOL) 500 MG tablet Take 500 mg by mouth every 6 (six) hours as needed.    [provider]  allopurinol (ZYLOPRIM) 100 MG tablet Take 100 mg by mouth daily. 07/16/16   [provider]  amLODipine (NORVASC) 10 MG tablet Take 10 mg by mouth daily. 09/12/17   [provider]  aspirin EC 81 MG tablet Take 81 mg by mouth  daily. Swallow whole.    [provider]  bisoprolol (ZEBETA) 5 MG tablet Take 2.5 mg by mouth daily. 09/12/17   [provider]  cholecalciferol (VITAMIN D3) 25 MCG (1000 UNIT) tablet Take 1,000 Units by mouth daily.    [provider]  diclofenac sodium (VOLTAREN) 1 % GEL Apply 2 g topically 3 (three) times daily as needed for pain. Patient not taking: Reported on 09/13/2019 10/26/17   [provider]  famotidine (PEPCID) 20 MG tablet Take 20 mg by mouth every evening. 03/01/17   [provider]  furosemide (LASIX) 20 MG tablet Take 20 mg by mouth 2 (two)  times daily.    [provider]  gabapentin (NEURONTIN) 400 MG capsule Take 1 capsule (400 mg total) by mouth 2 (two) times daily. 09/15/16   Earnestine Leys, MD  glipiZIDE (GLUCOTROL XL) 2.5 MG 24 hr tablet Take 2.5 mg by mouth daily. 07/29/16   [provider]  hydrALAZINE (APRESOLINE) 100 MG tablet Take 100 mg by mouth 3 (three) times daily.    [provider]  levothyroxine (SYNTHROID, LEVOTHROID) 100 MCG tablet Take 100 mcg by mouth daily before breakfast. 07/16/16   [provider]  meloxicam (MOBIC) 15 MG tablet Take 1 tablet (15 mg total) by mouth daily. Patient taking differently: Take 15 mg by mouth every evening.  09/15/16   Earnestine Leys, MD  metFORMIN (GLUCOPHAGE) 1000 MG tablet Take 1,000 mg by mouth 2 (two) times daily. 07/16/16   [provider]  Omega-3 Fatty Acids (FISH OIL) 1000 MG CAPS Take 1,000 mg by mouth daily.    [provider]  omeprazole (PRILOSEC) 40 MG capsule Take 40 mg by mouth daily before breakfast.  06/05/14   [provider]  rosuvastatin (CRESTOR) 5 MG tablet Take 5 mg by mouth daily.    [provider]  ticagrelor (BRILINTA) 90 MG TABS tablet Take 1 tablet (90 mg total) by mouth 2 (two) times daily. 09/14/19   Callwood, Dwayne D, MD  valsartan (DIOVAN) 80 MG tablet Take 80 mg by mouth daily.    [provider]  vitamin B-12 (CYANOCOBALAMIN) 1000 MCG tablet Take 1,000 mcg by mouth daily.    [provider]    Allergies Metronidazole and Penicillin g  Family History  Problem Relation Age of Onset  . Hypertension Mother   . Hypertension Father     Social History Social History   Tobacco Use  . Smoking status: Never Smoker  . Smokeless tobacco: Never Used  Vaping Use  . Vaping Use: Never used  Substance Use Topics  . Alcohol use: No    Alcohol/week: 0.0 standard drinks  . Drug use: No     Review of Systems  Constitutional: No fever/chills Eyes:  No discharge ENT:  No upper respiratory complaints. Respiratory: no cough. No SOB/ use of accessory muscles to breath Gastrointestinal:   No nausea, no vomiting.  No diarrhea.  No constipation. Musculoskeletal: Negative for musculoskeletal pain. Skin: Patient has chin laceration.     ____________________________________________   PHYSICAL EXAM:  VITAL SIGNS: ED Triage Vitals  Enc Vitals Group     BP 06/13/20 1620 138/81     Pulse Rate 06/13/20 1620 (!) 57     Resp 06/13/20 1620 18     Temp 06/13/20 1620 98.2 F (36.8 C)     Temp Source 06/13/20 1620 Oral     SpO2 06/13/20 1620 97 %     Weight 06/13/20 1617 200  lb 9.9 oz (91 kg)     Height 06/13/20 1623 5\' 7"  (1.702 m)     Head Circumference --      Peak Flow --      Pain Score 06/13/20 1617 0     Pain Loc --      Pain Edu? --      Excl. in [place]? --      Constitutional: Alert and oriented. Well appearing and in no acute distress. Eyes: Conjunctivae are normal. PERRL. EOMI. Head: Atraumatic.  Patient has 6 cm, jagged chin laceration deep to muscle. ENT:      Nose: No congestion/rhinnorhea.      Mouth/Throat: Mucous membranes are moist.  Neck: No stridor.  No cervical spine tenderness to palpation. Cardiovascular: Normal rate, regular rhythm. Normal S1 and S2.  Good peripheral circulation. Respiratory: Normal respiratory effort without tachypnea or retractions. Lungs CTAB. Good air entry to the bases with no decreased or absent breath sounds Gastrointestinal: Bowel sounds x 4 quadrants. Soft and nontender to palpation. No guarding or rigidity. No distention. Musculoskeletal: Full range of motion to all extremities. No obvious deformities noted Neurologic:  Normal for age. No gross focal neurologic deficits are appreciated.  Skin: Patient has a 6 cm, linear laceration of the right lower jaw deep to underlying muscle. Psychiatric: Mood and affect are normal for age. Speech and behavior are normal.    ____________________________________________   LABS (all labs ordered are listed, but only abnormal results are displayed)  Labs Reviewed - No data to display ____________________________________________  EKG   ____________________________________________  RADIOLOGY Unk Pinto, personally viewed and evaluated these images (plain radiographs) as part of my medical decision making, as well as reviewing the written report by the radiologist.  CT Head Wo Contrast  Result Date: 06/13/2020 CLINICAL DATA:  Fall. EXAM: CT HEAD WITHOUT CONTRAST CT MAXILLOFACIAL WITHOUT CONTRAST CT CERVICAL SPINE WITHOUT CONTRAST TECHNIQUE: Multidetector CT imaging of the head, cervical spine, and maxillofacial structures were performed using the standard protocol without intravenous contrast. Multiplanar CT image reconstructions of the cervical spine and maxillofacial structures were also generated. COMPARISON:  CT head dated July 21, 2019. FINDINGS: CT HEAD FINDINGS Brain: No evidence of acute infarction, hemorrhage, hydrocephalus, extra-axial collection or mass lesion/mass effect. Stable moderate chronic microvascular ischemic changes. Vascular: Calcified atherosclerosis at the skullbase. No hyperdense vessel. Skull: Normal. Negative for fracture or focal lesion. Other: None. CT MAXILLOFACIAL FINDINGS Osseous: No fracture or mandibular dislocation. No destructive process. Bilateral TMJ osteoarthritis. Orbits: Negative. No traumatic or inflammatory finding. Sinuses: No acute finding. Soft tissues: Chin soft tissue laceration right of midline. CT CERVICAL SPINE FINDINGS Alignment: No traumatic malalignment. Trace anterolisthesis from C5-C6 through C7-T1. Skull base and vertebrae: No acute fracture. No primary bone lesion or focal pathologic process. Soft tissues and spinal canal: No prevertebral fluid or swelling. No visible canal hematoma. Disc levels: Disc heights are relatively preserved. Scattered mild facet  arthropathy. Upper chest: Negative. Other: None. IMPRESSION: 1. No acute intracranial abnormality. Stable moderate chronic microvascular ischemic changes. 2. No acute maxillofacial fracture. Chin soft tissue laceration right of midline. 3. No acute cervical spine fracture or traumatic listhesis. Electronically Signed   By: Titus Dubin M.D.   On: 06/13/2020 17:11   CT Cervical Spine Wo Contrast  Result Date: 06/13/2020 CLINICAL DATA:  [place]. EXAM: CT HEAD WITHOUT CONTRAST CT MAXILLOFACIAL WITHOUT CONTRAST CT CERVICAL SPINE WITHOUT CONTRAST TECHNIQUE: Multidetector CT imaging of the head, cervical spine, and maxillofacial structures were performed using  the standard protocol without intravenous contrast. Multiplanar CT image reconstructions of the cervical spine and maxillofacial structures were also generated. COMPARISON:  CT head dated July 21, 2019. FINDINGS: CT HEAD FINDINGS Brain: No evidence of acute infarction, hemorrhage, hydrocephalus, extra-axial collection or mass lesion/mass effect. Stable moderate chronic microvascular ischemic changes. Vascular: Calcified atherosclerosis at the skullbase. No hyperdense vessel. Skull: Normal. Negative for fracture or focal lesion. Other: None. CT MAXILLOFACIAL FINDINGS Osseous: No fracture or mandibular dislocation. No destructive process. Bilateral TMJ osteoarthritis. Orbits: Negative. No traumatic or inflammatory finding. Sinuses: No acute finding. Soft tissues: Chin soft tissue laceration right of midline. CT CERVICAL SPINE FINDINGS Alignment: No traumatic malalignment. Trace anterolisthesis from C5-C6 through C7-T1. Skull base and vertebrae: No acute fracture. No primary bone lesion or focal pathologic process. Soft tissues and spinal canal: No prevertebral fluid or swelling. No visible canal hematoma. Disc levels: Disc heights are relatively preserved. Scattered mild facet arthropathy. Upper chest: Negative. Other: None. IMPRESSION: 1. No acute intracranial  abnormality. Stable moderate chronic microvascular ischemic changes. 2. No acute maxillofacial fracture. Chin soft tissue laceration right of midline. 3. No acute cervical spine fracture or traumatic listhesis. Electronically Signed   By: Titus Dubin M.D.   On: 06/13/2020 17:11   CT Maxillofacial Wo Contrast  Result Date: 06/13/2020 CLINICAL DATA:  [place]. EXAM: CT HEAD WITHOUT CONTRAST CT MAXILLOFACIAL WITHOUT CONTRAST CT CERVICAL SPINE WITHOUT CONTRAST TECHNIQUE: Multidetector CT imaging of the head, cervical spine, and maxillofacial structures were performed using the standard protocol without intravenous contrast. Multiplanar CT image reconstructions of the cervical spine and maxillofacial structures were also generated. COMPARISON:  CT head dated July 21, 2019. FINDINGS: CT HEAD FINDINGS Brain: No evidence of acute infarction, hemorrhage, hydrocephalus, extra-axial collection or mass lesion/mass effect. Stable moderate chronic microvascular ischemic changes. Vascular: Calcified atherosclerosis at the skullbase. No hyperdense vessel. Skull: Normal. Negative for fracture or focal lesion. Other: None. CT MAXILLOFACIAL FINDINGS Osseous: No fracture or mandibular dislocation. No destructive process. Bilateral TMJ osteoarthritis. Orbits: Negative. No traumatic or inflammatory finding. Sinuses: No acute finding. Soft tissues: Chin soft tissue laceration right of midline. CT CERVICAL SPINE FINDINGS Alignment: No traumatic malalignment. Trace anterolisthesis from C5-C6 through C7-T1. Skull base and vertebrae: No acute fracture. No primary bone lesion or focal pathologic process. Soft tissues and spinal canal: No prevertebral fluid or swelling. No visible canal hematoma. Disc levels: Disc heights are relatively preserved. Scattered mild facet arthropathy. Upper chest: Negative. Other: None. IMPRESSION: 1. No acute intracranial abnormality. Stable moderate chronic microvascular ischemic changes. 2. No acute  maxillofacial fracture. Chin soft tissue laceration right of midline. 3. No acute cervical spine fracture or traumatic listhesis. Electronically Signed   By: Titus Dubin M.D.   On: 06/13/2020 17:11    ____________________________________________    PROCEDURES  Procedure(s) performed:     Marland KitchenMarland KitchenLaceration Repair  Date/Time: 06/13/2020 6:53 PM Performed by: Lannie Fields, PA-C Authorized by: Lannie Fields, PA-C   Consent:    Consent obtained:  Verbal   Consent given by:  Patient   Risks discussed:  Infection and pain   Alternatives discussed:  No treatment Universal protocol:    Procedure explained and questions answered to patient or proxy's satisfaction: yes     Patient identity confirmed:  Verbally with patient Anesthesia:    Anesthesia method:  None Laceration details:    Location:  Face   Face location:  Chin   Length (cm):  6   Depth (mm):  5 Pre-procedure details:  Preparation:  Patient was prepped and draped in usual sterile fashion Exploration:    Contaminated: no   Treatment:    Area cleansed with:  Povidone-iodine   Amount of cleaning:  Standard   Irrigation solution:  Sterile saline   Visualized foreign bodies/material removed: no     Layers/structures repaired:  Deep subcutaneous and deep dermal/superficial fascia Deep subcutaneous:    Suture size:  5-0   Suture material:  Monocryl   Suture technique:  Simple interrupted and running   Number of sutures:  8 Skin repair:    Repair method:  Sutures   Suture size:  5-0   Suture material:  Nylon   Suture technique:  Running locked   Number of sutures:  12 Approximation:    Approximation:  Close Repair type:    Repair type:  Simple Post-procedure details:    Dressing:  Open (no dressing)   Procedure completion:  Tolerated well, no immediate complications       Medications  oxyCODONE-acetaminophen (PERCOCET/ROXICET) 5-325 MG per tablet 1 tablet (1 tablet Oral Given 06/13/20 1635)   ondansetron (ZOFRAN-ODT) disintegrating tablet 4 mg (4 mg Oral Given 06/13/20 1635)  lidocaine-EPINEPHrine (XYLOCAINE-EPINEPHrine) 1 %-1:200000 (PF) injection 20 mL (20 mLs Infiltration Given by Other 06/13/20 1717)  Tdap (BOOSTRIX) injection 0.5 mL (0.5 mLs Intramuscular Given 06/13/20 1803)     ____________________________________________   INITIAL IMPRESSION / ASSESSMENT AND PLAN / ED COURSE  Pertinent labs & imaging results that were available during my care of the patient were reviewed by me and considered in my medical decision making (see chart for details).       Assessment and plan Facial laceration 65-year-old male presents to the emergency department with a 6 cm laceration along right chin.  CTs of the face, head and neck revealed no evidence of facial fracture, intracranial bleed, skull fracture or other acute abnormality.  Laceration was repaired without complication.  He was advised to have external sutures removed by primary care in 5 to 7 days.  He was discharged with doxycycline and his tetanus status was updated in the emergency department.    ____________________________________________  FINAL CLINICAL IMPRESSION(S) / ED DIAGNOSES  Final diagnoses:  Facial laceration, initial encounter      NEW MEDICATIONS STARTED DURING THIS VISIT:  ED Discharge Orders         Ordered    doxycycline (DORYX) 100 MG EC tablet  2 times daily        06/13/20 1755              This chart was dictated using voice recognition software/Dragon. Despite best efforts to proofread, errors can occur which can change the meaning. Any change was purely unintentional.     Lannie Fields, PA-C 06/13/20 1856    Harvest Dark, MD 06/16/20 1510

## 2020-06-16 DIAGNOSIS — G4733 Obstructive sleep apnea (adult) (pediatric): Secondary | ICD-10-CM | POA: Diagnosis not present

## 2020-07-17 DIAGNOSIS — G4733 Obstructive sleep apnea (adult) (pediatric): Secondary | ICD-10-CM | POA: Diagnosis not present

## 2020-07-29 DIAGNOSIS — I2510 Atherosclerotic heart disease of native coronary artery without angina pectoris: Secondary | ICD-10-CM | POA: Diagnosis not present

## 2020-07-29 DIAGNOSIS — I208 Other forms of angina pectoris: Secondary | ICD-10-CM | POA: Diagnosis not present

## 2020-07-29 DIAGNOSIS — R0602 Shortness of breath: Secondary | ICD-10-CM | POA: Diagnosis not present

## 2020-08-13 DIAGNOSIS — G4733 Obstructive sleep apnea (adult) (pediatric): Secondary | ICD-10-CM | POA: Diagnosis not present

## 2020-08-16 DIAGNOSIS — G4733 Obstructive sleep apnea (adult) (pediatric): Secondary | ICD-10-CM | POA: Diagnosis not present

## 2020-08-28 DIAGNOSIS — I208 Other forms of angina pectoris: Secondary | ICD-10-CM | POA: Diagnosis not present

## 2020-08-28 DIAGNOSIS — R0602 Shortness of breath: Secondary | ICD-10-CM | POA: Diagnosis not present

## 2020-08-28 DIAGNOSIS — I2510 Atherosclerotic heart disease of native coronary artery without angina pectoris: Secondary | ICD-10-CM | POA: Diagnosis not present

## 2020-09-08 DIAGNOSIS — E119 Type 2 diabetes mellitus without complications: Secondary | ICD-10-CM | POA: Diagnosis not present

## 2020-09-08 DIAGNOSIS — R600 Localized edema: Secondary | ICD-10-CM | POA: Diagnosis not present

## 2020-09-08 DIAGNOSIS — Z955 Presence of coronary angioplasty implant and graft: Secondary | ICD-10-CM | POA: Diagnosis not present

## 2020-09-08 DIAGNOSIS — Z8673 Personal history of transient ischemic attack (TIA), and cerebral infarction without residual deficits: Secondary | ICD-10-CM | POA: Diagnosis not present

## 2020-09-08 DIAGNOSIS — N1831 Chronic kidney disease, stage 3a: Secondary | ICD-10-CM | POA: Diagnosis not present

## 2020-09-08 DIAGNOSIS — I2510 Atherosclerotic heart disease of native coronary artery without angina pectoris: Secondary | ICD-10-CM | POA: Diagnosis not present

## 2020-09-08 DIAGNOSIS — R0609 Other forms of dyspnea: Secondary | ICD-10-CM | POA: Diagnosis not present

## 2020-09-08 DIAGNOSIS — I10 Essential (primary) hypertension: Secondary | ICD-10-CM | POA: Diagnosis not present

## 2020-09-08 DIAGNOSIS — E782 Mixed hyperlipidemia: Secondary | ICD-10-CM | POA: Diagnosis not present

## 2020-09-16 DIAGNOSIS — G4733 Obstructive sleep apnea (adult) (pediatric): Secondary | ICD-10-CM | POA: Diagnosis not present

## 2020-09-23 DIAGNOSIS — I129 Hypertensive chronic kidney disease with stage 1 through stage 4 chronic kidney disease, or unspecified chronic kidney disease: Secondary | ICD-10-CM | POA: Diagnosis not present

## 2020-09-23 DIAGNOSIS — G4733 Obstructive sleep apnea (adult) (pediatric): Secondary | ICD-10-CM | POA: Diagnosis not present

## 2020-09-23 DIAGNOSIS — J90 Pleural effusion, not elsewhere classified: Secondary | ICD-10-CM | POA: Diagnosis not present

## 2020-09-23 DIAGNOSIS — N1831 Chronic kidney disease, stage 3a: Secondary | ICD-10-CM | POA: Diagnosis not present

## 2020-09-23 DIAGNOSIS — E119 Type 2 diabetes mellitus without complications: Secondary | ICD-10-CM | POA: Diagnosis not present

## 2020-09-23 DIAGNOSIS — Z0000 Encounter for general adult medical examination without abnormal findings: Secondary | ICD-10-CM | POA: Diagnosis not present

## 2020-09-23 DIAGNOSIS — D692 Other nonthrombocytopenic purpura: Secondary | ICD-10-CM | POA: Diagnosis not present

## 2020-09-23 DIAGNOSIS — K744 Secondary biliary cirrhosis: Secondary | ICD-10-CM | POA: Diagnosis not present

## 2020-09-23 DIAGNOSIS — E1122 Type 2 diabetes mellitus with diabetic chronic kidney disease: Secondary | ICD-10-CM | POA: Diagnosis not present

## 2020-09-23 DIAGNOSIS — I272 Pulmonary hypertension, unspecified: Secondary | ICD-10-CM | POA: Diagnosis not present

## 2020-09-23 DIAGNOSIS — E890 Postprocedural hypothyroidism: Secondary | ICD-10-CM | POA: Diagnosis not present

## 2020-09-23 DIAGNOSIS — E785 Hyperlipidemia, unspecified: Secondary | ICD-10-CM | POA: Diagnosis not present

## 2020-09-25 ENCOUNTER — Ambulatory Visit: Admit: 2020-09-25 | Payer: Medicare HMO | Admitting: Internal Medicine

## 2020-09-25 SURGERY — LEFT HEART CATH AND CORONARY ANGIOGRAPHY
Anesthesia: Moderate Sedation

## 2020-10-14 ENCOUNTER — Other Ambulatory Visit
Admission: RE | Admit: 2020-10-14 | Discharge: 2020-10-14 | Disposition: A | Discharge status: Home / Self Care | Payer: Medicare HMO | Source: Ambulatory Visit | Attending: Student | Admitting: Student

## 2020-10-14 DIAGNOSIS — I10 Essential (primary) hypertension: Secondary | ICD-10-CM | POA: Diagnosis not present

## 2020-10-14 DIAGNOSIS — R0609 Other forms of dyspnea: Secondary | ICD-10-CM | POA: Insufficient documentation

## 2020-10-14 DIAGNOSIS — I2510 Atherosclerotic heart disease of native coronary artery without angina pectoris: Secondary | ICD-10-CM | POA: Diagnosis not present

## 2020-10-14 DIAGNOSIS — E119 Type 2 diabetes mellitus without complications: Secondary | ICD-10-CM | POA: Diagnosis not present

## 2020-10-14 DIAGNOSIS — Z01818 Encounter for other preprocedural examination: Secondary | ICD-10-CM | POA: Diagnosis not present

## 2020-10-14 DIAGNOSIS — Z8673 Personal history of transient ischemic attack (TIA), and cerebral infarction without residual deficits: Secondary | ICD-10-CM | POA: Diagnosis not present

## 2020-10-14 DIAGNOSIS — E782 Mixed hyperlipidemia: Secondary | ICD-10-CM | POA: Diagnosis not present

## 2020-10-14 DIAGNOSIS — N1831 Chronic kidney disease, stage 3a: Secondary | ICD-10-CM | POA: Diagnosis not present

## 2020-10-14 DIAGNOSIS — Z955 Presence of coronary angioplasty implant and graft: Secondary | ICD-10-CM | POA: Diagnosis not present

## 2020-10-14 LAB — BRAIN NATRIURETIC PEPTIDE: B Natriuretic Peptide: 266.5 pg/mL — ABNORMAL HIGH (ref 0.0–100.0)

## 2020-10-27 ENCOUNTER — Encounter
Admission: RE | Disposition: A | Discharge status: Home / Self Care | Payer: Self-pay | Source: Ambulatory Visit | Attending: Internal Medicine

## 2020-10-27 ENCOUNTER — Other Ambulatory Visit: Payer: Self-pay

## 2020-10-27 ENCOUNTER — Encounter: Payer: Self-pay | Admitting: Internal Medicine

## 2020-10-27 ENCOUNTER — Ambulatory Visit
Admission: RE | Admit: 2020-10-27 | Discharge: 2020-10-27 | Disposition: A | Discharge status: Home / Self Care | Payer: Medicare HMO | Source: Ambulatory Visit | Attending: Internal Medicine | Admitting: Internal Medicine

## 2020-10-27 DIAGNOSIS — I2510 Atherosclerotic heart disease of native coronary artery without angina pectoris: Secondary | ICD-10-CM | POA: Insufficient documentation

## 2020-10-27 DIAGNOSIS — R9439 Abnormal result of other cardiovascular function study: Secondary | ICD-10-CM | POA: Diagnosis present

## 2020-10-27 DIAGNOSIS — I208 Other forms of angina pectoris: Secondary | ICD-10-CM | POA: Diagnosis not present

## 2020-10-27 DIAGNOSIS — Z955 Presence of coronary angioplasty implant and graft: Secondary | ICD-10-CM | POA: Insufficient documentation

## 2020-10-27 HISTORY — PX: LEFT HEART CATH AND CORONARY ANGIOGRAPHY: CATH118249

## 2020-10-27 LAB — GLUCOSE, CAPILLARY: Glucose-Capillary: 78 mg/dL (ref 70–99)

## 2020-10-27 SURGERY — LEFT HEART CATH AND CORONARY ANGIOGRAPHY
Anesthesia: Moderate Sedation | Laterality: Left

## 2020-10-27 MED ORDER — ONDANSETRON HCL 4 MG/2ML IJ SOLN: 4.0000 mg | Freq: Four times a day (QID) | INTRAMUSCULAR | Status: DC | PRN

## 2020-10-27 MED ORDER — MIDAZOLAM HCL 2 MG/2ML IJ SOLN
INTRAMUSCULAR | Status: AC
  Filled 2020-10-27: qty 2

## 2020-10-27 MED ORDER — ACETAMINOPHEN 325 MG PO TABS: 650.0000 mg | ORAL_TABLET | ORAL | Status: DC | PRN

## 2020-10-27 MED ORDER — SODIUM CHLORIDE 0.9 % IV SOLN: 250.0000 mL | INTRAVENOUS | Status: DC | PRN

## 2020-10-27 MED ORDER — SODIUM CHLORIDE 0.9% FLUSH: 3.0000 mL | Freq: Two times a day (BID) | INTRAVENOUS | Status: DC

## 2020-10-27 MED ORDER — VERAPAMIL HCL 2.5 MG/ML IV SOLN
INTRAVENOUS | Status: AC
  Filled 2020-10-27: qty 2

## 2020-10-27 MED ORDER — SODIUM CHLORIDE 0.9 % WEIGHT BASED INFUSION: 1.0000 mL/kg/h | INTRAVENOUS | Status: DC

## 2020-10-27 MED ORDER — FENTANYL CITRATE PF 50 MCG/ML IJ SOSY
PREFILLED_SYRINGE | INTRAMUSCULAR | Status: AC
  Filled 2020-10-27: qty 1

## 2020-10-27 MED ORDER — HYDRALAZINE HCL 20 MG/ML IJ SOLN: 10.0000 mg | INTRAMUSCULAR | Status: DC | PRN

## 2020-10-27 MED ORDER — IOHEXOL 350 MG/ML SOLN
INTRAVENOUS | Status: DC | PRN
  Administered 2020-10-27: 94 mL via INTRACARDIAC

## 2020-10-27 MED ORDER — MIDAZOLAM HCL 2 MG/2ML IJ SOLN
INTRAMUSCULAR | Status: DC | PRN
  Administered 2020-10-27 (×2): 1 mg via INTRAVENOUS

## 2020-10-27 MED ORDER — SODIUM CHLORIDE 0.9% FLUSH: 3.0000 mL | INTRAVENOUS | Status: DC | PRN

## 2020-10-27 MED ORDER — SODIUM CHLORIDE 0.9 % WEIGHT BASED INFUSION
3.0000 mL/kg/h | INTRAVENOUS | Status: AC
  Administered 2020-10-27: 3 mL/kg/h via INTRAVENOUS

## 2020-10-27 MED ORDER — FENTANYL CITRATE (PF) 100 MCG/2ML IJ SOLN
INTRAMUSCULAR | Status: DC | PRN
  Administered 2020-10-27 (×2): 25 ug via INTRAVENOUS

## 2020-10-27 MED ORDER — HEPARIN (PORCINE) IN NACL 1000-0.9 UT/500ML-% IV SOLN
INTRAVENOUS | Status: AC
  Filled 2020-10-27: qty 1000

## 2020-10-27 MED ORDER — LIDOCAINE HCL 1 % IJ SOLN
INTRAMUSCULAR | Status: AC
  Filled 2020-10-27: qty 20

## 2020-10-27 MED ORDER — VERAPAMIL HCL 2.5 MG/ML IV SOLN
INTRAVENOUS | Status: DC | PRN
  Administered 2020-10-27: 2.5 mg via INTRA_ARTERIAL

## 2020-10-27 MED ORDER — ASPIRIN 81 MG PO CHEW: 81.0000 mg | CHEWABLE_TABLET | ORAL | Status: AC

## 2020-10-27 MED ORDER — HEPARIN SODIUM (PORCINE) 1000 UNIT/ML IJ SOLN
INTRAMUSCULAR | Status: DC | PRN
  Administered 2020-10-27: 4500 [IU] via INTRAVENOUS

## 2020-10-27 MED ORDER — HEPARIN SODIUM (PORCINE) 1000 UNIT/ML IJ SOLN
INTRAMUSCULAR | Status: AC
  Filled 2020-10-27: qty 1

## 2020-10-27 MED ORDER — LABETALOL HCL 5 MG/ML IV SOLN: 10.0000 mg | INTRAVENOUS | Status: DC | PRN

## 2020-10-27 MED ORDER — HEPARIN (PORCINE) IN NACL 2000-0.9 UNIT/L-% IV SOLN
INTRAVENOUS | Status: DC | PRN
  Administered 2020-10-27: 1000 mL

## 2020-10-27 MED ORDER — ASPIRIN 81 MG PO CHEW
CHEWABLE_TABLET | ORAL | Status: AC
  Administered 2020-10-27: 81 mg via ORAL
  Filled 2020-10-27: qty 1

## 2020-10-27 SURGICAL SUPPLY — 13 items
CATH INFINITI 5 FR JL3.5 (CATHETERS) ×2 IMPLANT
CATH INFINITI JR4 5F (CATHETERS) ×2 IMPLANT
DEVICE RAD TR BAND REGULAR (VASCULAR PRODUCTS) ×2 IMPLANT
DRAPE BRACHIAL (DRAPES) ×2 IMPLANT
GLIDESHEATH SLEND SS 6F .021 (SHEATH) ×2 IMPLANT
GUIDEWIRE INQWIRE 1.5J.035X260 (WIRE) ×1 IMPLANT
INQWIRE 1.5J .035X260CM (WIRE) ×2
PACK CARDIAC CATH (CUSTOM PROCEDURE TRAY) ×2 IMPLANT
PROTECTION STATION PRESSURIZED (MISCELLANEOUS) ×2
SET ATX SIMPLICITY (MISCELLANEOUS) ×2 IMPLANT
STATION PROTECTION PRESSURIZED (MISCELLANEOUS) ×1 IMPLANT
TUBING CIL FLEX 10 FLL-RA (TUBING) ×2 IMPLANT
WIRE NITINOL .018 (WIRE) ×2 IMPLANT

## 2020-10-29 ENCOUNTER — Encounter: Payer: Self-pay | Admitting: Internal Medicine

## 2020-11-06 DIAGNOSIS — E782 Mixed hyperlipidemia: Secondary | ICD-10-CM | POA: Diagnosis not present

## 2020-11-06 DIAGNOSIS — Z955 Presence of coronary angioplasty implant and graft: Secondary | ICD-10-CM | POA: Diagnosis not present

## 2020-11-06 DIAGNOSIS — I2510 Atherosclerotic heart disease of native coronary artery without angina pectoris: Secondary | ICD-10-CM | POA: Diagnosis not present

## 2020-11-06 DIAGNOSIS — R0609 Other forms of dyspnea: Secondary | ICD-10-CM | POA: Diagnosis not present

## 2020-11-06 DIAGNOSIS — I10 Essential (primary) hypertension: Secondary | ICD-10-CM | POA: Diagnosis not present

## 2020-11-06 DIAGNOSIS — R600 Localized edema: Secondary | ICD-10-CM | POA: Diagnosis not present

## 2020-11-06 DIAGNOSIS — E119 Type 2 diabetes mellitus without complications: Secondary | ICD-10-CM | POA: Diagnosis not present

## 2020-11-06 DIAGNOSIS — N1831 Chronic kidney disease, stage 3a: Secondary | ICD-10-CM | POA: Diagnosis not present

## 2020-11-06 DIAGNOSIS — Z8673 Personal history of transient ischemic attack (TIA), and cerebral infarction without residual deficits: Secondary | ICD-10-CM | POA: Diagnosis not present

## 2021-01-19 DIAGNOSIS — E119 Type 2 diabetes mellitus without complications: Secondary | ICD-10-CM | POA: Diagnosis not present

## 2021-01-19 DIAGNOSIS — K744 Secondary biliary cirrhosis: Secondary | ICD-10-CM | POA: Diagnosis not present

## 2021-01-19 DIAGNOSIS — I129 Hypertensive chronic kidney disease with stage 1 through stage 4 chronic kidney disease, or unspecified chronic kidney disease: Secondary | ICD-10-CM | POA: Diagnosis not present

## 2021-01-19 DIAGNOSIS — E1122 Type 2 diabetes mellitus with diabetic chronic kidney disease: Secondary | ICD-10-CM | POA: Diagnosis not present

## 2021-01-19 DIAGNOSIS — I272 Pulmonary hypertension, unspecified: Secondary | ICD-10-CM | POA: Diagnosis not present

## 2021-01-19 DIAGNOSIS — I2510 Atherosclerotic heart disease of native coronary artery without angina pectoris: Secondary | ICD-10-CM | POA: Diagnosis not present

## 2021-01-19 DIAGNOSIS — E890 Postprocedural hypothyroidism: Secondary | ICD-10-CM | POA: Diagnosis not present

## 2021-01-19 DIAGNOSIS — M109 Gout, unspecified: Secondary | ICD-10-CM | POA: Diagnosis not present

## 2021-01-19 DIAGNOSIS — Z23 Encounter for immunization: Secondary | ICD-10-CM | POA: Diagnosis not present

## 2021-03-24 ENCOUNTER — Other Ambulatory Visit: Payer: Self-pay | Admitting: Internal Medicine

## 2021-03-24 DIAGNOSIS — K744 Secondary biliary cirrhosis: Secondary | ICD-10-CM

## 2021-03-24 DIAGNOSIS — I10 Essential (primary) hypertension: Secondary | ICD-10-CM

## 2021-04-06 ENCOUNTER — Ambulatory Visit
Admission: RE | Admit: 2021-04-06 | Discharge: 2021-04-06 | Disposition: A | Discharge status: Home / Self Care | Payer: Medicare HMO | Source: Ambulatory Visit | Attending: Internal Medicine | Admitting: Internal Medicine

## 2021-04-06 ENCOUNTER — Other Ambulatory Visit: Payer: Self-pay

## 2021-04-06 DIAGNOSIS — K744 Secondary biliary cirrhosis: Secondary | ICD-10-CM | POA: Insufficient documentation

## 2021-04-06 DIAGNOSIS — I10 Essential (primary) hypertension: Secondary | ICD-10-CM | POA: Insufficient documentation

## 2021-07-18 ENCOUNTER — Emergency Department
Admission: EM | Admit: 2021-07-18 | Discharge: 2021-07-18 | Disposition: A | Discharge status: Home / Self Care | Payer: Medicare HMO | Attending: Emergency Medicine | Admitting: Emergency Medicine

## 2021-07-18 ENCOUNTER — Emergency Department: Payer: Medicare HMO

## 2021-07-18 ENCOUNTER — Other Ambulatory Visit: Payer: Self-pay

## 2021-07-18 DIAGNOSIS — W010XXA Fall on same level from slipping, tripping and stumbling without subsequent striking against object, initial encounter: Secondary | ICD-10-CM | POA: Insufficient documentation

## 2021-07-18 DIAGNOSIS — R600 Localized edema: Secondary | ICD-10-CM | POA: Insufficient documentation

## 2021-07-18 DIAGNOSIS — W19XXXA Unspecified fall, initial encounter: Secondary | ICD-10-CM

## 2021-07-18 DIAGNOSIS — M79652 Pain in left thigh: Secondary | ICD-10-CM | POA: Diagnosis not present

## 2021-07-18 DIAGNOSIS — S8012XA Contusion of left lower leg, initial encounter: Secondary | ICD-10-CM | POA: Insufficient documentation

## 2021-07-18 DIAGNOSIS — I10 Essential (primary) hypertension: Secondary | ICD-10-CM | POA: Insufficient documentation

## 2021-07-18 DIAGNOSIS — S8992XA Unspecified injury of left lower leg, initial encounter: Secondary | ICD-10-CM | POA: Diagnosis present

## 2021-07-18 DIAGNOSIS — E119 Type 2 diabetes mellitus without complications: Secondary | ICD-10-CM | POA: Diagnosis not present

## 2021-07-18 DIAGNOSIS — M25552 Pain in left hip: Secondary | ICD-10-CM | POA: Diagnosis not present

## 2021-07-18 MED ORDER — OXYCODONE HCL 5 MG PO TABS: 5.0000 mg | ORAL_TABLET | Freq: Four times a day (QID) | ORAL | 0 refills | Status: DC | PRN

## 2021-07-18 NOTE — ED Provider Notes (Signed)
[facility] Provider Note    Event Date/Time   First MD Initiated Contact with Patient 07/18/21 1251     (approximate)   History   Leg Injury   HPI  Jesse Thompson is a 71 y.o. male   presents to the ED with complaint of left lower extremity pain when he tripped over a cord 2 weeks ago and fell.  Patient states he has not been seen for this prior to this ED visit.  Patient states that gradually the pain has increased and now is painful to bear weight.  He denies any previous injury or surgeries to his left lower extremity.  Patient has a history of hypertension, hyperlipidemia, gout, GERD, diabetes type 2, non-STEMI MI and collagen vascular disease.  Wife states that he is supposed to take Lasix 3 times a day for fluid in his legs which he does not take especially if he is going somewhere or driving.  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      Physical Exam   Triage Vital Signs: ED Triage Vitals  Enc Vitals Group     BP 07/18/21 1200 (!) 163/83     Pulse Rate 07/18/21 1200 (!) 56     Resp 07/18/21 1200 18     Temp 07/18/21 1200 98.2 F (36.8 C)     Temp Source 07/18/21 1200 Oral     SpO2 07/18/21 1200 99 %     Weight 07/18/21 1157 180 lb (81.6 kg)     Height 07/18/21 1157 '5\' 6"'$  (1.676 m)     Head Circumference --      Peak Flow --      Pain Score 07/18/21 1157 10     Pain Loc --      Pain Edu? --      Excl. in [place]? --     Most recent vital signs: Vitals:   07/18/21 1200  BP: (!) 163/83  Pulse: (!) 56  Resp: 18  Temp: 98.2 F (36.8 C)  SpO2: 99%     General: Awake, no distress.  CV:  Good peripheral perfusion.  Resp:  Normal effort.  Abd:  No distention.  Other:  Examination of the left lower extremity there is no gross deformity however there is generalized tenderness on palpation of the left hip, left thigh, left knee and left tib-fib.  No discoloration and skin is intact.  Patient is able to slowly flex and extend.  There is chronic mild  pitting edema noted bilateral lower extremities.    ED Results / Procedures / Treatments   Labs (all labs ordered are listed, but only abnormal results are displayed) Labs Reviewed - No data to display    RADIOLOGY  Pelvis x-ray per radiologist is negative for acute fracture.  Degenerative disc disease is noted on lumbar spine. Left knee x-rays per radiologist no fracture or dislocation.Mild tricompartmental degenerative change with effusion. Left femur x-ray per radiologist is negative for acute bony injury.  Degenerative disease noted.  Ultrasound left lower extremity (DVT) was negative for evidence of DVT.  PROCEDURES:  Critical Care performed:   Procedures   MEDICATIONS ORDERED IN ED: Medications - No data to display   IMPRESSION / MDM / [place] / ED COURSE  I reviewed the triage vital signs and the nursing notes.   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  72 year old male presents to the ED with complaint of continued left lower extremity pain since falling when he tripped on a cord 2 weeks ago.  Patient states that pain has increased and weightbearing is more painful at times.  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  X-rays of the pelvis, femur, tib-fib were negative for acute bony injury or dislocation.  Ultrasound left lower extremity ruled out DVT.  Patient was made aware that he does have degenerative changes in multiple areas including his low back which is complicating his movement and weightbearing but no fractures were noted.  There was some mild effusion noted to the left knee however with patient's edema it was felt that a knee immobilizer would only complicate this.  Patient was  given a prescription for oxycodone IR 5 mg as the pharmacy that he uses is out of most controlled substances for pain.  Patient is aware that he cannot drive or operate machinery while taking this medication as it could cause drowsiness and increase his risk for injury.  He is to follow-up with his PCP if any continued problems or not improving.      Patient's presentation is most consistent with acute complicated illness / injury requiring diagnostic workup.  FINAL CLINICAL IMPRESSION(S) / ED DIAGNOSES   Final diagnoses:  Contusion of left lower extremity, initial encounter  Edema of left lower leg  Fall, initial encounter     Rx / DC Orders   ED Discharge Orders          Ordered    oxyCODONE (OXY IR/ROXICODONE) 5 MG immediate release tablet  Every 6 hours PRN        07/18/21 1423             Note:  This document was prepared using Dragon voice recognition software and may include unintentional dictation errors.   Johnn Hai, PA-C 07/18/21 1514    Carrie Mew, MD 07/19/21 1929

## 2021-07-18 NOTE — Discharge Instructions (Signed)
Follow-up with your primary care provider if any continued problems.  A prescription for pain medication was sent to your pharmacy to take every 6 hours as needed for pain.  Do not drive or operate machinery while taking this medication as it could cause drowsiness and increase your risk for falling or injury.  Follow-up with your primary care provider as needed.

## 2021-07-18 NOTE — ED Triage Notes (Signed)
Pt states he tripped and fell over a cord 2 weeks ago, pt states he was not seen afterwards but pain to left leg has increased and pt states he can hardly put weight on it at this time. Pt denies redness, pt states LE are normally swollen in ankles. 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

## 2021-08-26 ENCOUNTER — Other Ambulatory Visit: Payer: Self-pay | Admitting: Student

## 2021-08-26 DIAGNOSIS — M79605 Pain in left leg: Secondary | ICD-10-CM

## 2021-08-26 DIAGNOSIS — M47816 Spondylosis without myelopathy or radiculopathy, lumbar region: Secondary | ICD-10-CM

## 2021-08-26 DIAGNOSIS — M5416 Radiculopathy, lumbar region: Secondary | ICD-10-CM

## 2021-09-05 ENCOUNTER — Ambulatory Visit
Admission: RE | Admit: 2021-09-05 | Discharge: 2021-09-05 | Disposition: A | Discharge status: Home / Self Care | Payer: Medicare Other | Source: Ambulatory Visit | Attending: Student | Admitting: Student

## 2021-09-05 DIAGNOSIS — M5416 Radiculopathy, lumbar region: Secondary | ICD-10-CM | POA: Insufficient documentation

## 2021-09-05 DIAGNOSIS — M47816 Spondylosis without myelopathy or radiculopathy, lumbar region: Secondary | ICD-10-CM | POA: Diagnosis present

## 2021-09-05 DIAGNOSIS — M79605 Pain in left leg: Secondary | ICD-10-CM | POA: Diagnosis present

## 2021-09-16 ENCOUNTER — Other Ambulatory Visit: Payer: Self-pay | Admitting: Internal Medicine

## 2021-09-16 DIAGNOSIS — R935 Abnormal findings on diagnostic imaging of other abdominal regions, including retroperitoneum: Secondary | ICD-10-CM

## 2021-09-16 DIAGNOSIS — K689 Other disorders of retroperitoneum: Secondary | ICD-10-CM

## 2021-09-22 ENCOUNTER — Ambulatory Visit
Admission: RE | Admit: 2021-09-22 | Discharge: 2021-09-22 | Disposition: A | Discharge status: Home / Self Care | Payer: Medicare Other | Source: Ambulatory Visit | Attending: Internal Medicine | Admitting: Internal Medicine

## 2021-09-22 DIAGNOSIS — R935 Abnormal findings on diagnostic imaging of other abdominal regions, including retroperitoneum: Secondary | ICD-10-CM

## 2021-09-22 DIAGNOSIS — K689 Other disorders of retroperitoneum: Secondary | ICD-10-CM

## 2021-09-22 MED ORDER — IOPAMIDOL (ISOVUE-300) INJECTION 61%
100.0000 mL | Freq: Once | INTRAVENOUS | Status: AC | PRN
  Administered 2021-09-22: 100 mL via INTRAVENOUS

## 2021-10-13 ENCOUNTER — Other Ambulatory Visit: Payer: Self-pay | Admitting: Gastroenterology

## 2021-10-13 ENCOUNTER — Other Ambulatory Visit (HOSPITAL_COMMUNITY): Payer: Self-pay | Admitting: Gastroenterology

## 2021-10-13 DIAGNOSIS — K8301 Primary sclerosing cholangitis: Secondary | ICD-10-CM

## 2021-10-21 ENCOUNTER — Ambulatory Visit
Admission: RE | Admit: 2021-10-21 | Discharge: 2021-10-21 | Disposition: A | Discharge status: Home / Self Care | Payer: Medicare Other | Source: Ambulatory Visit | Attending: Gastroenterology | Admitting: Gastroenterology

## 2021-10-21 DIAGNOSIS — K8301 Primary sclerosing cholangitis: Secondary | ICD-10-CM | POA: Insufficient documentation

## 2021-10-21 MED ORDER — GADOBUTROL 1 MMOL/ML IV SOLN
7.5000 mL | Freq: Once | INTRAVENOUS | Status: AC | PRN
Start: 2021-10-21 — End: 2021-10-21
  Administered 2021-10-21: 7.5 mL via INTRAVENOUS

## 2021-11-25 ENCOUNTER — Ambulatory Visit
Admission: RE | Admit: 2021-11-25 | Discharge: 2021-11-25 | Disposition: A | Discharge status: Home / Self Care | Payer: Self-pay | Source: Ambulatory Visit | Attending: Neurosurgery | Admitting: Neurosurgery

## 2021-11-25 ENCOUNTER — Other Ambulatory Visit: Payer: Self-pay

## 2021-11-25 DIAGNOSIS — Z049 Encounter for examination and observation for unspecified reason: Secondary | ICD-10-CM

## 2021-12-02 NOTE — Progress Notes (Unsigned)
Referring Physician:  Harvest Dark, [address] [place],  [place] [id number]  Primary Physician:  Adin Hector, MD  History of Present Illness: 12/03/2021 Mr. Jesse Thompson is here today with a chief complaint of  low back pain that radiates into the left buttock, lateral thigh and lateral calf with associated numbness of the foot.  He has been having pain for 3 months.  He reports tingling and numbness that goes down his left leg on the lateral aspect of his thigh and lower leg prickly when he stands or walks.  He gets pain as bad as 10 out of 10.  Nothing really helps.  He also has significant back pain.   Bowel/Bladder Dysfunction: none  Conservative measures:  Physical therapy: has participated in 2 visits at [facility] on 11/09/21 and 11/18/21 but it was making the pain worse.  Multimodal medical therapy including regular antiinflammatories: gabapentin, meloxicam,  Injections: has received epidural steroid injections 10/28/2021: Left L4-5 and left L5-S1 transforaminal ESI (no relief, dexamethasone 12 mg) 09/23/2021: Left L5-S1 transforaminal ESI (4 hours of relief)  Past Surgery: denies  [name] has no symptoms of cervical myelopathy.  The symptoms are causing a significant impact on the patient's life.   Review of Systems:  A 10 point review of systems is negative, except for the pertinent positives and negatives detailed in the HPI.  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   Vertigo     Past Surgical History: Past Surgical History:  Procedure Laterality Date   BILE DUCT STENT PLACEMENT  2015   now out    [place] Right 09/15/2016   Procedure: CARPAL TUNNEL RELEASE;  Surgeon: Earnestine Leys, MD;  Location: [facility] ORS;  Service: Orthopedics;  Laterality: Right;   CARPAL TUNNEL RELEASE Left 09/29/2016   Procedure: CARPAL TUNNEL RELEASE;  Surgeon: Earnestine Leys, MD;  Location: [facility] ORS;  Service: Orthopedics;  Laterality: Left;  sutures removed right hand from status post carpal tunnel   CORONARY STENT INTERVENTION N/A 09/13/2019   Procedure: CORONARY STENT INTERVENTION;  Surgeon: Yolonda Kida, MD;  Location: [place] [facility];  Service: Cardiovascular;  Laterality: N/A;  LAD   FRACTURE SURGERY Right    Ankle   LEFT HEART CATH AND CORONARY ANGIOGRAPHY Left 10/27/2020   Procedure: LEFT HEART CATH AND CORONARY ANGIOGRAPHY;  Surgeon: Yolonda Kida, MD;  Location: [place] [facility];  Service: Cardiovascular;  Laterality: Left;   RIGHT/LEFT HEART CATH AND CORONARY ANGIOGRAPHY N/A 09/13/2019   Procedure: RIGHT/LEFT HEART CATH AND CORONARY ANGIOGRAPHY;  Surgeon: Yolonda Kida, MD;  Location: [place] [facility];  Service: Cardiovascular;  Laterality: N/A;   TOTAL THYROIDECTOMY      Allergies: Allergies as of 12/03/2021 - Review Complete 12/03/2021  Allergen Reaction Noted   Metronidazole Itching 12/26/2012   Penicillin g Rash 12/26/2012    Medications: No outpatient medications have been marked as taking for the 12/03/21 encounter (Office Visit) with Meade Maw, MD.    Social History: Social History   Tobacco Use   Smoking status: Never  Smokeless tobacco: Never  Vaping Use   Vaping Use: Never used  Substance Use Topics   Alcohol use: No    Alcohol/week: 0.0 standard drinks of alcohol   Drug use: No    Family Medical History: Family History  Problem Relation Age of Onset   Hypertension Mother    Hypertension Father     Physical Examination: Vitals:   12/03/21 1300  BP: 136/72    General: Patient is well developed, well nourished, calm,  collected, and in no apparent distress. Attention to examination is appropriate.  Neck:   Supple.  Full range of motion.  Respiratory: Patient is breathing without any difficulty.   NEUROLOGICAL:     Awake, alert, oriented to person, place, and time.  Speech is clear and fluent. Fund of knowledge is appropriate.   Cranial Nerves: Pupils equal round and reactive to light.  Facial tone is symmetric.  Facial sensation is symmetric. Shoulder shrug is symmetric. Tongue protrusion is midline.  There is no pronator drift.  ROM of spine: full.    Strength: Side Biceps Triceps Deltoid Interossei Grip Wrist Ext. Wrist Flex.  R '5 5 5 5 5 5 5  '$ L '5 5 5 5 5 5 5   '$ Side Iliopsoas Quads Hamstring PF DF EHL  R '5 5 5 5 5 5  '$ L '5 5 5 5 5 5   '$ Reflexes are 2+ and symmetric at the biceps, triceps, brachioradialis, patella and achilles.   Hoffman's is absent.   Bilateral upper and lower extremity sensation is intact to light touch.    No evidence of dysmetria noted.  Gait is normal.     Medical Decision Making  Imaging: MRI L spine 09/05/21 Disc levels:   T12- L1: Unremarkable.   L1-L2: Disc narrowing and bulging.  Negative facets.   L2-L3: Disc narrowing and far-lateral predominant bulging.   L3-L4: Disc narrowing and bulging with far-lateral predominant spurring. Right paracentral protrusion with mild mass effect on the right L4 nerve root   L4-L5: Degenerative facet spurring with anterolisthesis. The disc is narrowed and bulging with moderate spinal stenosis. Patent foramina.   L5-S1:Mild disc desiccation.   These results will be called to the ordering clinician or representative by the Radiologist Assistant, and communication documented in the PACS or [employer].   IMPRESSION: 1. Right retroperitoneal edema which may be related to bowel, please correlate with abdominal symptoms and consider abdominal CT. 2. Lumbar spine degeneration most notably at the L4-5 facets  where there is marrow edema and anterolisthesis causing moderate spinal stenosis.     Electronically Signed   By: Jorje Guild M.D.   On: 09/07/2021 13:33  I have personally reviewed the images and agree with the above interpretation.  Assessment and Plan: Jesse Thompson is a pleasant 71 y.o. male with anterolisthesis of L4 and L5 with 9 mm on standing x-rays with only 3 mm on supine MRI scan.  This implies lumbar spinal instability.  He has severe facet arthrosis at L4-5 and at least moderate stenosis when he is laying down.  He has seen physical therapy twice but not been cleared from physical therapy.  I will send him back for physical therapy reevaluation.  If he fails physical therapy, he will ultimately be a candidate for L4-5 lateral lumbar interbody fusion with percutaneous fixation to address his anterolisthesis, instability, and moderate to severe stenosis.   I spent a total of 30 minutes in face-to-face and non-face-to-face activities related to this patient's care  today.  Thank you for involving me in the care of this patient.      Laymon Stockert K. Izora Ribas MD, [facility] Neurosurgery

## 2021-12-03 ENCOUNTER — Encounter: Payer: Self-pay | Admitting: Neurosurgery

## 2021-12-03 ENCOUNTER — Ambulatory Visit: Payer: Medicare Other | Admitting: Neurosurgery

## 2021-12-03 VITALS — BP 136/72 | Ht 66.0 in | Wt 188.0 lb

## 2021-12-03 DIAGNOSIS — M5442 Lumbago with sciatica, left side: Secondary | ICD-10-CM | POA: Diagnosis not present

## 2021-12-03 DIAGNOSIS — M532X6 Spinal instabilities, lumbar region: Secondary | ICD-10-CM | POA: Diagnosis not present

## 2021-12-03 DIAGNOSIS — G8929 Other chronic pain: Secondary | ICD-10-CM

## 2021-12-03 DIAGNOSIS — M4316 Spondylolisthesis, lumbar region: Secondary | ICD-10-CM | POA: Diagnosis not present

## 2021-12-03 DIAGNOSIS — M5416 Radiculopathy, lumbar region: Secondary | ICD-10-CM

## 2021-12-10 IMAGING — CT CT HEAD W/O CM
4 series · 15 of 47 positions shown, 17 images · non-contrast
Comparison: CT head dated July 21, 2019.

CLINICAL DATA: Fall.

EXAM:
CT HEAD WITHOUT CONTRAST
CT MAXILLOFACIAL WITHOUT CONTRAST
CT CERVICAL SPINE WITHOUT CONTRAST
TECHNIQUE: Multidetector CT imaging of the head, cervical spine, and
maxillofacial structures were performed using the standard protocol
without intravenous contrast. Multiplanar CT image reconstructions
of the cervical spine and maxillofacial structures were also
generated.

[Series 2: head wo · axial · 0.42mm/px · z∈[+213,+323]mm · 7 of 30 slices shown, 9 images]
[im 4/30  brain]
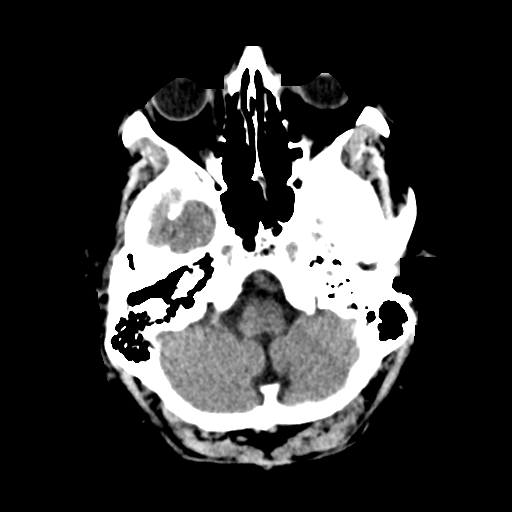
[im 4/30  bone]
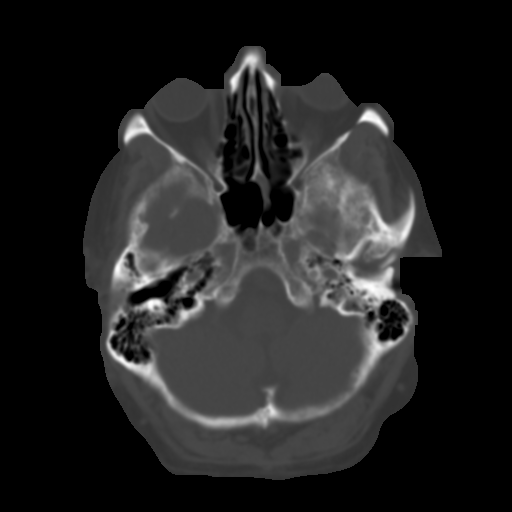
[im 8/30  brain]
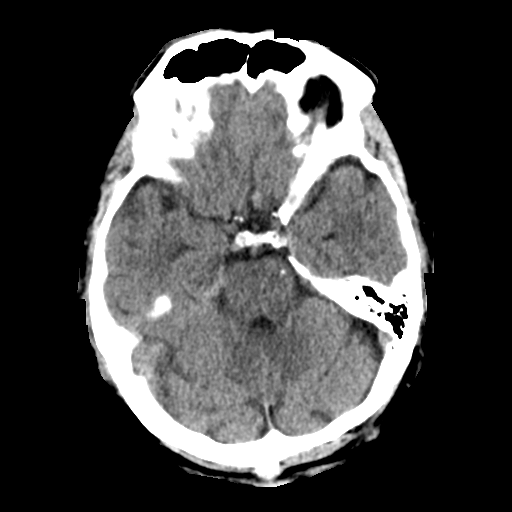
[im 11/30  brain]
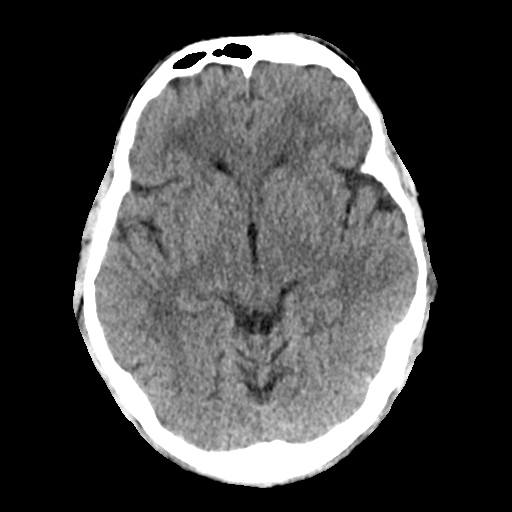
[im 15/30  brain]
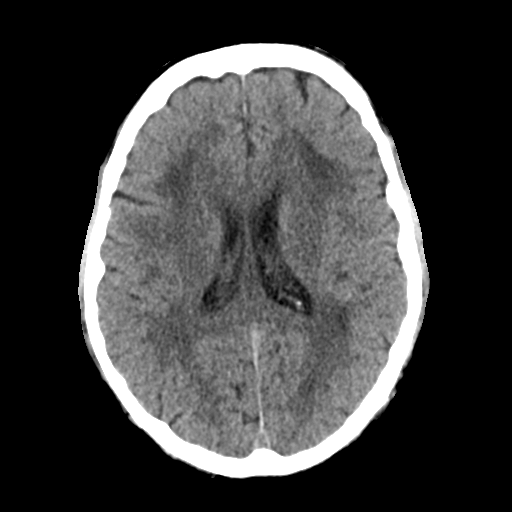
[im 19/30  brain]
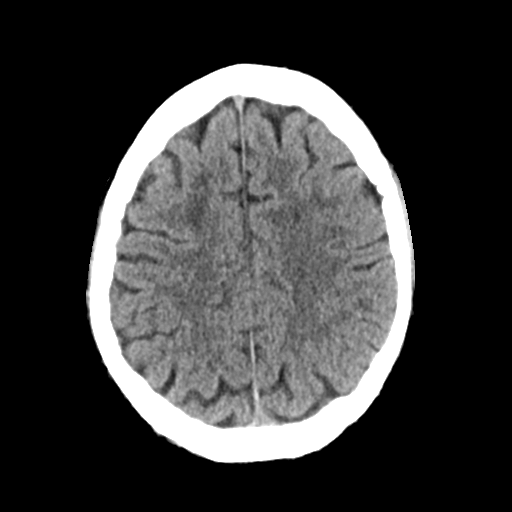
[im 19/30  bone]
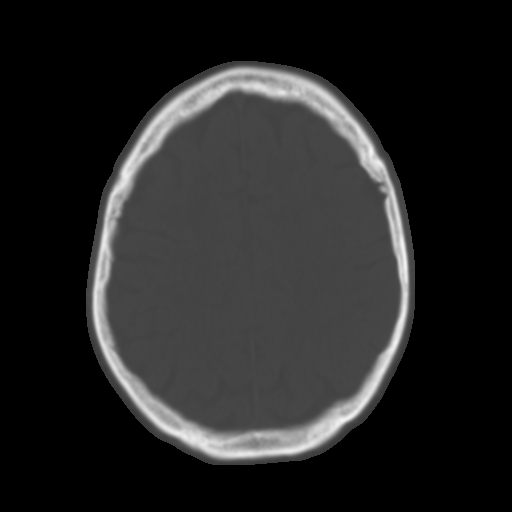
[im 22/30  brain]
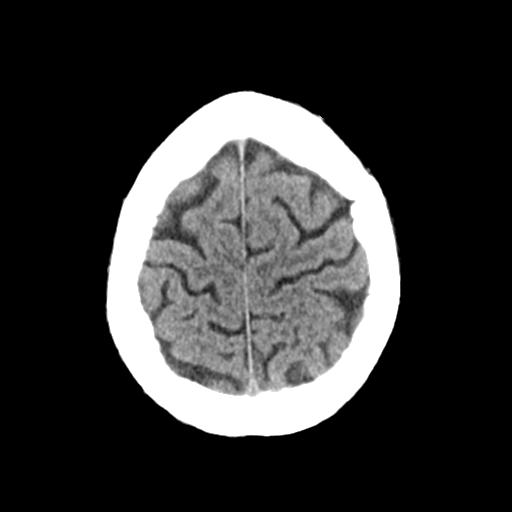
[im 26/30  brain]
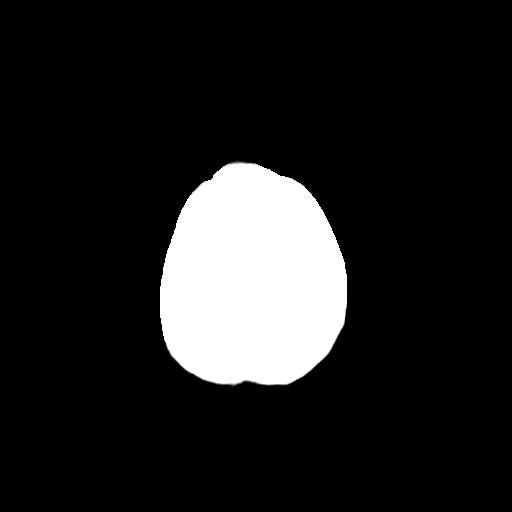

[Series 3: head bone · axial · 0.42mm/px · z∈[+212,+226]mm · 2 of 75 slices shown]
[im 8/75  bone]
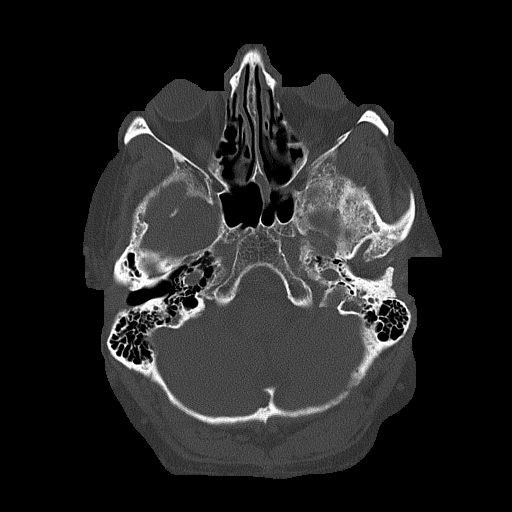
[im 15/75  bone]
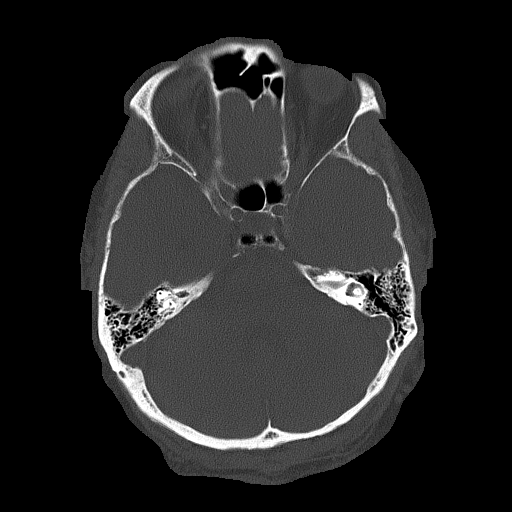

[Series 4: coronal soft tissue · coronal · 0.29mm/px · 3 of 64 slices shown]
[im 22/64  brain]
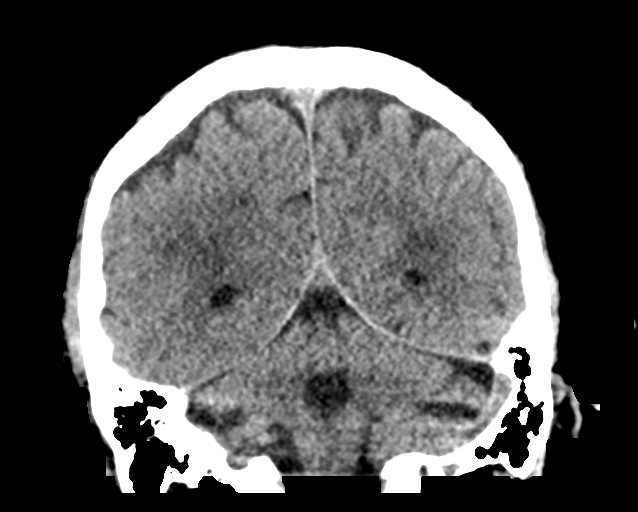
[im 29/64  brain]
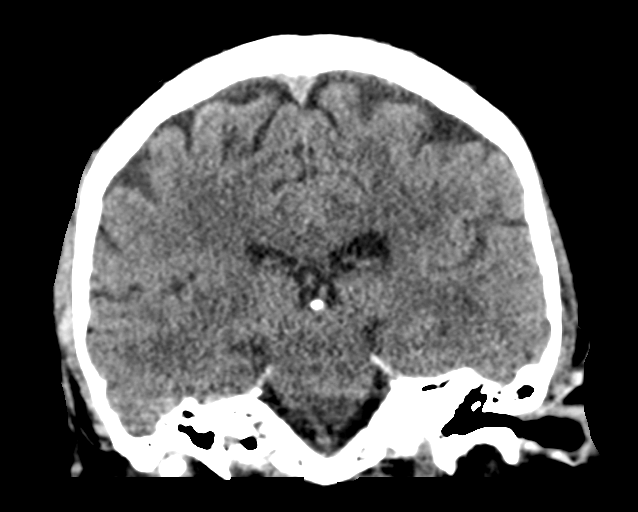
[im 36/64  brain]
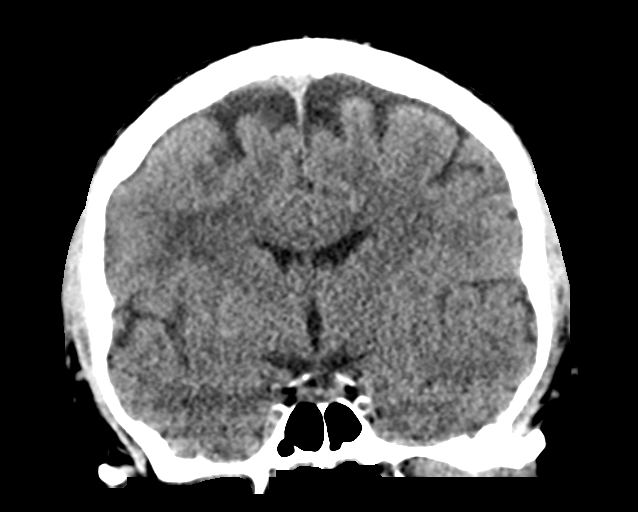

[Series 5: sagittal soft tissue · sagittal · 0.29mm/px · 3 of 57 slices shown]
[im 19/57  brain]
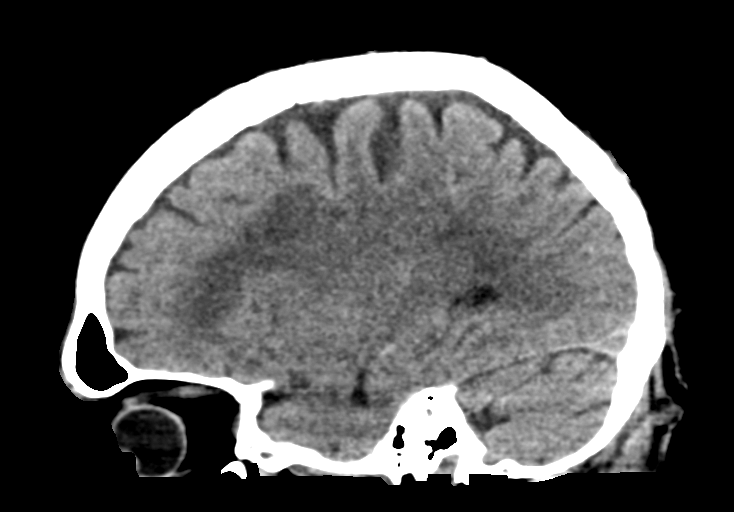
[im 29/57  brain]
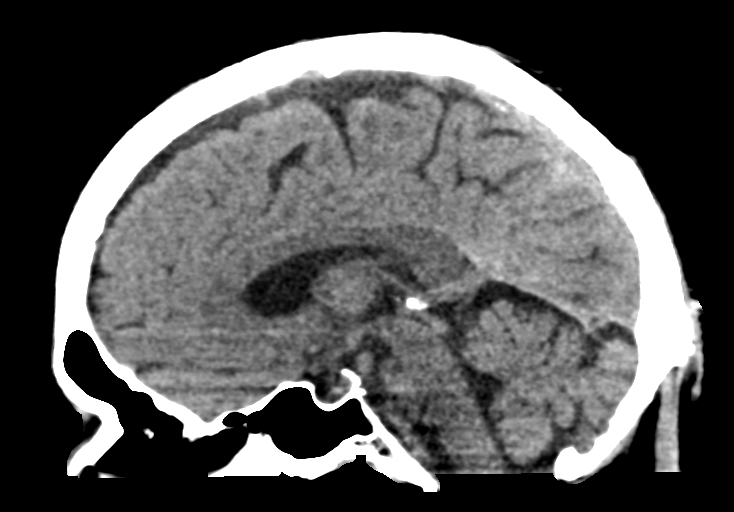
[im 38/57  brain]
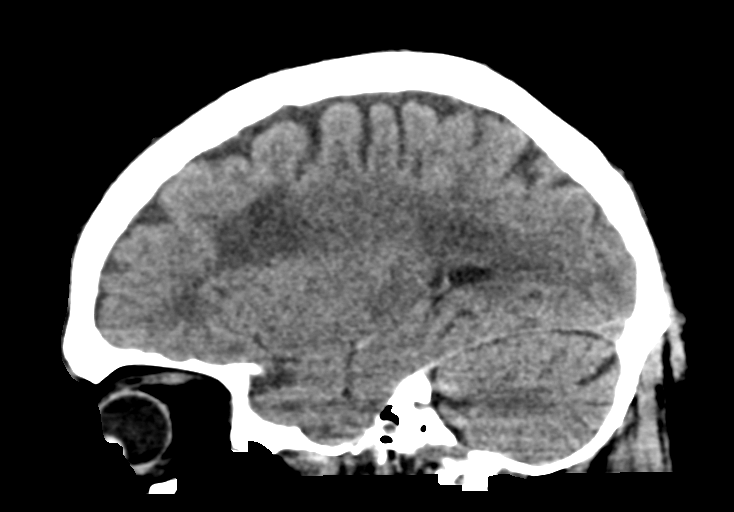

[15 of 47 positions shown; findings below may reference images not displayed]

FINDINGS: CT HEAD FINDINGS

Brain: No evidence of acute infarction, hemorrhage, hydrocephalus,
extra-axial collection or mass lesion/mass effect. Stable moderate
chronic microvascular ischemic changes.

Vascular: Calcified atherosclerosis at the skullbase. No hyperdense
vessel.

Skull: Normal. Negative for fracture or focal lesion.

Other: None.

CT MAXILLOFACIAL FINDINGS

Osseous: No fracture or mandibular dislocation. No destructive
process. Bilateral TMJ osteoarthritis.

Orbits: Negative. No traumatic or inflammatory finding.

Sinuses: No acute finding.

Soft tissues: Chin soft tissue laceration right of midline.

CT CERVICAL SPINE FINDINGS

Alignment: No traumatic malalignment. Trace anterolisthesis from
C5-C6 through C7-T1.

Skull base and vertebrae: No acute fracture. No primary bone lesion
or focal pathologic process.

Soft tissues and spinal canal: No prevertebral fluid or swelling. No
visible canal hematoma.

Disc levels: Disc heights are relatively preserved. Scattered mild
facet arthropathy.

Upper chest: Negative.

Other: None.
IMPRESSION: 1. No acute intracranial abnormality. Stable moderate chronic
microvascular ischemic changes.
2. No acute maxillofacial fracture. Chin soft tissue laceration
right of midline.
3. No acute cervical spine fracture or traumatic listhesis.

## 2021-12-14 ENCOUNTER — Telehealth: Payer: Self-pay

## 2021-12-14 NOTE — Telephone Encounter (Signed)
He confirmed telephone visit for 12/16/21.

## 2021-12-14 NOTE — Telephone Encounter (Signed)
-----   Message from Peggyann Shoals sent at 12/14/2021  3:11 PM EST ----- Regarding: Parke Simmers from [facility] is calling that she seen patient on 10/31 and is adding an addendum to her office note to discharge patient so he can move forward with surgery. Olivia Mackie said if you need to speak directly to her please call the office [phone].

## 2021-12-16 ENCOUNTER — Ambulatory Visit (INDEPENDENT_AMBULATORY_CARE_PROVIDER_SITE_OTHER): Payer: Medicare Other | Admitting: Neurosurgery

## 2021-12-16 ENCOUNTER — Telehealth: Payer: Self-pay

## 2021-12-16 ENCOUNTER — Other Ambulatory Visit: Payer: Self-pay

## 2021-12-16 DIAGNOSIS — Z01818 Encounter for other preprocedural examination: Secondary | ICD-10-CM

## 2021-12-16 DIAGNOSIS — M532X6 Spinal instabilities, lumbar region: Secondary | ICD-10-CM | POA: Diagnosis not present

## 2021-12-16 DIAGNOSIS — M5442 Lumbago with sciatica, left side: Secondary | ICD-10-CM | POA: Diagnosis not present

## 2021-12-16 DIAGNOSIS — G8929 Other chronic pain: Secondary | ICD-10-CM | POA: Diagnosis not present

## 2021-12-16 DIAGNOSIS — M4316 Spondylolisthesis, lumbar region: Secondary | ICD-10-CM

## 2021-12-16 NOTE — Progress Notes (Signed)
Referring Physician:  Adin Hector, MD [facility] [place],  [place] [id number]  Primary Physician:  Adin Hector, MD  History of Present Illness: 12/16/2021 Jesse Thompson returns to discuss his lack of improvement with physical therapy.  He was recently discharged.  12/03/2021 Jesse Thompson is here today with a chief complaint of  low back pain that radiates into the left buttock, lateral thigh and lateral calf with associated numbness of the foot.  He has been having pain for 3 months.  He reports tingling and numbness that goes down his left leg on the lateral aspect of his thigh and lower leg prickly when he stands or walks.  He gets pain as bad as 10 out of 10.  Nothing really helps.  He also has significant back pain.   Bowel/Bladder Dysfunction: none  Conservative measures:  Physical therapy: has participated in 2 visits at [facility] on 11/09/21 and 11/18/21 but it was making the pain worse.  Multimodal medical therapy including regular antiinflammatories: gabapentin, meloxicam,  Injections: has received epidural steroid injections 10/28/2021: Left L4-5 and left L5-S1 transforaminal ESI (no relief, dexamethasone 12 mg) 09/23/2021: Left L5-S1 transforaminal ESI (4 hours of relief)  Past Surgery: denies  Jesse Thompson has no symptoms of cervical myelopathy.  The symptoms are causing a significant impact on the patient's life.   Review of Systems:  A 10 point review of systems is negative, except for the pertinent positives and negatives detailed in the HPI.  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    Vertigo     Past Surgical History: Past Surgical History:  Procedure Laterality Date   BILE DUCT STENT PLACEMENT  2015   now out   [place] Right 09/15/2016   Procedure: CARPAL TUNNEL RELEASE;  Surgeon: Earnestine Leys, MD;  Location: [facility] ORS;  Service: Orthopedics;  Laterality: Right;   CARPAL TUNNEL RELEASE Left 09/29/2016   Procedure: CARPAL TUNNEL RELEASE;  Surgeon: Earnestine Leys, MD;  Location: [facility] ORS;  Service: Orthopedics;  Laterality: Left;  sutures removed right hand from status post carpal tunnel   CORONARY STENT INTERVENTION N/A 09/13/2019   Procedure: CORONARY STENT INTERVENTION;  Surgeon: Yolonda Kida, MD;  Location: [place] [facility];  Service: Cardiovascular;  Laterality: N/A;  LAD   FRACTURE SURGERY Right    Ankle   LEFT HEART CATH AND CORONARY ANGIOGRAPHY Left 10/27/2020   Procedure: LEFT HEART CATH AND CORONARY ANGIOGRAPHY;  Surgeon: Yolonda Kida, MD;  Location: [place] [facility];  Service: Cardiovascular;  Laterality: Left;   RIGHT/LEFT HEART CATH AND CORONARY ANGIOGRAPHY N/A 09/13/2019   Procedure: RIGHT/LEFT HEART CATH AND CORONARY ANGIOGRAPHY;  Surgeon: Yolonda Kida, MD;  Location: [place] [facility];  Service: Cardiovascular;  Laterality: N/A;   TOTAL THYROIDECTOMY      Allergies: Allergies as of 12/16/2021 - Review Complete 12/03/2021  Allergen Reaction Noted   Metronidazole Itching 12/26/2012   Penicillin g Rash 12/26/2012    Medications: No outpatient medications have been marked as taking for the 12/16/21 encounter (Appointment)  with Meade Maw, MD.    Social History: Social History   Tobacco Use   Smoking status: Never   Smokeless tobacco: Never  Vaping Use   Vaping Use: Never used  Substance Use Topics   Alcohol use: No    Alcohol/week: 0.0 standard drinks of alcohol   Drug use: No    Family Medical History: Family History  Problem Relation Age of Onset   Hypertension Mother    Hypertension  Father     Physical Examination: Telephone visit - no exam today   Medical Decision Making  Imaging: MRI L spine 09/05/21 Disc levels:   T12- L1: Unremarkable.   L1-L2: Disc narrowing and bulging.  Negative facets.   L2-L3: Disc narrowing and far-lateral predominant bulging.   L3-L4: Disc narrowing and bulging with far-lateral predominant spurring. Right paracentral protrusion with mild mass effect on the right L4 nerve root   L4-L5: Degenerative facet spurring with anterolisthesis. The disc is narrowed and bulging with moderate spinal stenosis. Patent foramina.   L5-S1:Mild disc desiccation.   These results will be called to the ordering clinician or representative by the Radiologist Assistant, and communication documented in the PACS or [employer].   IMPRESSION: 1. Right retroperitoneal edema which may be related to bowel, please correlate with abdominal symptoms and consider abdominal CT. 2. Lumbar spine degeneration most notably at the L4-5 facets where there is marrow edema and anterolisthesis causing moderate spinal stenosis.     Electronically Signed   By: Jorje Guild M.D.   On: 09/07/2021 13:33  I have personally reviewed the images and agree with the above interpretation.  Assessment and Plan: Jesse Thompson is a pleasant 71 y.o. male with anterolisthesis of L4 and L5 with 9 mm on standing x-rays with only 3 mm on supine MRI scan.  This implies lumbar spinal instability.  He has severe facet arthrosis at L4-5 and at least moderate stenosis when he is laying down.  He has tried physical therapy and was discharged due to lack of improvement.  At this point, no further conservative management is indicated.  I have recommended L4-5 lateral lumbar interbody fusion with percutaneous fixation and fusion.  I discussed the planned procedure at length with the patient, including the risks, benefits, alternatives, and indications. The risks discussed include but  are not limited to bleeding, infection, need for reoperation, spinal fluid leak, stroke, vision loss, anesthetic complication, coma, paralysis, and even death. I also described the possibility of psoas weakness and paresthesias. I described in detail that improvement was not guaranteed.  The patient expressed understanding of these risks, and asked that we proceed with surgery. I described the surgery in layman's terms, and gave ample opportunity for questions, which were answered to the best of my ability.  This visit was performed via telephone.  Patient location: home Provider location: office  I spent a total of 5 minutes non-face-to-face activities for this visit on the date of this encounter including review of current clinical condition and response to treatment.      Jesse Rudell K. Jesse Ribas MD, [facility] Neurosurgery

## 2021-12-16 NOTE — Telephone Encounter (Signed)
I spoke with Mr and Mrs Broaddus. We discussed the following:  Planned surgery: L4-5 lateral lumbar interbody fusion and posterior spinal fusion   Surgery date: 12/28/21 - you will find out your arrival time the business day before your surgery.   Pre-op appointment at [facility]: we will call you with a date/time for this. Pre-admit testing is located on the first floor of the [facility], [address], [address]. Please bring all prescriptions in the original prescription bottles to your appointment, even if you have reviewed medications by phone with a pharmacy representative. Pre-op labs may be done at your pre-op appointment. You are not required to fast for these labs. Should you need to change your pre-op appointment, please call Pre-admit testing at [phone].    Surgical clearance: we will send a clearance request to Dr Caryl Comes    Metformin - should be held for 2 days prior to surgery    NSAIDS (Non-steroidal anti-inflammatory drugs): because you are having a fusion, no NSAIDS (such as ibuprofen, aleve, naproxen, meloxicam, diclofenac) for 3 months after surgery. Celebrex is an exception. Tylenol is ok because it is not an NSAID.    Home health physical therapy: [facility] will contact you regarding home health physical therapy for after surgery.Their number is [phone].    Because you are having a fusion: for appointments after your 2 week follow-up: please arrive at the [facility] outpatient imaging center ([place], [place]) or [facility] one hour prior to your appointment for x-rays. This applies to every appointment after your 2 week follow-up. Failure to do so may result in your appointment being rescheduled.   If you have FMLA/disability paperwork, please drop it off or fax it to [phone], attention [place].   If you have any  questions/concerns before or after surgery, you can reach [place] at [phone], or you can send a mychart message. If you have a concern after hours that cannot wait until normal business hours, you can call [phone] and ask to page the neurosurgeon on call for [place].   Appointments/FMLA & disability paperwork: [place] Nurse: Ophelia Shoulder  Medical assistant: Raquel Sarna Physician Assistant's: Cooper Render & Geronimo Boot Surgeon: Meade Maw, MD

## 2021-12-22 ENCOUNTER — Encounter
Admission: RE | Admit: 2021-12-22 | Discharge: 2021-12-22 | Disposition: A | Discharge status: Home / Self Care | Payer: Medicare Other | Source: Ambulatory Visit | Attending: Neurosurgery | Admitting: Neurosurgery

## 2021-12-22 VITALS — BP 146/74 | HR 63 | Resp 16 | Wt 184.5 lb

## 2021-12-22 DIAGNOSIS — Z01818 Encounter for other preprocedural examination: Secondary | ICD-10-CM | POA: Insufficient documentation

## 2021-12-22 DIAGNOSIS — Z01812 Encounter for preprocedural laboratory examination: Secondary | ICD-10-CM

## 2021-12-22 HISTORY — DX: Hypothyroidism, unspecified: E03.9

## 2021-12-22 HISTORY — DX: Atherosclerotic heart disease of native coronary artery without angina pectoris: I25.10

## 2021-12-22 HISTORY — DX: Anemia, unspecified: D64.9

## 2021-12-22 LAB — CBC
HCT: 33.3 % — ABNORMAL LOW (ref 39.0–52.0)
Hemoglobin: 9.4 g/dL — ABNORMAL LOW (ref 13.0–17.0)
MCH: 19.5 pg — ABNORMAL LOW (ref 26.0–34.0)
MCHC: 28.2 g/dL — ABNORMAL LOW (ref 30.0–36.0)
MCV: 69.1 fL — ABNORMAL LOW (ref 80.0–100.0)
Platelets: 154 10*3/uL (ref 150–400)
RBC: 4.82 MIL/uL (ref 4.22–5.81)
RDW: 20.3 % — ABNORMAL HIGH (ref 11.5–15.5)
WBC: 6.4 10*3/uL (ref 4.0–10.5)
nRBC: 0 % (ref 0.0–0.2)

## 2021-12-22 LAB — BASIC METABOLIC PANEL
Anion gap: 11 (ref 5–15)
BUN: 21 mg/dL (ref 8–23)
CO2: 25 mmol/L (ref 22–32)
Calcium: 9.5 mg/dL (ref 8.9–10.3)
Chloride: 106 mmol/L (ref 98–111)
Creatinine, Ser: 1.23 mg/dL (ref 0.61–1.24)
GFR, Estimated: >60 mL/min (ref >60)
Glucose, Bld: 100 mg/dL — ABNORMAL HIGH (ref 70–99)
Potassium: 3.5 mmol/L (ref 3.5–5.1)
Sodium: 142 mmol/L (ref 135–145)

## 2021-12-22 LAB — TYPE AND SCREEN
ABO/RH(D): O NEG
Antibody Screen: NEGATIVE

## 2021-12-22 LAB — SURGICAL PCR SCREEN
MRSA, PCR: NEGATIVE
Staphylococcus aureus: POSITIVE — AB

## 2021-12-22 NOTE — Progress Notes (Addendum)
  [facility] Perioperative Services: Pre-Admission/Anesthesia Testing  Abnormal Lab Notification   Date: 12/22/21  Name: Jesse Thompson MRN:   [id number]  Re: Abnormal labs noted during PAT appointment   Notified:  Provider Name Provider Role Notification Mode  Meade Maw, MD Neurosurgery Routed and/or faxed via [place] and Notes:  ABNORMAL LAB VALUE(S): Lab Results  Component Value Date   HGB 9.4 (L) 12/22/2021   HCT 33.3 (L) 12/22/2021   MCV 69.1 (L) 12/22/2021   MCH 19.5 (L) 12/22/2021   MCHC 28.2 (L) 12/22/2021   RDW 20.3 (H) 12/22/2021    Jesse Thompson is scheduled for a L4-5 LATERAL LUMBAR INTERBODY FUSION AND POSTERIOR SPINAL FUSION on 12/28/2021.  Patient noted to be anemic on preoperative CBC.  Hemoglobin 9.4 g/dL and hematocrit 33.3%, which is much improved when compared to levels back in 09/2021 (Hgb 7.9 / Hct 29.5).   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 He is on daily oral iron 325 mg supplementation; unknown if he is taking with a  course of vitamin C to help with absorption. Of note, patient had labs checked yesterday with his PCP at which time total iron was noted to be low at 28 ug/dL and ferritin was low at 11 ng/mL.  Patient has been seen by GI and has been scheduled for endoscopic evaluation (colonoscopy) on 01/08/2022 for further evaluation.   Honor Loh, MSN, APRN, FNP-C, CEN [facility]  Peri-operative Services Nurse Practitioner Phone: [phone] Fax: [phone] 12/22/21 12:22 PM

## 2021-12-22 NOTE — Patient Instructions (Addendum)
Your procedure is scheduled on: 12/28/21 - MONDAY Report to the Registration Desk on the 1st floor of the [facility]. To find out your arrival time, please call [phone] between 1PM - 3PM on: 12/25/21 - FRIDAY If your arrival time is 6:00 am, do not arrive prior to that time as the [place] entrance doors do not open until 6:00 am.  REMEMBER: Instructions that are not followed completely may result in serious medical risk, up to and including death; or upon the discretion of your surgeon and anesthesiologist your surgery may need to be rescheduled.  Do not eat food after midnight the night before surgery.  No gum chewing, lozengers or hard candies.  You may however, drink CLEAR liquids up to 2 hours before you are scheduled to arrive for your surgery. Do not drink anything within 2 hours of your scheduled arrival time. Type 1 and Type 2 diabetics should only drink water.  TAKE THESE MEDICATIONS THE MORNING OF SURGERY WITH A SIP OF WATER:  - allopurinol (ZYLOPRIM)  - bisoprolol (ZEBETA)  - gabapentin (NEURONTIN)  - hydrALAZINE (APRESOLINE)  - levothyroxine (SYNTHROID, LEVOTHROID)  - omeprazole (PRILOSEC)  - ferrous sulfate    HOLD metFORMIN (GLUCOPHAGE) beginning 12/26/21.  One week prior to surgery: VOLTAREN gel Stop Anti-inflammatories (NSAIDS) such as Advil, Aleve, Ibuprofen, Motrin, Naproxen, Naprosyn and Aspirin based products such as Excedrin, Goodys Powder, BC Powder.  Stop ANY OVER THE COUNTER supplements until after surgery.- VITAMIN D3 , Omega-3 Fatty Acids .  You may however, continue to take Tylenol if needed for pain up until the day of surgery.  No Alcohol for 24 hours before or after surgery.  No Smoking including e-cigarettes for 24 hours prior to surgery.  No chewable tobacco products for at least 6 hours prior to surgery.  No nicotine patches on the day of surgery.  Do not use any "recreational" drugs for at least a week prior to your surgery.   Please be advised that the combination of cocaine and anesthesia may have negative outcomes, up to and including death. If you test positive for cocaine, your surgery will be cancelled.  On the morning of surgery brush your teeth with toothpaste and water, you may rinse your mouth with mouthwash if you wish. Do not swallow any toothpaste or mouthwash.  Use CHG Soap or wipes as directed on instruction sheet.  Do not wear jewelry, make-up, hairpins, clips or nail polish.  Do not wear lotions, powders, or perfumes.   Do not shave body from the neck down 48 hours prior to surgery just in case you cut yourself which could leave a site for infection.  Also, freshly shaved skin may become irritated if using the CHG soap.  Contact lenses, hearing aids and dentures may not be worn into surgery.  Do not bring valuables to the hospital. [facility] is not responsible for any missing/lost belongings or valuables.   Notify your doctor if there is any change in your medical condition (cold, fever, infection).  Wear comfortable clothing (specific to your surgery type) to the hospital.  After surgery, you can help prevent lung complications by doing breathing exercises.  Take deep breaths and cough every 1-2 hours. Your doctor may order a device called an Incentive Spirometer to help you take deep breaths. When coughing or sneezing, hold a pillow firmly against your incision with both hands. This is called "splinting." Doing this helps protect your incision. It also decreases belly discomfort.  If you are  being admitted to the hospital overnight, leave your suitcase in the car. After surgery it may be brought to your room.  If you are being discharged the day of surgery, you will not be allowed to drive home. You will need a responsible adult (18 years or older) to drive you home and stay with you that night.   If you are taking public transportation, you will need to have a responsible adult (18  years or older) with you. Please confirm with your physician that it is acceptable to use public transportation.   Please call the [place] Dept. at [phone] if you have any questions about these instructions.  Surgery Visitation Policy:  Patients undergoing a surgery or procedure may have two family members or support persons with them as long as the person is not COVID-19 positive or experiencing its symptoms.   Inpatient Visitation:    Visiting hours are 7 a.m. to 8 p.m. Up to four visitors are allowed at one time in a patient room. The visitors may rotate out with other people during the day. One designated support person (adult) may remain overnight.  MASKING: Due to an increase in RSV rates and hospitalizations, starting Wednesday, Nov. 15, in patient care areas in which we serve newborns, infants and children, masks will be required for teammates and visitors.  Children ages 67 and under may not visit. This policy affects the following departments only:  [place] Postpartum area Mother Baby Unit Newborn nursery/Special care nursery  Other areas: Masks continue to be strongly recommended for [place] teammates, visitors and patients in all other areas. Visitation is not restricted outside of the units listed above.

## 2021-12-23 ENCOUNTER — Encounter: Payer: Self-pay | Admitting: Neurosurgery

## 2021-12-23 NOTE — Progress Notes (Signed)
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 Clinical nursing staff has updated patient's PMH/PSHx, current medication list, and drug allergies/intolerances to ensure comprehensive history available to assist in medical decision making as it pertains to the aforementioned surgical procedure and anticipated anesthetic course. Extensive review of available clinical information performed. [place] PM[facility] and PSHx updated with any diagnoses/procedures that  may have been inadvertently omitted during his intake with the pre-admission testing department's nursing staff.  Clinical Discussion:  Jesse Thompson is a 71 y.o. male who is submitted for pre-surgical anesthesia review and clearance prior to him undergoing the above procedure. Patient has never been a smoker. Pertinent PMH includes: CAD, NSTEMI, HFpEF, TIA, aortic atherosclerosis, HTN, HLD, T2DM, hypothyroidism (s/p I-131 treatment and subsequent total thyroidectomy and parathyroidectomy), CKD-III, GERD (on daily PPI, esophageal stricture (s/p dilatation x2), primary sclerosing cholangitis, pulmonary nodules, DOE, peripheral edema, anemia, OA, lumbar spondylolisthesis, HOH.  Patient is followed by cardiology Clayborn Bigness, MD). He was last  seen in the cardiology clinic on 10/09/2021; notes reviewed.  At the time of this clinic visit, patient doing well overall from a cardiovascular perspective.  He denied any episodes of chest pain, however he continued to experience chronic exertional dyspnea which was reported to be stable and at baseline.  Patient denied any PND, orthopnea, palpitations, significant peripheral edema, vertiginous symptoms, or presyncope/syncope.  Patient with past medical history significant for cardiovascular diagnoses.  Patient suffered an NSTEMI on 09/13/2019 revealing low normal left ventricular systolic function with an EF of 50-55%.  There was multivessel CAD; 75% proximal LAD, 75% mid LAD, and tandem 50 and 90% stenoses of OM1.  PCI was performed placing a 2.25 x 15 mm resolute Onyx DES to both the proximal LAD and mid LAD lesions.  2.5 x 22 mm resolute Onyx DES also placed within the OM1.  Procedure yielded excellent angiographic result and TIMI-3 flow.  Most recent TTE was performed on 07/29/2020 revealing a low normal left ventricular systolic function with mild LVH; LVEF 50%.  Left atrium was moderately enlarged.  There was mild mitral and tricuspid valve regurgitation.  There was no evidence of a significant transvalvular gradient suggestive of stenosis.  Last myocardial perfusion imaging study was performed on 08/28/2020 revealing a normal left ventricular systolic function with an EF of 57%.  There were no regional wall motion abnormalities.  Left ventricular cavity size normal.  SPECT images demonstrated a moderate mixed perfusion abnormality of moderate intensity present in the anterior region on stress images.  Findings consistent with possible reversible ischemia.  Patient with poor exercise tolerance.  Repeat diagnostic left heart catheterization was performed on 10/27/2020 revealing multivessel CAD; 25% proximal LCx, 100% D1, 75% RPDA-1, 99% RPDA-2, 75% RPDA-3 and 25% mid to  distal RCA.  There were  minimal luminal irregularities noted throughout the RCA.  The decision was made to defer further intervention opting for medical management.  Blood pressure mildly elevated at 140/70 mmHg on currently prescribed beta-blocker (bisoprolol), diuretic (furosemide + HCTZ), vasodilator (hydralazine), and ARB (valsartan) therapies.  Patient is on rosuvastatin for his HLD diagnosis and further ASCVD prevention.  T2DM well-controlled on currently prescribed regimen; last HgbA1c was 6.2% when checked on 09/24/2021.  Patient does not have an OSAH diagnosis.. Patient making efforts to be more active. Functional capacity, as defined by DASI, is documented as being >/= 4 METS.  No changes were made to his medication regimen.  Patient to follow-up with outpatient cardiology and 6 months or sooner if needed.  Jesse Thompson is scheduled for an L4-5 LATERAL LUMBAR INTERBODY FUSION AND POSTERIOR SPINAL FUSION on 12/28/2021 with Dr. Meade Maw, MD.  Given patient's past medical history significant for cardiovascular diagnoses, presurgical cardiac clearance was sought by the PAT team. Additionally, surgical team requested clearance from patient's PCP as follows:  Per internal medicine Caryl Comes, MD), "this patient is optimized for surgery and may proceed with the planned procedural course with a MODERATE risk of significant perioperative complications".  Per cardiology, "this patient is optimized for surgery and may proceed with the planned procedural course with a LOW risk of significant perioperative cardiovascular complications".  In review of his medication reconciliation, the patient is not noted to be taking any type of anticoagulation or antiplatelet therapies that would need to be held during his perioperative course.  Patient denies previous perioperative complications with anesthesia in the past. In review of the available records, it is noted that patient underwent a general anesthetic course here at [facility] ([facility]) in 09/2016 without documented complications.      12/22/2021    8:54 AM 12/03/2021    1:00 PM 07/18/2021   12:00 PM  Vitals with BMI  Height  '5\' 6"'$    Weight 184 lbs 8 oz 188 lbs   BMI 90.3 00.92   Systolic 330 076 226  Diastolic 74 72 83  Pulse 63  56    Providers/Specialists:   NOTE: Primary physician provider listed below. Patient may have been seen by APP or partner within same practice.   PROVIDER ROLE / SPECIALTY LAST Dola Factor, MD Neurosurgery (Surgeon) 12/16/2021  Adin Hector, MD Primary Care Provider  12/21/2021  Katrine Coho, MD Cardiology 10/09/2021  Sharlet Salina, MD Physiatry 11/20/2021   Allergies:  Metronidazole and Penicillin g  Current Home Medications:   No current facility-administered medications for this encounter.    allopurinol (ZYLOPRIM) 100 MG tablet   bisoprolol (ZEBETA) 5 MG tablet   cholecalciferol (VITAMIN D3) 25 MCG (1000 UNIT) tablet   diclofenac sodium (VOLTAREN) 1 % GEL   famotidine (PEPCID) 20 MG tablet   ferrous sulfate 324 MG TBEC   furosemide (LASIX) 20 MG tablet   gabapentin (NEURONTIN) 400 MG capsule   glipiZIDE (GLUCOTROL XL) 2.5 MG 24 hr tablet   hydrALAZINE (APRESOLINE) 100 MG tablet   levothyroxine (SYNTHROID, LEVOTHROID) 100 MCG tablet   meloxicam (MOBIC) 15 MG tablet   metFORMIN (GLUCOPHAGE) 1000 MG tablet   Omega-3 Fatty Acids (FISH OIL) 1000 MG CAPS   omeprazole (PRILOSEC) 40 MG capsule   rosuvastatin (CRESTOR) 5 MG tablet   valsartan-hydrochlorothiazide (DIOVAN-HCT) 160-12.5 MG tablet   vitamin B-12 (CYANOCOBALAMIN) 1000 MCG tablet   History:   Past Medical  History:  Diagnosis Date   (HFpEF) heart failure with preserved ejection fraction ([place])    a.) TTE 01/13/2017: EF >55%, BAE, triv AR, mild TR/PR, G2DD; b.) TTE 12/06/2017: EF >55%, BAE, triv AR/PR, mild MR/TR, G1DD; c.) TTE 07/24/2019: EF >55%, LAE, triv AR/PR, mild MR/TR, G2DD; d.) TTE  07/29/2020: EF 50%, mild LVH, mod LAE, mild MR/TR   Anemia    Aortic atherosclerosis (HCC)    Bursitis    CKD (chronic kidney disease), stage III (HCC)    Coronary artery disease    a.) R/LHC/PCI 09/13/2019: EF 50-55%. 75% pLAD (2.25 x 15 mm Resolute Onyx DES), 100% D1, 75% mLAD (2.25 x 15 mm Resolute Onyx DES), 25% pLCx, tandem 50% and 90% OM1 (2.5 x 22 mm Resolute Onyx DES). mPA 18, mPCWP 10, LVEDP 12, PA sat 82, AO sat 100, PVR 76, CO 8.4; b.) LHC 10/27/2020: 25 pLCx, 100% D1, 75% RPDA-1, 99% RPDA-2, 75% RPDA-3, 25% m-dRCA, min luminal irregs RCA - med mgmt.   DOE (dyspnea on exertion)    Esophageal stricture    a.) s/p dilitation x 2   GERD (gastroesophageal reflux disease)    Gout    History of Graves' disease    a.) s/p I-131 treatment in 08/2001 --> resulting clinical hypothyroidism; b.) s/p total thyroidectomy   HOH (hard of hearing)    Bilateral Hearing Aids   Hyperlipidemia    Hypertension    Hypothyroidism    NSTEMI (non-ST elevated myocardial infarction) ([place]) 09/13/2019   a.) PCI 09/10/2019: EF 50-55%. 75% pLAD (2.25 x 15 mm Resolute Onyx DES), 75% mLAD (2.25 x 15 mm Resolute Onyx DES), tandem 50% and 90% OM1 (2.5 x 22 mm Resolute Onyx DES)   OSA (obstructive sleep apnea)    a.) not using prescribed nocturnal PAP therapy   Osteoarthritis    Peripheral edema    Primary sclerosing cholangitis 2002   a.) Dx'd with ERCP done at [facility] in 2002   Pulmonary nodules    Spondylolisthesis of lumbar region    T2DM (type 2 diabetes mellitus) ([place])    TIA (transient ischemic attack)    Vertigo    Past Surgical History:  Procedure Laterality Date   ANKLE FRACTURE SURGERY Right    BILE DUCT STENT PLACEMENT  2015   now out   CARPAL TUNNEL RELEASE Right 09/15/2016   Procedure: CARPAL TUNNEL RELEASE;  Surgeon: Earnestine Leys, MD;  Location: [facility] ORS;  Service: Orthopedics;  Laterality: Right;   CARPAL TUNNEL RELEASE Left 09/29/2016   Procedure: CARPAL TUNNEL RELEASE;  Surgeon:  Earnestine Leys, MD;  Location: [facility] ORS;  Service: Orthopedics;  Laterality: Left;  sutures removed right hand from status post carpal tunnel   COLONOSCOPY WITH ESOPHAGOGASTRODUODENOSCOPY (EGD)     CORONARY STENT INTERVENTION N/A 09/13/2019   Procedure: CORONARY STENT INTERVENTION;  Surgeon: Yolonda Kida, MD;  Location: [place] [facility];  Service: Cardiovascular;  Laterality: N/A;  LAD   LEFT HEART CATH AND CORONARY ANGIOGRAPHY Left 10/27/2020   Procedure: LEFT HEART CATH AND CORONARY ANGIOGRAPHY;  Surgeon: Yolonda Kida, MD;  Location: [place] [facility];  Service: Cardiovascular;  Laterality: Left;   PARATHYROIDECTOMY / EXPLORATION OF PARATHYROIDS  12/26/2015   RIGHT/LEFT HEART CATH AND CORONARY ANGIOGRAPHY N/A 09/13/2019   Procedure: RIGHT/LEFT HEART CATH AND CORONARY ANGIOGRAPHY;  Surgeon: Yolonda Kida, MD;  Location: [place] [facility];  Service: Cardiovascular;  Laterality: N/A;   TOTAL THYROIDECTOMY     Family History  Problem Relation  Age of Onset   Hypertension Mother    Hypertension Father    Social History   Tobacco Use   Smoking status: Never   Smokeless tobacco: Never  Vaping Use   Vaping Use: Never used  Substance Use Topics   Alcohol use: No    Alcohol/week: 0.0 standard drinks of alcohol   Drug use: No    Pertinent Clinical Results:  LABS: Labs reviewed: Acceptable for surgery.  No visits with results within 3 Day(s) from this visit.  Latest known visit with results is:  Hospital Outpatient Visit on 12/22/2021  Component Date Value Ref Range Status   WBC 12/22/2021 6.4  4.0 - 10.5 K/uL Final   RBC 12/22/2021 4.82  4.22 - 5.81 MIL/uL Final   Hemoglobin 12/22/2021 9.4 (L)  13.0 - 17.0 g/dL Final   HCT 12/22/2021 33.3 (L)  39.0 - 52.0 % Final   MCV 12/22/2021 69.1 (L)  80.0 - 100.0 fL Final   MCH 12/22/2021 19.5 (L)  26.0 - 34.0 pg Final   MCHC 12/22/2021 28.2 (L)  30.0 - 36.0 g/dL Final   RDW 12/22/2021 20.3 (H)  11.5 - 15.5 % Final    Platelets 12/22/2021 154  150 - 400 K/uL Final   nRBC 12/22/2021 0.0  0.0 - 0.2 % Final   Performed at [facility], [place], [place] [address]   Sodium 12/22/2021 142  135 - 145 mmol/L Final   Potassium 12/22/2021 3.5  3.5 - 5.1 mmol/L Final   Chloride 12/22/2021 106  98 - 111 mmol/L Final   CO2 12/22/2021 25  22 - 32 mmol/L Final   Glucose, Bld 12/22/2021 100 (H)  70 - 99 mg/dL Final   Glucose reference range applies only to samples taken after fasting for at least 8 hours.   BUN 12/22/2021 21  8 - 23 mg/dL Final   Creatinine, Ser 12/22/2021 1.23  0.61 - 1.24 mg/dL Final   Calcium 12/22/2021 9.5  8.9 - 10.3 mg/dL Final   GFR, Estimated 12/22/2021 >60  >60 mL/min Final   Comment: (NOTE) Calculated using the CKD-EPI Creatinine Equation (2021)    Anion gap 12/22/2021 11  5 - 15 Final   Performed at [facility], [address] [place], [place] [address]   ABO/RH(D) 12/22/2021 O NEG   Final   Antibody Screen 12/22/2021 NEG   Final   Sample Expiration 12/22/2021 01/05/2022,2359   Final   Extend sample reason 12/22/2021    Final                   Value:NO TRANSFUSIONS OR PREGNANCY IN THE PAST 3 MONTHS Performed at [facility], [address] [place], [place] [address]    MRSA, PCR 12/22/2021 NEGATIVE  NEGATIVE Final   Staphylococcus aureus 12/22/2021 POSITIVE (A)  NEGATIVE Final   Comment: (NOTE) The Xpert SA Assay (FDA approved for NASAL specimens in patients 66 years of age and older), is one component of a comprehensive surveillance program. It is not intended to diagnose infection nor to guide or monitor treatment. Performed at [facility], [address] [place], [place] [address]     ECG: Date: 12/22/2021 Time ECG obtained: 0959 AM Rate: 61 bpm Rhythm:  Sinus rhythm with PACs Axis (leads I and aVF): Normal Intervals: PR 167 ms. QRS 94 ms. QTc 465 ms. ST segment and T wave changes: No evidence of acute ST  segment elevation or depression Comparison: Similar to previous  tracing obtained on 09/14/2019   IMAGING / PROCEDURES: LEFT HEART CATHETERIZATION AND CORONARY ANGIOGRAPHY performed on 10/27/2020 Normal left ventricular systolic function with an EF of 55-65% Normal LVEDP Multi vessel CAD 25% proximal LCx 100% D1 75% RPDA-1 99% RPDA-2 75% RPDA-3 25% mid to distal RCA RCA large with moderate luminal irregularities Previously placed stents to the LAD and OM1 are widely patent Recommendations: conservative therapy   MR ABDOMEN MRCP W WO CONTRAST performed on 10/21/2021 Stable cirrhotic changes involving the liver with significant right hepatic lobe atrophy and mildly dilated intrahepatic ducts likely secondary to chronic PSC. No worrisome early arterial phase enhancing lesions to suggest hepatoma or dysplastic nodule. Stable splenomegaly. Small amount of scattered abdominal fluid without overt ascites.   CT ABDOMEN PELVIS W CONTRAST performed on 09/22/2021 Peripancreatic fat stranding and edema as well as mild bowel wall thickening involving the proximal duodenum. Correlation with amylase and lipase is recommended to exclude pancreatitis/duodenitis. Mild ascites noted in the perihepatic region, pericolic gutters and pelvis. Atrophy of the right lobe of the liver with focally dilated biliary ducts in the right lobe, slightly increased from 2019 and may be associated with patient's history of sclerosing cholangitis. The liver also has a lobular contour suggesting underlying changes of cirrhosis. Splenomegaly. Prominent lymph nodes in the gastrohepatic ligament, porta hepatis, and mesenteric root, not significantly changed from the prior exam. Aortic atherosclerosis  MR LUMBAR SPINE WO CONTRAST performed on 09/05/2021 Right retroperitoneal edema which may be related to bowel, please correlate with abdominal symptoms and consider abdominal CT. Lumbar spine degeneration most notably at the  L4-5 facets where there is marrow edema and anterolisthesis causing moderate spinal stenosis.  MYOCARDIAL PERFUSION IMAGING STUDY (LEXISCAN) performed on 08/28/2020 Normal left ventricular systolic function with a EF of 57% Normal regional wall motion and wall thickening SPECT images demonstrate a moderate mixed perfusion abnormality of moderate intensity present in the anterior region on stress images suggestive of possible reversible ischemia. Poor exercise tolerance with possible anterior ischemia Abnormal myocardial perfusion scan  TRANSTHORACIC ECHOCARDIOGRAM performed on 07/29/2020 Normal left ventricular systolic function with an EF of 50% Mild LVH Normal left ventricular diastolic Doppler parameters Left atrium moderately enlarged Normal right ventricular systolic function Mild MR and TR Normal transvalvular gradients; no valvular stenosis No pericardial effusion  Impression and Plan:  Jesse Thompson has been referred for pre-anesthesia review and clearance prior to him undergoing the planned anesthetic and procedural courses. Available labs, pertinent testing, and imaging results were personally reviewed by me. This patient has been appropriately cleared by cardiology (LOW) and by internal medicine (MODERATE) with the individually indicated risk of significant perioperative complications.  Based on clinical review performed today (12/25/21), barring any significant acute changes in the patient's overall condition, it is anticipated that he will be able to proceed with the planned surgical intervention. Any acute changes in clinical condition may necessitate his procedure being postponed and/or cancelled. Patient will meet with anesthesia team (MD and/or CRNA) on the day of his procedure for preoperative evaluation/assessment. Questions regarding anesthetic course will be fielded at that time.   Pre-surgical instructions were reviewed with the patient during his PAT appointment and  questions were fielded by PAT clinical staff. Patient was advised that if any questions or concerns arise prior to his procedure then he should return a call to PAT and/or his surgeon's office to discuss.  Honor Loh, MSN, APRN, FNP-C, CEN [place]  Peri-operative Services Nurse Practitioner Phone: [phone] Fax: [phone])  [phone] 12/25/21 10:27 AM  NOTE: This note has been prepared using [employer]. Despite my best ability to proofread, there is always the potential that unintentional transcriptional errors may still occur from this process.

## 2021-12-24 ENCOUNTER — Encounter: Payer: Self-pay | Admitting: Neurosurgery

## 2021-12-28 ENCOUNTER — Telehealth: Payer: Self-pay

## 2021-12-28 NOTE — Telephone Encounter (Signed)
Jesse Thompson was scheduled for surgery this morning. Despite multiple calls to [facility] regarding authorization for surgery (original submission was 12/16/21), it is still pending review as of 10:30am. Therefore, surgery has been rescheduled to 01/18/22. I have spoken with his wife about this.

## 2022-01-08 ENCOUNTER — Other Ambulatory Visit: Payer: Medicare Other

## 2022-01-11 ENCOUNTER — Encounter
Admission: RE | Admit: 2022-01-11 | Discharge: 2022-01-11 | Disposition: A | Discharge status: Home / Self Care | Payer: Medicare Other | Source: Ambulatory Visit | Attending: Neurosurgery | Admitting: Neurosurgery

## 2022-01-11 NOTE — Pre-Procedure Instructions (Addendum)
TC to wife June to confirm that patient still Preop instructions and if any medication changes. No changes to his medications and or medical history. She stated that they have the preop instructions from 2 weeks ago and the soap and no questions at this time. Wife, stated that he is not taking Plavix, that it was discontinued by NP. Gave information to [facility] to check on this information.

## 2022-01-12 ENCOUNTER — Encounter: Payer: Medicare Other | Admitting: Orthopedic Surgery

## 2022-01-17 MED ORDER — SODIUM CHLORIDE 0.9 % IV SOLN: INTRAVENOUS | Status: DC

## 2022-01-17 MED ORDER — CEFAZOLIN SODIUM-DEXTROSE 2-4 GM/100ML-% IV SOLN
2.0000 g | INTRAVENOUS | Status: AC
  Administered 2022-01-18: 2 g via INTRAVENOUS

## 2022-01-17 MED ORDER — VANCOMYCIN HCL IN DEXTROSE 1-5 GM/200ML-% IV SOLN: 1000.0000 mg | Freq: Once | INTRAVENOUS | Status: AC

## 2022-01-17 MED ORDER — CHLORHEXIDINE GLUCONATE 0.12 % MT SOLN: 15.0000 mL | Freq: Once | OROMUCOSAL | Status: AC

## 2022-01-17 MED ORDER — ORAL CARE MOUTH RINSE: 15.0000 mL | Freq: Once | OROMUCOSAL | Status: AC

## 2022-01-17 MED ORDER — CEFAZOLIN IN SODIUM CHLORIDE 2-0.9 GM/100ML-% IV SOLN
2.0000 g | Freq: Once | INTRAVENOUS | Status: DC
  Filled 2022-01-17: qty 100

## 2022-01-18 ENCOUNTER — Inpatient Hospital Stay: Payer: Medicare Other

## 2022-01-18 ENCOUNTER — Other Ambulatory Visit: Payer: Self-pay

## 2022-01-18 ENCOUNTER — Inpatient Hospital Stay: Payer: Medicare Other | Admitting: Urgent Care

## 2022-01-18 ENCOUNTER — Encounter
Admission: RE | Disposition: A | Discharge status: Home Health | Payer: Self-pay | Source: Home / Self Care | Attending: Neurosurgery

## 2022-01-18 ENCOUNTER — Encounter: Payer: Self-pay | Admitting: Neurosurgery

## 2022-01-18 ENCOUNTER — Inpatient Hospital Stay
Admission: RE | Admit: 2022-01-18 | Discharge: 2022-01-19 | DRG: 454 | Disposition: A | Discharge status: Home Health | Payer: Medicare Other | Attending: Neurosurgery | Admitting: Neurosurgery

## 2022-01-18 DIAGNOSIS — I2510 Atherosclerotic heart disease of native coronary artery without angina pectoris: Secondary | ICD-10-CM | POA: Diagnosis present

## 2022-01-18 DIAGNOSIS — N183 Chronic kidney disease, stage 3 unspecified: Secondary | ICD-10-CM | POA: Diagnosis present

## 2022-01-18 DIAGNOSIS — I130 Hypertensive heart and chronic kidney disease with heart failure and stage 1 through stage 4 chronic kidney disease, or unspecified chronic kidney disease: Secondary | ICD-10-CM | POA: Diagnosis present

## 2022-01-18 DIAGNOSIS — M5442 Lumbago with sciatica, left side: Secondary | ICD-10-CM | POA: Diagnosis not present

## 2022-01-18 DIAGNOSIS — M532X6 Spinal instabilities, lumbar region: Secondary | ICD-10-CM | POA: Diagnosis present

## 2022-01-18 DIAGNOSIS — I700 Atherosclerosis of aorta: Secondary | ICD-10-CM | POA: Diagnosis present

## 2022-01-18 DIAGNOSIS — Z888 Allergy status to other drugs, medicaments and biological substances status: Secondary | ICD-10-CM

## 2022-01-18 DIAGNOSIS — K219 Gastro-esophageal reflux disease without esophagitis: Secondary | ICD-10-CM | POA: Diagnosis present

## 2022-01-18 DIAGNOSIS — Z79890 Hormone replacement therapy (postmenopausal): Secondary | ICD-10-CM | POA: Diagnosis not present

## 2022-01-18 DIAGNOSIS — M109 Gout, unspecified: Secondary | ICD-10-CM | POA: Diagnosis present

## 2022-01-18 DIAGNOSIS — Z01818 Encounter for other preprocedural examination: Secondary | ICD-10-CM

## 2022-01-18 DIAGNOSIS — Z880 Allergy status to penicillin: Secondary | ICD-10-CM | POA: Diagnosis not present

## 2022-01-18 DIAGNOSIS — G8929 Other chronic pain: Secondary | ICD-10-CM | POA: Diagnosis present

## 2022-01-18 DIAGNOSIS — Z79899 Other long term (current) drug therapy: Secondary | ICD-10-CM

## 2022-01-18 DIAGNOSIS — I252 Old myocardial infarction: Secondary | ICD-10-CM | POA: Diagnosis not present

## 2022-01-18 DIAGNOSIS — Z8673 Personal history of transient ischemic attack (TIA), and cerebral infarction without residual deficits: Secondary | ICD-10-CM

## 2022-01-18 DIAGNOSIS — Z7984 Long term (current) use of oral hypoglycemic drugs: Secondary | ICD-10-CM

## 2022-01-18 DIAGNOSIS — Z8249 Family history of ischemic heart disease and other diseases of the circulatory system: Secondary | ICD-10-CM | POA: Diagnosis not present

## 2022-01-18 DIAGNOSIS — I5032 Chronic diastolic (congestive) heart failure: Secondary | ICD-10-CM | POA: Diagnosis present

## 2022-01-18 DIAGNOSIS — M5416 Radiculopathy, lumbar region: Secondary | ICD-10-CM | POA: Diagnosis present

## 2022-01-18 DIAGNOSIS — E1122 Type 2 diabetes mellitus with diabetic chronic kidney disease: Secondary | ICD-10-CM | POA: Diagnosis present

## 2022-01-18 DIAGNOSIS — G4733 Obstructive sleep apnea (adult) (pediatric): Secondary | ICD-10-CM | POA: Diagnosis present

## 2022-01-18 DIAGNOSIS — M4316 Spondylolisthesis, lumbar region: Principal | ICD-10-CM

## 2022-01-18 DIAGNOSIS — E890 Postprocedural hypothyroidism: Secondary | ICD-10-CM | POA: Diagnosis present

## 2022-01-18 DIAGNOSIS — E785 Hyperlipidemia, unspecified: Secondary | ICD-10-CM | POA: Diagnosis present

## 2022-01-18 DIAGNOSIS — Z01812 Encounter for preprocedural laboratory examination: Secondary | ICD-10-CM

## 2022-01-18 DIAGNOSIS — Z981 Arthrodesis status: Principal | ICD-10-CM

## 2022-01-18 HISTORY — DX: Other forms of dyspnea: R06.09

## 2022-01-18 HISTORY — DX: Other nonspecific abnormal finding of lung field: R91.8

## 2022-01-18 HISTORY — DX: Edema, unspecified: R60.9

## 2022-01-18 HISTORY — PX: ANTERIOR LATERAL LUMBAR FUSION WITH PERCUTANEOUS SCREW 1 LEVEL: SHX5553

## 2022-01-18 HISTORY — DX: Unspecified diastolic (congestive) heart failure: I50.30

## 2022-01-18 HISTORY — DX: Spondylolisthesis, lumbar region: M43.16

## 2022-01-18 HISTORY — DX: Personal history of other endocrine, nutritional and metabolic disease: Z86.39

## 2022-01-18 HISTORY — DX: Obstructive sleep apnea (adult) (pediatric): G47.33

## 2022-01-18 HISTORY — PX: APPLICATION OF INTRAOPERATIVE CT SCAN: SHX6668

## 2022-01-18 HISTORY — DX: Localized edema: R60.0

## 2022-01-18 HISTORY — DX: Esophageal obstruction: K22.2

## 2022-01-18 HISTORY — DX: Type 2 diabetes mellitus without complications: E11.9

## 2022-01-18 HISTORY — DX: Atherosclerosis of aorta: I70.0

## 2022-01-18 HISTORY — DX: Chronic kidney disease, stage 3 unspecified: N18.30

## 2022-01-18 HISTORY — DX: Transient cerebral ischemic attack, unspecified: G45.9

## 2022-01-18 LAB — TYPE AND SCREEN
ABO/RH(D): O NEG
Antibody Screen: NEGATIVE

## 2022-01-18 LAB — GLUCOSE, CAPILLARY
Glucose-Capillary: 128 mg/dL — ABNORMAL HIGH (ref 70–99)
Glucose-Capillary: 162 mg/dL — ABNORMAL HIGH (ref 70–99)
Glucose-Capillary: 193 mg/dL — ABNORMAL HIGH (ref 70–99)

## 2022-01-18 SURGERY — ANTERIOR LATERAL LUMBAR FUSION WITH PERCUTANEOUS SCREW 1 LEVEL
Anesthesia: General

## 2022-01-18 MED ORDER — ONDANSETRON HCL 4 MG PO TABS: 4.0000 mg | ORAL_TABLET | Freq: Four times a day (QID) | ORAL | Status: DC | PRN

## 2022-01-18 MED ORDER — SODIUM CHLORIDE 0.9% FLUSH: 3.0000 mL | Freq: Two times a day (BID) | INTRAVENOUS | Status: DC

## 2022-01-18 MED ORDER — CEFAZOLIN SODIUM-DEXTROSE 2-4 GM/100ML-% IV SOLN
INTRAVENOUS | Status: AC
  Filled 2022-01-18: qty 100

## 2022-01-18 MED ORDER — ONDANSETRON HCL 4 MG/2ML IJ SOLN: 4.0000 mg | Freq: Four times a day (QID) | INTRAMUSCULAR | Status: DC | PRN

## 2022-01-18 MED ORDER — PROPOFOL 1000 MG/100ML IV EMUL
INTRAVENOUS | Status: AC
  Filled 2022-01-18: qty 100

## 2022-01-18 MED ORDER — GLYCOPYRROLATE 0.2 MG/ML IJ SOLN
INTRAMUSCULAR | Status: AC
  Filled 2022-01-18: qty 1

## 2022-01-18 MED ORDER — METHOCARBAMOL 1000 MG/10ML IJ SOLN
500.0000 mg | Freq: Four times a day (QID) | INTRAVENOUS | Status: DC | PRN
  Administered 2022-01-18: 500 mg via INTRAVENOUS
  Filled 2022-01-18: qty 500

## 2022-01-18 MED ORDER — SODIUM CHLORIDE 0.9 % IV SOLN: INTRAVENOUS | Status: DC

## 2022-01-18 MED ORDER — VITAMIN B-12 1000 MCG PO TABS: 1000.0000 ug | ORAL_TABLET | Freq: Every day | ORAL | Status: DC

## 2022-01-18 MED ORDER — VANCOMYCIN HCL IN DEXTROSE 1-5 GM/200ML-% IV SOLN
INTRAVENOUS | Status: AC
  Administered 2022-01-18: 1000 mg via INTRAVENOUS
  Filled 2022-01-18: qty 200

## 2022-01-18 MED ORDER — SODIUM CHLORIDE FLUSH 0.9 % IV SOLN
INTRAVENOUS | Status: AC
  Filled 2022-01-18: qty 20

## 2022-01-18 MED ORDER — ONDANSETRON HCL 4 MG/2ML IJ SOLN
INTRAMUSCULAR | Status: DC | PRN
  Administered 2022-01-18: 4 mg via INTRAVENOUS

## 2022-01-18 MED ORDER — FAMOTIDINE 20 MG PO TABS
ORAL_TABLET | ORAL | Status: AC
  Filled 2022-01-18: qty 1

## 2022-01-18 MED ORDER — FAMOTIDINE 20 MG PO TABS
20.0000 mg | ORAL_TABLET | Freq: Every day | ORAL | Status: DC
  Administered 2022-01-18: 20 mg via ORAL

## 2022-01-18 MED ORDER — METFORMIN HCL 500 MG PO TABS: 1000.0000 mg | ORAL_TABLET | Freq: Two times a day (BID) | ORAL | Status: DC

## 2022-01-18 MED ORDER — HYDROMORPHONE HCL 1 MG/ML IJ SOLN
INTRAMUSCULAR | Status: AC
  Administered 2022-01-18: 0.5 mg via INTRAVENOUS
  Filled 2022-01-18: qty 1

## 2022-01-18 MED ORDER — DOCUSATE SODIUM 100 MG PO CAPS
ORAL_CAPSULE | ORAL | Status: AC
  Filled 2022-01-18: qty 1

## 2022-01-18 MED ORDER — KETOROLAC TROMETHAMINE 15 MG/ML IJ SOLN
INTRAMUSCULAR | Status: AC
  Filled 2022-01-18: qty 1

## 2022-01-18 MED ORDER — LIDOCAINE HCL (PF) 2 % IJ SOLN
INTRAMUSCULAR | Status: AC
  Filled 2022-01-18: qty 5

## 2022-01-18 MED ORDER — ACETAMINOPHEN 10 MG/ML IV SOLN
INTRAVENOUS | Status: AC
  Filled 2022-01-18: qty 100

## 2022-01-18 MED ORDER — OXYCODONE HCL 5 MG PO TABS
ORAL_TABLET | ORAL | Status: AC
  Administered 2022-01-18: 10 mg via ORAL
  Filled 2022-01-18: qty 2

## 2022-01-18 MED ORDER — ONDANSETRON HCL 4 MG/2ML IJ SOLN
INTRAMUSCULAR | Status: AC
  Filled 2022-01-18: qty 2

## 2022-01-18 MED ORDER — DEXMEDETOMIDINE HCL IN NACL 200 MCG/50ML IV SOLN
INTRAVENOUS | Status: DC | PRN
  Administered 2022-01-18: 12 ug via INTRAVENOUS

## 2022-01-18 MED ORDER — SUCCINYLCHOLINE CHLORIDE 200 MG/10ML IV SOSY
PREFILLED_SYRINGE | INTRAVENOUS | Status: AC
  Filled 2022-01-18: qty 10

## 2022-01-18 MED ORDER — FUROSEMIDE 20 MG PO TABS
ORAL_TABLET | ORAL | Status: AC
  Filled 2022-01-18: qty 1

## 2022-01-18 MED ORDER — IRRISEPT - 450ML BOTTLE WITH 0.05% CHG IN STERILE WATER, USP 99.95% OPTIME
TOPICAL | Status: DC | PRN
  Administered 2022-01-18: 150 mL

## 2022-01-18 MED ORDER — HYDROCHLOROTHIAZIDE 12.5 MG PO TABS
12.5000 mg | ORAL_TABLET | Freq: Every day | ORAL | Status: DC
  Filled 2022-01-18: qty 1

## 2022-01-18 MED ORDER — CHLORHEXIDINE GLUCONATE 0.12 % MT SOLN
OROMUCOSAL | Status: AC
  Administered 2022-01-18: 15 mL via OROMUCOSAL
  Filled 2022-01-18: qty 15

## 2022-01-18 MED ORDER — BUPIVACAINE-EPINEPHRINE 0.5% -1:200000 IJ SOLN
INTRAMUSCULAR | Status: DC | PRN
  Administered 2022-01-18: 5 mL
  Administered 2022-01-18: 8 mL

## 2022-01-18 MED ORDER — MAGNESIUM CITRATE PO SOLN: 1.0000 | Freq: Once | ORAL | Status: DC | PRN

## 2022-01-18 MED ORDER — SENNA 8.6 MG PO TABS
ORAL_TABLET | ORAL | Status: AC
  Filled 2022-01-18: qty 1

## 2022-01-18 MED ORDER — GABAPENTIN 300 MG PO CAPS
ORAL_CAPSULE | ORAL | Status: AC
  Administered 2022-01-18: 400 mg
  Filled 2022-01-18: qty 1

## 2022-01-18 MED ORDER — GLYCOPYRROLATE 0.2 MG/ML IJ SOLN
INTRAMUSCULAR | Status: DC | PRN
  Administered 2022-01-18: .2 mg via INTRAVENOUS

## 2022-01-18 MED ORDER — BUPIVACAINE HCL (PF) 0.5 % IJ SOLN
INTRAMUSCULAR | Status: AC
  Filled 2022-01-18: qty 30

## 2022-01-18 MED ORDER — VANCOMYCIN HCL 1000 MG IV SOLR
INTRAVENOUS | Status: AC
  Filled 2022-01-18: qty 20

## 2022-01-18 MED ORDER — PROPOFOL 10 MG/ML IV BOLUS
INTRAVENOUS | Status: DC | PRN
  Administered 2022-01-18: 30 mg via INTRAVENOUS
  Administered 2022-01-18: 50 mg via INTRAVENOUS
  Administered 2022-01-18: 120 mg via INTRAVENOUS

## 2022-01-18 MED ORDER — PROPOFOL 500 MG/50ML IV EMUL
INTRAVENOUS | Status: DC | PRN
  Administered 2022-01-18: 100 ug/kg/min via INTRAVENOUS

## 2022-01-18 MED ORDER — GLIPIZIDE ER 2.5 MG PO TB24
2.5000 mg | ORAL_TABLET | Freq: Every day | ORAL | Status: DC
  Filled 2022-01-18 (×2): qty 1

## 2022-01-18 MED ORDER — BISOPROLOL FUMARATE 5 MG PO TABS
5.0000 mg | ORAL_TABLET | ORAL | Status: DC
  Filled 2022-01-18: qty 1

## 2022-01-18 MED ORDER — FENTANYL CITRATE (PF) 100 MCG/2ML IJ SOLN
25.0000 ug | INTRAMUSCULAR | Status: DC | PRN
  Administered 2022-01-18 (×4): 25 ug via INTRAVENOUS

## 2022-01-18 MED ORDER — LEVOTHYROXINE SODIUM 100 MCG PO TABS
100.0000 ug | ORAL_TABLET | Freq: Every day | ORAL | Status: DC
  Administered 2022-01-19: 100 ug via ORAL
  Filled 2022-01-18: qty 1

## 2022-01-18 MED ORDER — SUCCINYLCHOLINE CHLORIDE 200 MG/10ML IV SOSY
PREFILLED_SYRINGE | INTRAVENOUS | Status: DC | PRN
  Administered 2022-01-18: 100 mg via INTRAVENOUS

## 2022-01-18 MED ORDER — SODIUM CHLORIDE 0.9% FLUSH: 3.0000 mL | INTRAVENOUS | Status: DC | PRN

## 2022-01-18 MED ORDER — OXYCODONE HCL 5 MG PO TABS
ORAL_TABLET | ORAL | Status: AC
  Filled 2022-01-18: qty 2

## 2022-01-18 MED ORDER — KETOROLAC TROMETHAMINE 15 MG/ML IJ SOLN
7.5000 mg | Freq: Four times a day (QID) | INTRAMUSCULAR | Status: AC
  Administered 2022-01-18 – 2022-01-19 (×3): 7.5 mg via INTRAVENOUS

## 2022-01-18 MED ORDER — BISACODYL 10 MG RE SUPP: 10.0000 mg | Freq: Every day | RECTAL | Status: DC | PRN

## 2022-01-18 MED ORDER — FENTANYL CITRATE (PF) 100 MCG/2ML IJ SOLN
INTRAMUSCULAR | Status: AC
  Filled 2022-01-18: qty 2

## 2022-01-18 MED ORDER — 0.9 % SODIUM CHLORIDE (POUR BTL) OPTIME
TOPICAL | Status: DC | PRN
  Administered 2022-01-18: 1000 mL

## 2022-01-18 MED ORDER — VITAMIN D 25 MCG (1000 UNIT) PO TABS: 1000.0000 [IU] | ORAL_TABLET | Freq: Every day | ORAL | Status: DC

## 2022-01-18 MED ORDER — GABAPENTIN 300 MG PO CAPS
400.0000 mg | ORAL_CAPSULE | Freq: Two times a day (BID) | ORAL | Status: DC
  Administered 2022-01-19: 400 mg via ORAL

## 2022-01-18 MED ORDER — FUROSEMIDE 20 MG PO TABS
20.0000 mg | ORAL_TABLET | Freq: Two times a day (BID) | ORAL | Status: DC
  Administered 2022-01-18: 20 mg via ORAL

## 2022-01-18 MED ORDER — OXYCODONE HCL 5 MG PO TABS
10.0000 mg | ORAL_TABLET | ORAL | Status: DC | PRN
  Administered 2022-01-18: 10 mg via ORAL

## 2022-01-18 MED ORDER — MENTHOL 3 MG MT LOZG: 1.0000 | LOZENGE | OROMUCOSAL | Status: DC | PRN

## 2022-01-18 MED ORDER — FENTANYL CITRATE (PF) 100 MCG/2ML IJ SOLN
INTRAMUSCULAR | Status: DC | PRN
  Administered 2022-01-18 (×2): 50 ug via INTRAVENOUS

## 2022-01-18 MED ORDER — IRBESARTAN 150 MG PO TABS
150.0000 mg | ORAL_TABLET | Freq: Every day | ORAL | Status: DC
  Filled 2022-01-18 (×2): qty 1

## 2022-01-18 MED ORDER — SURGIFLO WITH THROMBIN (HEMOSTATIC MATRIX KIT) OPTIME
TOPICAL | Status: DC | PRN
  Administered 2022-01-18: 1 via TOPICAL

## 2022-01-18 MED ORDER — POLYETHYLENE GLYCOL 3350 17 G PO PACK: 17.0000 g | PACK | Freq: Every day | ORAL | Status: DC | PRN

## 2022-01-18 MED ORDER — PHENOL 1.4 % MT LIQD: 1.0000 | OROMUCOSAL | Status: DC | PRN

## 2022-01-18 MED ORDER — EPHEDRINE 5 MG/ML INJ
INTRAVENOUS | Status: AC
  Filled 2022-01-18: qty 5

## 2022-01-18 MED ORDER — PHENYLEPHRINE HCL-NACL 20-0.9 MG/250ML-% IV SOLN
INTRAVENOUS | Status: AC
  Filled 2022-01-18: qty 250

## 2022-01-18 MED ORDER — GABAPENTIN 100 MG PO CAPS
ORAL_CAPSULE | ORAL | Status: AC
  Filled 2022-01-18: qty 1

## 2022-01-18 MED ORDER — LIDOCAINE HCL (CARDIAC) PF 100 MG/5ML IV SOSY
PREFILLED_SYRINGE | INTRAVENOUS | Status: DC | PRN
  Administered 2022-01-18: 100 mg via INTRATRACHEAL

## 2022-01-18 MED ORDER — HYDRALAZINE HCL 50 MG PO TABS
ORAL_TABLET | ORAL | Status: AC
  Administered 2022-01-18: 100 mg
  Filled 2022-01-18: qty 2

## 2022-01-18 MED ORDER — EPHEDRINE SULFATE (PRESSORS) 50 MG/ML IJ SOLN
INTRAMUSCULAR | Status: DC | PRN
  Administered 2022-01-18: 5 mg via INTRAVENOUS
  Administered 2022-01-18 (×2): 10 mg via INTRAVENOUS
  Administered 2022-01-18 (×2): 5 mg via INTRAVENOUS

## 2022-01-18 MED ORDER — ROSUVASTATIN CALCIUM 5 MG PO TABS
5.0000 mg | ORAL_TABLET | Freq: Every day | ORAL | Status: DC
  Filled 2022-01-18: qty 1

## 2022-01-18 MED ORDER — VALSARTAN-HYDROCHLOROTHIAZIDE 160-12.5 MG PO TABS: 1.0000 | ORAL_TABLET | Freq: Every day | ORAL | Status: DC

## 2022-01-18 MED ORDER — HYDROMORPHONE HCL 1 MG/ML IJ SOLN
0.5000 mg | INTRAMUSCULAR | Status: DC | PRN
  Administered 2022-01-18: 0.5 mg via INTRAVENOUS

## 2022-01-18 MED ORDER — KETOROLAC TROMETHAMINE 30 MG/ML IJ SOLN
INTRAMUSCULAR | Status: AC
  Filled 2022-01-18: qty 1

## 2022-01-18 MED ORDER — ACETAMINOPHEN 500 MG PO TABS
ORAL_TABLET | ORAL | Status: AC
  Filled 2022-01-18: qty 2

## 2022-01-18 MED ORDER — PHENYLEPHRINE HCL-NACL 20-0.9 MG/250ML-% IV SOLN
INTRAVENOUS | Status: DC | PRN
  Administered 2022-01-18: 50 ug/min via INTRAVENOUS

## 2022-01-18 MED ORDER — MORPHINE SULFATE (PF) 2 MG/ML IV SOLN: 2.0000 mg | INTRAVENOUS | Status: DC | PRN

## 2022-01-18 MED ORDER — SODIUM CHLORIDE (PF) 0.9 % IJ SOLN
INTRAMUSCULAR | Status: DC | PRN
  Administered 2022-01-18: 60 mL

## 2022-01-18 MED ORDER — BUPIVACAINE LIPOSOME 1.3 % IJ SUSP
INTRAMUSCULAR | Status: AC
  Filled 2022-01-18: qty 20

## 2022-01-18 MED ORDER — ACETAMINOPHEN 10 MG/ML IV SOLN
INTRAVENOUS | Status: DC | PRN
  Administered 2022-01-18: 1000 mg via INTRAVENOUS

## 2022-01-18 MED ORDER — SODIUM CHLORIDE 0.9 % IV SOLN
INTRAVENOUS | Status: DC | PRN
  Administered 2022-01-18: .1 ug/kg/min via INTRAVENOUS

## 2022-01-18 MED ORDER — PANTOPRAZOLE SODIUM 40 MG PO TBEC: 80.0000 mg | DELAYED_RELEASE_TABLET | Freq: Every day | ORAL | Status: DC

## 2022-01-18 MED ORDER — OXYCODONE HCL 5 MG PO TABS: 5.0000 mg | ORAL_TABLET | ORAL | Status: DC | PRN

## 2022-01-18 MED ORDER — ONDANSETRON HCL 4 MG/2ML IJ SOLN: 4.0000 mg | Freq: Once | INTRAMUSCULAR | Status: DC | PRN

## 2022-01-18 MED ORDER — ENOXAPARIN SODIUM 40 MG/0.4ML IJ SOSY: 40.0000 mg | PREFILLED_SYRINGE | INTRAMUSCULAR | Status: DC

## 2022-01-18 MED ORDER — DOCUSATE SODIUM 100 MG PO CAPS
100.0000 mg | ORAL_CAPSULE | Freq: Two times a day (BID) | ORAL | Status: DC
  Administered 2022-01-18: 100 mg via ORAL

## 2022-01-18 MED ORDER — REMIFENTANIL HCL 1 MG IV SOLR
INTRAVENOUS | Status: AC
  Filled 2022-01-18: qty 1000

## 2022-01-18 MED ORDER — FERROUS SULFATE 325 (65 FE) MG PO TABS: 324.0000 mg | ORAL_TABLET | Freq: Every day | ORAL | Status: DC

## 2022-01-18 MED ORDER — METHOCARBAMOL 500 MG PO TABS: 500.0000 mg | ORAL_TABLET | Freq: Four times a day (QID) | ORAL | Status: DC | PRN

## 2022-01-18 MED ORDER — DEXAMETHASONE SODIUM PHOSPHATE 10 MG/ML IJ SOLN
INTRAMUSCULAR | Status: DC | PRN
  Administered 2022-01-18: 5 mg via INTRAVENOUS

## 2022-01-18 MED ORDER — ACETAMINOPHEN 500 MG PO TABS
1000.0000 mg | ORAL_TABLET | Freq: Four times a day (QID) | ORAL | Status: DC
  Administered 2022-01-18 – 2022-01-19 (×3): 1000 mg via ORAL

## 2022-01-18 MED ORDER — PHENYLEPHRINE HCL (PRESSORS) 10 MG/ML IV SOLN
INTRAVENOUS | Status: DC | PRN
  Administered 2022-01-18: 80 ug via INTRAVENOUS

## 2022-01-18 MED ORDER — ALLOPURINOL 100 MG PO TABS
100.0000 mg | ORAL_TABLET | Freq: Every day | ORAL | Status: DC
  Administered 2022-01-19: 100 mg via ORAL
  Filled 2022-01-18: qty 1

## 2022-01-18 MED ORDER — HYDRALAZINE HCL 50 MG PO TABS
100.0000 mg | ORAL_TABLET | Freq: Two times a day (BID) | ORAL | Status: DC
  Administered 2022-01-18: 100 mg via ORAL

## 2022-01-18 MED ORDER — SODIUM CHLORIDE 0.9 % IV SOLN: 250.0000 mL | INTRAVENOUS | Status: DC

## 2022-01-18 MED ORDER — BUPIVACAINE-EPINEPHRINE (PF) 0.5% -1:200000 IJ SOLN
INTRAMUSCULAR | Status: AC
  Filled 2022-01-18: qty 30

## 2022-01-18 MED ORDER — SENNA 8.6 MG PO TABS
1.0000 | ORAL_TABLET | Freq: Two times a day (BID) | ORAL | Status: DC
  Administered 2022-01-18: 8.6 mg via ORAL

## 2022-01-18 MED ORDER — DEXAMETHASONE SODIUM PHOSPHATE 10 MG/ML IJ SOLN
INTRAMUSCULAR | Status: AC
  Filled 2022-01-18: qty 1

## 2022-01-18 SURGICAL SUPPLY — 91 items
ALLOGRAFT BONESTRIP KORE 2.5X5 (Bone Implant) IMPLANT
BASIN KIT SINGLE STR (MISCELLANEOUS) ×1 IMPLANT
BUR NEURO DRILL SOFT 3.0X3.8M (BURR) IMPLANT
CAP LOCKING SPINE (Cap) IMPLANT
CATH FOLEY SIL 2WAY 14FR5CC (CATHETERS) IMPLANT
CHLORAPREP W/TINT 26 (MISCELLANEOUS) ×4 IMPLANT
CORD BIP STRL DISP 12FT (MISCELLANEOUS) ×1 IMPLANT
CORD LIGHT LATERIAL X LIFT (MISCELLANEOUS) IMPLANT
COUNTER NEEDLE 20/40 LG (NEEDLE) ×2 IMPLANT
COVER BACK TABLE REUSABLE LG (DRAPES) ×1 IMPLANT
CUP MEDICINE 2OZ PLAST GRAD ST (MISCELLANEOUS) ×1 IMPLANT
DERMABOND ADVANCED .7 DNX12 (GAUZE/BANDAGES/DRESSINGS) ×3 IMPLANT
DRAPE 3D C-ARM OEC (DRAPES) IMPLANT
DRAPE C ARM PK CFD 31 SPINE (DRAPES) ×1 IMPLANT
DRAPE C-ARMOR (DRAPES) IMPLANT
DRAPE INCISE IOBAN 66X45 STRL (DRAPES) ×1 IMPLANT
DRAPE LAPAROTOMY 100X77 ABD (DRAPES) ×2 IMPLANT
DRAPE POUCH INSTRU U-SHP 10X18 (DRAPES) IMPLANT
DRAPE SCAN PATIENT (DRAPES) ×1 IMPLANT
DRAPE SURG 17X11 SM STRL (DRAPES) ×2 IMPLANT
DRSG OPSITE POSTOP 3X4 (GAUZE/BANDAGES/DRESSINGS) IMPLANT
DRSG OPSITE POSTOP 4X6 (GAUZE/BANDAGES/DRESSINGS) IMPLANT
DRSG TEGADERM 2-3/8X2-3/4 SM (GAUZE/BANDAGES/DRESSINGS) IMPLANT
DRSG TEGADERM 4X4.75 (GAUZE/BANDAGES/DRESSINGS) IMPLANT
DRSG TEGADERM 6X8 (GAUZE/BANDAGES/DRESSINGS) IMPLANT
ELECT CAUTERY BLADE TIP 2.5 (TIP) ×1
ELECT EZSTD 165MM 6.5IN (MISCELLANEOUS) ×1
ELECT REM PT RETURN 9FT ADLT (ELECTROSURGICAL) ×2
ELECTRODE CAUTERY BLDE TIP 2.5 (TIP) ×1 IMPLANT
ELECTRODE EZSTD 165MM 6.5IN (MISCELLANEOUS) ×1 IMPLANT
ELECTRODE REM PT RTRN 9FT ADLT (ELECTROSURGICAL) ×2 IMPLANT
EX-PIN ORTHOLOCK NAV 4X150 (PIN) IMPLANT
FEE INTRAOP CADWELL SUPPLY NCS (MISCELLANEOUS) ×1 IMPLANT
FEE INTRAOP MONITOR IMPULS NCS (MISCELLANEOUS) ×1 IMPLANT
FORCEPS BPLR BAYO 10IN 1.0TIP (ORTHOPEDIC DISPOSABLE SUPPLIES) IMPLANT
GAUZE 4X4 16PLY ~~LOC~~+RFID DBL (SPONGE) ×1 IMPLANT
GLOVE BIOGEL PI IND STRL 6.5 (GLOVE) ×3 IMPLANT
GLOVE SURG SYN 6.5 ES PF (GLOVE) ×5 IMPLANT
GLOVE SURG SYN 6.5 PF PI (GLOVE) ×5 IMPLANT
GLOVE SURG SYN 8.5  E (GLOVE) ×6
GLOVE SURG SYN 8.5 E (GLOVE) ×6 IMPLANT
GLOVE SURG SYN 8.5 PF PI (GLOVE) ×6 IMPLANT
GOWN SRG LRG LVL 4 IMPRV REINF (GOWNS) ×2 IMPLANT
GOWN SRG XL LVL 3 NONREINFORCE (GOWNS) ×2 IMPLANT
GOWN STRL NON-REIN TWL XL LVL3 (GOWNS) ×2
GOWN STRL REIN LRG LVL4 (GOWNS) ×2
GRADUATE 1200CC STRL 31836 (MISCELLANEOUS) ×1 IMPLANT
HOLDER FOLEY CATH W/STRAP (MISCELLANEOUS) ×1 IMPLANT
INTRAOP CADWELL SUPPLY FEE NCS (MISCELLANEOUS) ×1
INTRAOP DISP SUPPLY FEE NCS (MISCELLANEOUS) ×1
INTRAOP MONITOR FEE IMPULS NCS (MISCELLANEOUS) ×1
INTRAOP MONITOR FEE IMPULSE (MISCELLANEOUS) ×1
JET LAVAGE IRRISEPT WOUND (IRRIGATION / IRRIGATOR) ×1
K-WIRE 1.6 NITINOL SHARP TIP (WIRE) ×4
KIT DISP MARS 3V (KITS) IMPLANT
KIT PEDICLE ACCESS (KITS) IMPLANT
KIT SPINAL PRONEVIEW (KITS) ×1 IMPLANT
KIT TURNOVER KIT A (KITS) ×1 IMPLANT
KNIFE BAYONET SHORT DISCETOMY (MISCELLANEOUS) IMPLANT
KWIRE 1.6 NITINOL SHARP TIP (WIRE) IMPLANT
LAVAGE JET IRRISEPT WOUND (IRRIGATION / IRRIGATOR) IMPLANT
MANIFOLD NEPTUNE II (INSTRUMENTS) ×2 IMPLANT
MARKER SKIN DUAL TIP RULER LAB (MISCELLANEOUS) ×3 IMPLANT
MARKER SPHERE PSV REFLC 13MM (MARKER) ×7 IMPLANT
NDL SAFETY ECLIP 18X1.5 (MISCELLANEOUS) ×1 IMPLANT
PACK LAMINECTOMY NEURO (CUSTOM PROCEDURE TRAY) ×1 IMPLANT
PAD ARMBOARD 7.5X6 YLW CONV (MISCELLANEOUS) ×1 IMPLANT
ROD SPINE CURVE 5.5X50 (Rod) IMPLANT
SCREW CREO 6.5X45 FEN (Screw) IMPLANT
SCREW CREO 6.5X50 (Screw) IMPLANT
SCREW CREO MIS 30 TULIP (Screw) IMPLANT
SOLUTION IRRIG SURGIPHOR (IV SOLUTION) ×1 IMPLANT
SPACER HEDRON 10D 11X18X55 (Spacer) IMPLANT
SPONGE GAUZE 2X2 8PLY STRL LF (GAUZE/BANDAGES/DRESSINGS) IMPLANT
STAPLER SKIN PROX 35W (STAPLE) IMPLANT
SURGIFLO W/THROMBIN 8M KIT (HEMOSTASIS) ×1 IMPLANT
SUT DVC VLOC 3-0 CL 6 P-12 (SUTURE) ×1 IMPLANT
SUT ETHILON 3-0 FS-10 30 BLK (SUTURE)
SUT VIC AB 0 CT1 27 (SUTURE) ×3
SUT VIC AB 0 CT1 27XCR 8 STRN (SUTURE) ×1 IMPLANT
SUT VIC AB 2-0 CT1 18 (SUTURE) ×1 IMPLANT
SUTURE EHLN 3-0 FS-10 30 BLK (SUTURE) IMPLANT
SYR 30ML LL (SYRINGE) ×2 IMPLANT
TOWEL OR 17X26 4PK STRL BLUE (TOWEL DISPOSABLE) ×5 IMPLANT
TRAP FLUID SMOKE EVACUATOR (MISCELLANEOUS) ×1 IMPLANT
TRAY FOLEY MTR SLVR 16FR STAT (SET/KITS/TRAYS/PACK) IMPLANT
TROCAR INSERT W/PEDICLE NDL (TROCAR) IMPLANT
TROCAR INSERT W/PEDICLE NDLE (TROCAR)
TUBING CONNECTING 10 (TUBING) ×2 IMPLANT
WATER STERILE IRR 1000ML POUR (IV SOLUTION) ×2 IMPLANT
WATER STERILE IRR 500ML POUR (IV SOLUTION) IMPLANT

## 2022-01-18 NOTE — Op Note (Signed)
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   Preoperative Note:   Risks of surgery discussed include: infection, bleeding, stroke, coma, death, paralysis, CSF leak, nerve/spinal cord injury, numbness, tingling, weakness, complex regional pain syndrome, recurrent stenosis and/or disc herniation, vascular injury, development of instability, neck/back pain, need for further surgery, persistent symptoms, development of deformity, and the risks of anesthesia. The patient understood these risks and agreed to proceed.  NAME OF ANTERIOR PROCEDURE:               1. Anterior lumbar interbody fusion via a right lateral retroperitoneal approach at L4/5 2. Placement of a Lordotic  Globus Hedron interbody cage, filled with Demineralized Bone Matrix  NAME OF POSTERIOR PROCEDURE 1. Posterior instrumentation using Globus Creo Instrumentation 2. Posterolateral fusion, L4/5, utilizing demineralized bone matrix 3. Use of Stereotaxis   PROCEDURE:  Patient was brought to the operating room, intubated, turned to the lateral position.  All pressure points were checked and double-checked.  The patient was prepped and draped in the standard fashion. Prior to prepping, fluoroscopy was brought in and the patient was positioned with a large bump under the contralateral side between the iliac crest and rib cage,  allowing the area between the iliac crest and the lateral aspect of the rib cage to open and increase the ability to reach inferiorly, to facilitate entry into the disc space.  The incision was marked upon the skin both the location of the disc space as well as the superior most aspect of the iliac crest.  Based on the identification of the disc space an incision was prepared, marked upon the skin and eventually was used for our lateral incision.  The fluoroscopy was turned into a cross table A/P image in order to confirm that the patient's spine remained in a perpendicular trajectory to the floor without rotation.  Once confirming that all the pressure points were checked and double-checked and the patient remained in sturdy position strapped down in this slightly jack-knifed lateral position, the patient was prepped and draped in standard fashion.  The skin was injected with local anesthetic, then incised until the abdominal wall fascia was noted.  I bluntly dissected posteriorly until we were able to identify the posterior musculature near petit's triangle.  At this point, using primarily blunt dissection with our finger aided with a metzenbaum scissor, were able to enter the retroperitoneal cavity.  The retroperitoneal potential space was opened further until palpating out the psoas muscle, the medial aspect of the iliac crest, the medial aspect of the last rib and continued to define the retroperitoneal space with blunt dissection in order to facilitate safe placement of our dilators.    While protecting by dissecting directly onto a finger in the retroperitoneum, the retroperitoneal space was entered safely from the lateral incision and the initial dilator placed onto the muscle belly of the psoas.  While directly stimulating the dilator and after radiographically confirming our location relative to the disc space, I placed  the dilator through the psoas.  The dilators were stimulated to ensure remaining  safely away from any of the lumbar plexus nerves; the dilators were repositioned until no pathologic stimulation was appreciated.  Once I had confirmed the location of our initial dilator radiographically, a K-wire secured the dilator into the L4/5 disc space and confirmed position under A/P and lateral fluoroscopy.  At this point, I dilated up with direct stimulation to confirm lack of pathologic stimulation.  Once all the dilators were in position, I placed in the retractor and secured it onto the table, locked into position and confirmed under A/P and lateral fluoroscopy to confirm our approach angle to the disc space as well as location relative to the disc space.  I then placed the muscle stimulator in through the working channel down to the vertebral body, stimulating the entire lateral surface of the vertebral body and any of the visualized psoas muscle that was adjacent to the retractor, confirming again the safe passage to the psoas before we began performing the discectomy.  At this point, we began our discectomy at L4/5.  The disc was incised laterally throughout the extent of our exposure. Using a combination of pituitary rongeurs, Kerrison rongeurs, rasps, curettes of various sorts, we were able to begin to clean out the disc space.  Once we had cleaned out the majority of the disc space, we then cut the lateral annulus with a cob, breaking the lateral annual attachments on the contralateral side by subtly working the cob through the annulus while using flouroscopy.  Care was taken not to extend further than required after cutting the annular attachments.  After this had been performed, we prepared the endplates for placement of our graft, sized a graft to the disc space by serially dilating up in trial sizes until we confirmed that our graft would be well positioned, allowing distraction while maintaining good grip.  This was confirmed under A/P and lateral fluoroscopy in order to ensure its  placement as an eventual trial for placement of our final graft.  We irrigated with bacteriostatic saline.  Once confirmed placement, the Hedron implant filled with allograft was impacted into position at L4/5.   Through a combination of intradiscal distraction and anterior releasing, we were able to correct the anterior deformity during disc preparation and placement of the graft.  At this point, final radiographs were performed, and we began closure.  The wound was closed using 0 Vicryl interrupted suture in the fascia and 2-0 Vicryl inverted suture were placed in the subcutaneous tissue and dermis. 3-0 monocryl was used for final closure. Dermabond was used to close the skin.    After closing the anterior part in layers, the patient was repositioned into prone position.  All pressure points were checked and double-checked.  The posterior operative site was prepped and draped in standard fashion.  The stereotactic array was placed.  Stereotactic images were acquired using intraoperative CT scanning.  This was registered to the patient.  Using stereotaxis, screw trajectories were planned and incisions made.  The pedicles from L4 to L5 were cannulated bilaterally and K wires used to secure the tracks.  We then utilized a stereotactic screwdriver to place pedicle screws from L4 to L5.  At L5, 6.5x67m Globus Creo pedicle screws were placed. 6.5x50 were placed at L4.  Once the screws were placed, the screw extensions were then linked, a path was formed for the rod and a rod was utilized to connect the screws.  We  then compressed, torqued / counter-torqued and removed the screw assembly. Once performed on each side, the C-arm was brought back and to take confirmatory CT scan showing appropriate placement of all instrumentation and anatomic alignment.    Posterolateral arthrodesis was performed at L4-L5 utilizing demineralized bone matrix.  Again we confirmed radiographically and began our closure.  The  wound was closed using 0 Vicryl interrupted suture in the fascia, 2-0 Vicryl inverted suture were placed in the subcutaneous tissue and dermis. 3-0 monocryl was used for final closure. Dermabond was used to close the skin.    Needle, lap and all counts were correct at the end of the case.    There was no pathologic change in the neuromonitoring during the procedure.   Cooper Render PA assisted in the entire procedure. An assistant was required for this procedure due to the complexity.  The assistant provided assistance in tissue manipulation and suction, and was required for the successful and safe performance of the procedure. I performed the critical portions of the procedure.   Meade Maw MD Neurosurgery

## 2022-01-18 NOTE — Transfer of Care (Signed)
Immediate Anesthesia Transfer of Care Note  Patient: Jesse Thompson  Procedure(s) Performed: L4-5 LATERAL LUMBAR INTERBODY FUSION AND POSTERIOR SPINAL FUSION APPLICATION OF INTRAOPERATIVE CT SCAN  Patient Location: PACU  Anesthesia Type:General  Level of Consciousness: awake, drowsy, and patient cooperative  Airway & Oxygen Therapy: Patient Spontanous Breathing and Patient connected to face mask oxygen  Post-op Assessment: Report given to RN and Post -op Vital signs reviewed and stable  Post vital signs: Reviewed and stable  Last Vitals:  Vitals Value Taken Time  BP 172/88 01/18/22 1634  Temp 35.9 C 01/18/22 1634  Pulse 71 01/18/22 1636  Resp 16 01/18/22 1636  SpO2 100 % 01/18/22 1636  Vitals shown include unvalidated device data.  Last Pain:  Vitals:   01/18/22 1200  TempSrc: Oral  PainSc: 0-No pain         Complications: No notable events documented.

## 2022-01-18 NOTE — H&P (Signed)
Referring Physician:  No referring provider defined for this encounter.  Primary Physician:  Jesse Hector, MD  History of Present Illness: 01/18/2022 Jesse Thompson is here today with a chief complaint of back and leg pain as described below.  12/03/2021 Mr. Jesse Thompson is here today with a chief complaint of  low back pain that radiates into the left buttock, lateral thigh and lateral calf with associated numbness of the foot.  He has been having pain for 3 months.  He reports tingling and numbness that goes down his left leg on the lateral aspect of his thigh and lower leg prickly when he stands or walks.  He gets pain as bad as 10 out of 10.  Nothing really helps.  He also has significant back pain.     Bowel/Bladder Dysfunction: none   Conservative measures:  Physical therapy: has participated in 2 visits at [facility] on 11/09/21 and 11/18/21 but it was making the pain worse.  Multimodal medical therapy including regular antiinflammatories: gabapentin, meloxicam,  Injections: has received epidural steroid injections 10/28/2021: Left L4-5 and left L5-S1 transforaminal ESI (no relief, dexamethasone 12 mg) 09/23/2021: Left L5-S1 transforaminal ESI (4 hours of relief)   Past Surgery: denies   Jesse Thompson has no symptoms of cervical myelopathy.   The symptoms are causing a significant impact on the patient's life.   Review of Systems:  A 10 point review of systems is negative, except for the pertinent positives and negatives detailed in the HPI.  Past Medical History: Past Medical History:  Diagnosis Date   (HFpEF) heart failure with preserved ejection fraction ([place])    a.) TTE 01/13/2017: EF >55%, BAE, triv AR, mild TR/PR, G2DD; b.) TTE 12/06/2017: EF >55%, BAE, triv AR/PR, mild MR/TR, G1DD; c.) TTE 07/24/2019: EF >55%, LAE, triv AR/PR, mild MR/TR, G2DD; d.) TTE 07/29/2020: EF 50%, mild LVH, mod LAE, mild MR/TR   Anemia    Aortic atherosclerosis (HCC)     Bursitis    CKD (chronic kidney disease), stage III (HCC)    Coronary artery disease    a.) R/LHC/PCI 09/13/2019: EF 50-55%. 75% pLAD (2.25 x 15 mm Resolute Onyx DES), 100% D1, 75% mLAD (2.25 x 15 mm Resolute Onyx DES), 25% pLCx, tandem 50% and 90% OM1 (2.5 x 22 mm Resolute Onyx DES). mPA 18, mPCWP 10, LVEDP 12, PA sat 82, AO sat 100, PVR 76, CO 8.4; b.) LHC 10/27/2020: 25 pLCx, 100% D1, 75% RPDA-1, 99% RPDA-2, 75% RPDA-3, 25% m-dRCA, min luminal irregs RCA - med mgmt.   DOE (dyspnea on exertion)    Esophageal stricture    a.) s/p dilitation x 2   GERD (gastroesophageal reflux disease)    Gout    History of Graves' disease    a.) s/p I-131 treatment in 08/2001 --> resulting clinical hypothyroidism; b.) s/p total thyroidectomy   HOH (hard of hearing)    Bilateral Hearing Aids   Hyperlipidemia    Hypertension    Hypothyroidism    NSTEMI (non-ST elevated myocardial infarction) ([place]) 09/13/2019   a.) PCI 09/10/2019: EF 50-55%. 75% pLAD (2.25 x 15 mm Resolute Onyx DES), 75% mLAD (2.25 x 15 mm Resolute Onyx DES), tandem 50% and 90% OM1 (2.5 x 22 mm Resolute Onyx DES)   OSA (obstructive sleep apnea)    a.) not using prescribed nocturnal PAP therapy   Osteoarthritis    Peripheral edema    Primary sclerosing cholangitis 2002   a.) Dx'd with ERCP  done at [facility] in 2002   Pulmonary nodules    Spondylolisthesis of lumbar region    T2DM (type 2 diabetes mellitus) ([place])    TIA (transient ischemic attack)    Vertigo     Past Surgical History: Past Surgical History:  Procedure Laterality Date   ANKLE FRACTURE SURGERY Right    BILE DUCT STENT PLACEMENT  2015   now out   [place] Right 09/15/2016   Procedure: CARPAL TUNNEL RELEASE;  Surgeon: Earnestine Leys, MD;  Location: [facility] ORS;  Service: Orthopedics;  Laterality: Right;   CARPAL TUNNEL RELEASE Left 09/29/2016   Procedure: CARPAL TUNNEL RELEASE;  Surgeon: Earnestine Leys, MD;  Location: [facility] ORS;  Service: Orthopedics;   Laterality: Left;  sutures removed right hand from status post carpal tunnel   COLONOSCOPY WITH ESOPHAGOGASTRODUODENOSCOPY (EGD)     CORONARY STENT INTERVENTION N/A 09/13/2019   Procedure: CORONARY STENT INTERVENTION;  Surgeon: Yolonda Kida, MD;  Location: [place] [facility];  Service: Cardiovascular;  Laterality: N/A;  LAD   LEFT HEART CATH AND CORONARY ANGIOGRAPHY Left 10/27/2020   Procedure: LEFT HEART CATH AND CORONARY ANGIOGRAPHY;  Surgeon: Yolonda Kida, MD;  Location: [place] [facility];  Service: Cardiovascular;  Laterality: Left;   PARATHYROIDECTOMY / EXPLORATION OF PARATHYROIDS  12/26/2015   RIGHT/LEFT HEART CATH AND CORONARY ANGIOGRAPHY N/A 09/13/2019   Procedure: RIGHT/LEFT HEART CATH AND CORONARY ANGIOGRAPHY;  Surgeon: Yolonda Kida, MD;  Location: [place] [facility];  Service: Cardiovascular;  Laterality: N/A;   TOTAL THYROIDECTOMY      Allergies: Allergies as of 12/16/2021 - Review Complete 12/03/2021  Allergen Reaction Noted   Metronidazole Itching 12/26/2012   Penicillin g Rash 12/26/2012    Medications: Current Meds  Medication Sig   allopurinol (ZYLOPRIM) 100 MG tablet Take 100 mg by mouth daily.   bisoprolol (ZEBETA) 5 MG tablet Take 5 mg by mouth every other day.   famotidine (PEPCID) 20 MG tablet Take 20 mg by mouth at bedtime.   ferrous sulfate 324 MG TBEC Take 324 mg by mouth.   furosemide (LASIX) 20 MG tablet Take 20 mg by mouth 2 (two) times daily. May take an extra tab daily if needed   gabapentin (NEURONTIN) 400 MG capsule Take 1 capsule (400 mg total) by mouth 2 (two) times daily.   glipiZIDE (GLUCOTROL XL) 2.5 MG 24 hr tablet Take 2.5 mg by mouth daily.   hydrALAZINE (APRESOLINE) 100 MG tablet Take 100 mg by mouth 2 (two) times daily.   levothyroxine (SYNTHROID, LEVOTHROID) 100 MCG tablet Take 100 mcg by mouth daily before breakfast.   omeprazole (PRILOSEC) 40 MG capsule Take 40 mg by mouth daily before breakfast.    rosuvastatin  (CRESTOR) 5 MG tablet Take 5 mg by mouth daily.   valsartan-hydrochlorothiazide (DIOVAN-HCT) 160-12.5 MG tablet Take 1 tablet by mouth daily.    Social History: Social History   Tobacco Use   Smoking status: Never   Smokeless tobacco: Never  Vaping Use   Vaping Use: Never used  Substance Use Topics   Alcohol use: No    Alcohol/week: 0.0 standard drinks of alcohol   Drug use: No    Family Medical History: Family History  Problem Relation Age of Onset   Hypertension Mother    Hypertension Father     Physical Examination: Vitals:   01/18/22 1200  BP: (!) 165/74  Pulse: (!) 50  Resp: 16  Temp: 97.6 F (36.4 C)  SpO2: 100%   Heart  sounds normal no MRG. Chest Clear to Auscultation Bilaterally.  General: Patient is well developed, well nourished, calm, collected, and in no apparent distress. Attention to examination is appropriate.  Neck:   Supple.  Full range of motion.  Respiratory: Patient is breathing without any difficulty.   NEUROLOGICAL:     Awake, alert, oriented to person, place, and time.  Speech is clear and fluent. Fund of knowledge is appropriate.   Cranial Nerves: Pupils equal round and reactive to light.  Facial tone is symmetric.  Facial sensation is symmetric. Shoulder shrug is symmetric. Tongue protrusion is midline.  There is no pronator drift.  ROM of spine: full.    Strength: Side Biceps Triceps Deltoid Interossei Grip Wrist Ext. Wrist Flex.  R '5 5 5 5 5 5 5  '$ L '5 5 5 5 5 5 5   '$ Side Iliopsoas Quads Hamstring PF DF EHL  R '5 5 5 5 5 5  '$ L '5 5 5 5 5 5   '$ Reflexes are 2+ and symmetric at the biceps, triceps, brachioradialis, patella and achilles.   Hoffman's is absent.   Bilateral upper and lower extremity sensation is intact to light touch.    No evidence of dysmetria noted.  Gait is normal.     Medical Decision Making  Imaging: MRI L spine 09/05/21 Disc levels:   T12- L1: Unremarkable.   L1-L2: Disc narrowing and bulging.  Negative  facets.   L2-L3: Disc narrowing and far-lateral predominant bulging.   L3-L4: Disc narrowing and bulging with far-lateral predominant spurring. Right paracentral protrusion with mild mass effect on the right L4 nerve root   L4-L5: Degenerative facet spurring with anterolisthesis. The disc is narrowed and bulging with moderate spinal stenosis. Patent foramina.   L5-S1:Mild disc desiccation.   These results will be called to the ordering clinician or representative by the Radiologist Assistant, and communication documented in the PACS or [employer].   IMPRESSION: 1. Right retroperitoneal edema which may be related to bowel, please correlate with abdominal symptoms and consider abdominal CT. 2. Lumbar spine degeneration most notably at the L4-5 facets where there is marrow edema and anterolisthesis causing moderate spinal stenosis.     Electronically Signed   By: Jorje Guild M.D.   On: 09/07/2021 13:33  I have personally reviewed the images and agree with the above interpretation.  Assessment and Plan: Mr. Kramp is a pleasant 71 y.o. male with anterolisthesis of L4 and L5 with 9 mm on standing x-rays with only 3 mm on supine MRI scan.  This implies lumbar spinal instability.  He has severe facet arthrosis at L4-5 and at least moderate stenosis when he is laying down.   He has tried physical therapy and was discharged due to lack of improvement.  At this point, no further conservative management is indicated.  I have recommended L4-5 lateral lumbar interbody fusion with percutaneous fixation and fusion.    Thank you for involving me in the care of this patient.      Wilbert Hayashi K. Izora Ribas MD, [facility] Neurosurgery

## 2022-01-18 NOTE — Anesthesia Procedure Notes (Signed)
Procedure Name: Intubation Date/Time: 01/18/2022 1:51 PM  Performed by: Jonna Clark, CRNAPre-anesthesia Checklist: Patient identified, Patient being monitored, Timeout performed, Emergency Drugs available and Suction available Patient Re-evaluated:Patient Re-evaluated prior to induction Oxygen Delivery Method: Circle system utilized Preoxygenation: Pre-oxygenation with 100% oxygen Induction Type: IV induction Ventilation: Mask ventilation without difficulty Laryngoscope Size: 4 and McGraph Grade View: Grade I Tube type: Oral Tube size: 7.0 mm Number of attempts: 1 Airway Equipment and Method: Stylet Placement Confirmation: ETT inserted through vocal cords under direct vision, positive ETCO2 and breath sounds checked- equal and bilateral Secured at: 21 cm Tube secured with: Tape Dental Injury: Teeth and Oropharynx as per pre-operative assessment

## 2022-01-18 NOTE — Anesthesia Preprocedure Evaluation (Signed)
Anesthesia Evaluation  Patient identified by MRN, date of birth, ID band Patient awake    Reviewed: Allergy & Precautions, H&P , NPO status , Patient's Chart, lab work & pertinent test results, reviewed documented beta blocker date and time   Airway Mallampati: II  TM Distance: >3 FB Neck ROM: full    Dental  (+) Teeth Intact   Pulmonary sleep apnea and Continuous Positive Airway Pressure Ventilation    Pulmonary exam normal        Cardiovascular Exercise Tolerance: Poor hypertension, On Medications + CAD, + Past MI and + DOE  Normal cardiovascular exam Rhythm:regular Rate:Normal     Neuro/Psych Very poor historian TIA negative psych ROS   GI/Hepatic Neg liver ROS,GERD  Medicated,,  Endo/Other  diabetes, Well ControlledHypothyroidism    Renal/GU Renal disease  negative genitourinary   Musculoskeletal   Abdominal   Peds  Hematology  (+) Blood dyscrasia, anemia   Anesthesia Other Findings Past Medical History: No date: (HFpEF) heart failure with preserved ejection fraction ([place])     Comment:  a.) TTE 01/13/2017: EF >55%, BAE, triv AR, mild TR/PR,               G2DD; b.) TTE 12/06/2017: EF >55%, BAE, triv AR/PR, mild               MR/TR, G1DD; c.) TTE 07/24/2019: EF >55%, LAE, triv               AR/PR, mild MR/TR, G2DD; d.) TTE 07/29/2020: EF 50%, mild              LVH, mod LAE, mild MR/TR No date: Anemia No date: Aortic atherosclerosis (HCC) No date: Bursitis No date: CKD (chronic kidney disease), stage III ([place]) No date: Coronary artery disease     Comment:  a.) R/LHC/PCI 09/13/2019: EF 50-55%. 75% pLAD (2.25 x 15              mm Resolute Onyx DES), 100% D1, 75% mLAD (2.25 x 15 mm               Resolute Onyx DES), 25% pLCx, tandem 50% and 90% OM1 (2.5              x 22 mm Resolute Onyx DES). mPA 18, mPCWP 10, LVEDP 12,               PA sat 82, AO sat 100, PVR 76, CO 8.4; b.) LHC               10/27/2020: 25  pLCx, 100% D1, 75% RPDA-1, 99% RPDA-2, 75%              RPDA-3, 25% m-dRCA, min luminal irregs RCA - med mgmt. No date: DOE (dyspnea on exertion) No date: Esophageal stricture     Comment:  a.) s/p dilitation x 2 No date: GERD (gastroesophageal reflux disease) No date: Gout No date: History of Graves' disease     Comment:  a.) s/p I-131 treatment in 08/2001 --> resulting               clinical hypothyroidism; b.) s/p total thyroidectomy No date: HOH (hard of hearing)     Comment:  Bilateral Hearing Aids No date: Hyperlipidemia No date: Hypertension No date: Hypothyroidism 09/13/2019: NSTEMI (non-ST elevated myocardial infarction) ([place])     Comment:  a.) PCI 09/10/2019: EF 50-55%. 75% pLAD (2.25 x 15 mm  Resolute Onyx DES), 75% mLAD (2.25 x 15 mm Resolute Onyx               DES), tandem 50% and 90% OM1 (2.5 x 22 mm Resolute Onyx               DES) No date: OSA (obstructive sleep apnea)     Comment:  a.) not using prescribed nocturnal PAP therapy No date: Osteoarthritis No date: Peripheral edema 2002: Primary sclerosing cholangitis     Comment:  a.) Dx'd with ERCP done at [facility] in 2002 No date: Pulmonary nodules No date: Spondylolisthesis of lumbar region No date: T2DM (type 2 diabetes mellitus) ([place]) No date: TIA (transient ischemic attack) No date: Vertigo Past Surgical History: No date: ANKLE FRACTURE SURGERY; Right 2015: BILE DUCT STENT PLACEMENT     Comment:  now out 09/15/2016: [place]; Right     Comment:  Procedure: CARPAL TUNNEL RELEASE;  Surgeon: Earnestine Leys, MD;  Location: [facility] ORS;  Service: Orthopedics;                Laterality: Right; 09/29/2016: CARPAL TUNNEL RELEASE; Left     Comment:  Procedure: CARPAL TUNNEL RELEASE;  Surgeon: Earnestine Leys, MD;  Location: [facility] ORS;  Service: Orthopedics;                Laterality: Left;  sutures removed right hand from status              post carpal tunnel No date:  COLONOSCOPY WITH ESOPHAGOGASTRODUODENOSCOPY (EGD) 09/13/2019: CORONARY STENT INTERVENTION; N/A     Comment:  Procedure: CORONARY STENT INTERVENTION;  Surgeon:               Yolonda Kida, MD;  Location: [place] [facility];               Service: Cardiovascular;  Laterality: N/A;  LAD 10/27/2020: LEFT HEART CATH AND CORONARY ANGIOGRAPHY; Left     Comment:  Procedure: LEFT HEART CATH AND CORONARY ANGIOGRAPHY;                Surgeon: Yolonda Kida, MD;  Location: [place]              [facility];  Service: Cardiovascular;  Laterality: Left; 12/26/2015: PARATHYROIDECTOMY / EXPLORATION OF PARATHYROIDS 09/13/2019: RIGHT/LEFT HEART CATH AND CORONARY ANGIOGRAPHY; N/A     Comment:  Procedure: RIGHT/LEFT HEART CATH AND CORONARY               ANGIOGRAPHY;  Surgeon: Yolonda Kida, MD;  Location:              [place] [facility];  Service: Cardiovascular;                Laterality: N/A; No date: TOTAL THYROIDECTOMY BMI    Body Mass Index: 28.90 kg/m     Reproductive/Obstetrics negative OB ROS                             Anesthesia Physical Anesthesia Plan  ASA: 2  Anesthesia Plan: General ETT   Post-op Pain Management:    Induction:   PONV Risk Score and Plan:   Airway Management Planned:   Additional Equipment:   Intra-op Plan:   Post-operative Plan:  Informed Consent: I have reviewed the patients History and Physical, chart, labs and discussed the procedure including the risks, benefits and alternatives for the proposed anesthesia with the patient or authorized representative who has indicated his/her understanding and acceptance.     Dental Advisory Given  Plan Discussed with: CRNA  Anesthesia Plan Comments:        Anesthesia Quick Evaluation

## 2022-01-19 MED ORDER — FUROSEMIDE 20 MG PO TABS
ORAL_TABLET | ORAL | Status: AC
  Administered 2022-01-19: 20 mg via ORAL
  Filled 2022-01-19: qty 1

## 2022-01-19 MED ORDER — PANTOPRAZOLE SODIUM 40 MG PO TBEC
DELAYED_RELEASE_TABLET | ORAL | Status: AC
  Administered 2022-01-19: 80 mg via ORAL
  Filled 2022-01-19: qty 2

## 2022-01-19 MED ORDER — HYDROCHLOROTHIAZIDE 25 MG PO TABS
ORAL_TABLET | ORAL | Status: AC
  Administered 2022-01-19: 12.5 mg via ORAL
  Filled 2022-01-19: qty 1

## 2022-01-19 MED ORDER — VITAMIN D 25 MCG (1000 UNIT) PO TABS
ORAL_TABLET | ORAL | Status: AC
  Administered 2022-01-19: 1000 [IU] via ORAL
  Filled 2022-01-19: qty 1

## 2022-01-19 MED ORDER — SENNA 8.6 MG PO TABS
ORAL_TABLET | ORAL | Status: AC
  Administered 2022-01-19: 8.6 mg via ORAL
  Filled 2022-01-19: qty 1

## 2022-01-19 MED ORDER — VITAMIN B-12 1000 MCG PO TABS
ORAL_TABLET | ORAL | Status: AC
  Administered 2022-01-19: 1000 ug via ORAL
  Filled 2022-01-19: qty 1

## 2022-01-19 MED ORDER — GABAPENTIN 300 MG PO CAPS
ORAL_CAPSULE | ORAL | Status: AC
  Filled 2022-01-19: qty 1

## 2022-01-19 MED ORDER — METHOCARBAMOL 500 MG PO TABS
ORAL_TABLET | ORAL | Status: AC
  Administered 2022-01-19: 500 mg via ORAL
  Filled 2022-01-19: qty 1

## 2022-01-19 MED ORDER — FERROUS SULFATE 325 (65 FE) MG PO TABS: 325.0000 mg | ORAL_TABLET | Freq: Every day | ORAL | Status: DC

## 2022-01-19 MED ORDER — KETOROLAC TROMETHAMINE 15 MG/ML IJ SOLN
INTRAMUSCULAR | Status: AC
  Filled 2022-01-19: qty 1

## 2022-01-19 MED ORDER — GABAPENTIN 100 MG PO CAPS
ORAL_CAPSULE | ORAL | Status: AC
  Filled 2022-01-19: qty 1

## 2022-01-19 MED ORDER — METHOCARBAMOL 500 MG PO TABS: 500.0000 mg | ORAL_TABLET | Freq: Four times a day (QID) | ORAL | 0 refills | Status: AC | PRN

## 2022-01-19 MED ORDER — FERROUS SULFATE 325 (65 FE) MG PO TABS
ORAL_TABLET | ORAL | Status: AC
  Administered 2022-01-19: 325 mg via ORAL
  Filled 2022-01-19: qty 1

## 2022-01-19 MED ORDER — HYDRALAZINE HCL 50 MG PO TABS
ORAL_TABLET | ORAL | Status: AC
  Administered 2022-01-19: 100 mg via ORAL
  Filled 2022-01-19: qty 2

## 2022-01-19 MED ORDER — KETOROLAC TROMETHAMINE 15 MG/ML IJ SOLN
INTRAMUSCULAR | Status: AC
  Administered 2022-01-19: 7.5 mg via INTRAVENOUS
  Filled 2022-01-19: qty 1

## 2022-01-19 MED ORDER — ENOXAPARIN SODIUM 40 MG/0.4ML IJ SOSY
PREFILLED_SYRINGE | INTRAMUSCULAR | Status: AC
  Administered 2022-01-19: 40 mg via SUBCUTANEOUS
  Filled 2022-01-19: qty 0.4

## 2022-01-19 MED ORDER — DOCUSATE SODIUM 100 MG PO CAPS
ORAL_CAPSULE | ORAL | Status: AC
  Administered 2022-01-19: 100 mg via ORAL
  Filled 2022-01-19: qty 1

## 2022-01-19 MED ORDER — METFORMIN HCL 500 MG PO TABS
ORAL_TABLET | ORAL | Status: AC
  Administered 2022-01-19: 1000 mg via ORAL
  Filled 2022-01-19: qty 2

## 2022-01-19 MED ORDER — ACETAMINOPHEN 500 MG PO TABS
ORAL_TABLET | ORAL | Status: AC
  Filled 2022-01-19: qty 2

## 2022-01-19 MED ORDER — MORPHINE SULFATE (PF) 2 MG/ML IV SOLN
INTRAVENOUS | Status: AC
  Administered 2022-01-19: 2 mg via INTRAVENOUS
  Filled 2022-01-19: qty 1

## 2022-01-19 MED ORDER — OXYCODONE HCL 5 MG PO TABS: 5.0000 mg | ORAL_TABLET | ORAL | 0 refills | Status: AC | PRN

## 2022-01-19 MED ORDER — SENNA 8.6 MG PO TABS: 1.0000 | ORAL_TABLET | Freq: Two times a day (BID) | ORAL | 0 refills | Status: AC

## 2022-01-19 NOTE — Progress Notes (Signed)
Progress Note  History: BING DUFFEY is s/p L4-5 XLIF.  POD1: Reports improved left leg pain this morning   Physical Exam: Vitals:   01/19/22 0000 01/19/22 0745  BP: (!) 140/82 128/76  Pulse:  65  Resp: 16 18  Temp: 98.2 F (36.8 C) 97.6 F (36.4 C)  SpO2: 98% 100%    AA Ox3. Sitting up in bed eating breakfast CNI  Strength:5/5 throughout BLE  Data:  Other tests/results: none   Assessment/Plan:  MERRIC YOST is a 71 y/o s/p XLIF with improved radiculopathy this morning  - mobilize - pain control - DVT prophylaxis - PTOT  Cooper Render PA-C Department of Neurosurgery

## 2022-01-19 NOTE — Discharge Instructions (Addendum)
  Your surgeon has performed an operation on your lumbar spine (low back) to relieve pressure on one or more nerves. Many times, patients feel better immediately after surgery and can "overdo it." Even if you feel well, it is important that you follow these activity guidelines. If you do not let your back heal properly from the surgery, you can increase the chance of a disc herniation and/or return of your symptoms. The following are instructions to help in your recovery once you have been discharged from the hospital.  Do not use NSAIDs for 3 months after surgery.   Activity    No bending, lifting, or twisting ("BLT"). Avoid lifting objects heavier than 10 pounds (gallon milk jug).  Where possible, avoid household activities that involve lifting, bending, pushing, or pulling such as laundry, vacuuming, grocery shopping, and childcare. Try to arrange for help from friends and family for these activities while your back heals.  Increase physical activity slowly as tolerated.  Taking short walks is encouraged, but avoid strenuous exercise. Do not jog, run, bicycle, lift weights, or participate in any other exercises unless specifically allowed by your doctor. Avoid prolonged sitting, including car rides.  Talk to your doctor before resuming sexual activity.  You should not drive until cleared by your doctor.  Until released by your doctor, you should not return to work or school.  You should rest at home and let your body heal.   You may shower two days after your surgery.  After showering, lightly dab your incision dry. Do not take a tub bath or go swimming for 3 weeks, or until approved by your doctor at your follow-up appointment.  If you smoke, we strongly recommend that you quit.  Smoking has been proven to interfere with normal healing in your back and will dramatically reduce the success rate of your surgery. Please contact [facility] (800-QUIT-NOW) and use the resources at www.[facility].com  for assistance in stopping smoking.  Surgical Incision   If you have a dressing on your incision, you may remove it three days after your surgery. Keep your incision area clean and dry.  Your incision was closed with Dermabond glue. The glue should begin to peel away within about a week.  Diet            You may return to your usual diet. Be sure to stay hydrated.  When to Contact us  Although your surgery and recovery will likely be uneventful, you may have some residual numbness, aches, and pains in your back and/or legs. This is normal and should improve in the next few weeks.  However, should you experience any of the following, contact us immediately: New numbness or weakness Pain that is progressively getting worse, and is not relieved by your pain medications or rest Bleeding, redness, swelling, pain, or drainage from surgical incision Chills or flu-like symptoms Fever greater than 101.0 F (38.3 C) Problems with bowel or bladder functions Difficulty breathing or shortness of breath Warmth, tenderness, or swelling in your calf  Contact Information During office hours (Monday-Friday 9 am to 5 pm), please call your physician at [phone] and ask for Berdine Addison After hours and weekends, please call [phone] and speak with the neurosurgeon on call For a life-threatening emergency, call 911

## 2022-01-19 NOTE — TOC Initial Note (Signed)
Transition of Care [facility]) - Initial/Assessment Note    Patient Details  Name: Jesse Thompson MRN: [id number] Date of Birth: Aug 27, 1950  [facility]) CM/SW Contact:    Conception Oms, RN Phone Number: 01/19/2022, 8:01 AM  Clinical Narrative:                 The patient is set up with [facility] for [facility] prior to surgery from Surgeons office        Patient Goals and CMS Choice        Expected Discharge Plan and Services                                                Prior Living Arrangements/Services                       Activities of Daily Living Home Assistive Devices/Equipment: Eyeglasses, Hearing aid ADL Screening (condition at time of admission) Patient's cognitive ability adequate to safely complete daily activities?: Yes Is the patient deaf or have difficulty hearing?: Yes (hearing aids) Does the patient have difficulty seeing, even when wearing glasses/contacts?: No Does the patient have difficulty concentrating, remembering, or making decisions?: No Patient able to express need for assistance with ADLs?: Yes Does the patient have difficulty dressing or bathing?: No Independently performs ADLs?: Yes (appropriate for developmental age) Does the patient have difficulty walking or climbing stairs?: No Weakness of Legs: Left Weakness of Arms/Hands: None  Permission Sought/Granted                  Emotional Assessment              Admission diagnosis:  S/P lumbar fusion [Z98.1] Patient Active Problem List   Diagnosis Date Noted   Spondylolisthesis of lumbar region 01/18/2022   Spinal instability, lumbar 01/18/2022   Chronic bilateral low back pain with left-sided sciatica 01/18/2022   S/P lumbar fusion 01/18/2022   Non-STEMI (non-ST elevated myocardial infarction) ([place]) 09/13/2019   S/P drug eluting coronary stent placement 09/13/2019   Sepsis ([place]) 11/04/2017   Osteoarthritis    PCP:  Adin Hector,  MD Pharmacy:   [facility] - [place], [address] [place] [address] Phone: [phone] Fax: [phone]  [id number] [place] [address]Phone: [phone]: [phone]    Social Determinants of Health (SDOH) Interventions    Readmission Risk Interventions     No data to display

## 2022-01-19 NOTE — Progress Notes (Signed)
Per Andee Poles, PA, changed dressing on lower back with honeycomb.

## 2022-01-19 NOTE — Evaluation (Addendum)
Occupational Therapy Evaluation Patient Details Name: Jesse Thompson MRN: [id number] DOB: May 06, 1950 Today's Date: 01/19/2022   History of Present Illness 71 y/o male s/p L4-5 XLIF 12/11.   Clinical Impression   Patient seen for OT evaluation. Prior to admission, pt was independent with ADL/IADLs and functional mobility without an AD. He was working full time. Pt currently functioning at Mod I for functional transfers without an AD. OT demonstrated use of LB dressing AE (reacher, sock aid). Pt able to complete LB dressing tasks with Mod I and no AE. Education provided for no BLT and log roll technique for bed mobility. Pt is close to baseline level of function with ADLs. Will keep on OT caseload to continue education on back precautions, energy conservation, and work on implementing precautions during ADL tasks. No follow up OT recommended at D/C.   Recommendations for follow up therapy are one component of a multi-disciplinary discharge planning process, led by the attending physician.  Recommendations may be updated based on patient status, additional functional criteria and insurance authorization.   Follow Up Recommendations  No OT follow up     Assistance Recommended at Discharge PRN  Patient can return home with the following Assist for transportation;Help with stairs or ramp for entrance;Assistance with cooking/housework;A little help with bathing/dressing/bathroom    Functional Status Assessment  Patient has had a recent decline in their functional status and demonstrates the ability to make significant improvements in function in a reasonable and predictable amount of time.  Equipment Recommendations  None recommended by OT    Recommendations for Other Services       Precautions / Restrictions Precautions Precautions: Fall Precaution Comments: no BLT, log roll for comfort Restrictions Weight Bearing Restrictions: No      Mobility Bed Mobility                General bed mobility comments: NT, received/left in recliner    Transfers Overall transfer level: Modified independent Equipment used: None               General transfer comment: STS from recliner      Balance Overall balance assessment: Modified Independent                                         ADL either performed or assessed with clinical judgement   ADL Overall ADL's : Needs assistance/impaired                                       General ADL Comments: Demonstrated use of LB dressing AE (reacher, sock aid). Pt able to complete dressing tasks with Mod I and no AE - Donned/doffed socks, donned/doffed mesh underwear. Anticipate assistance required for bathing (specifically reaching back) and posterior hygiene 2/2 back precaution of avoiding twisting movements.     Vision Baseline Vision/History: 1 Wears glasses Patient Visual Report: No change from baseline       Perception     Praxis      Pertinent Vitals/Pain Pain Assessment Pain Assessment: 0-10 Pain Score: 5  Pain Location: incision area and some down R LE Pain Descriptors / Indicators: Sore Pain Intervention(s): Monitored during session, Repositioned, Premedicated before session     Hand Dominance     Extremity/Trunk Assessment Upper Extremity Assessment Upper Extremity Assessment: Overall  WFL for tasks assessed   Lower Extremity Assessment Lower Extremity Assessment: Overall WFL for tasks assessed   Cervical / Trunk Assessment Cervical / Trunk Assessment: Back Surgery   Communication Communication Communication: No difficulties   Cognition Arousal/Alertness: Awake/alert Behavior During Therapy: WFL for tasks assessed/performed Overall Cognitive Status: Within Functional Limits for tasks assessed                                       General Comments  Pt moves well, reports L LE symptoms have greatly improved    Exercises Other  Exercises Other Exercises: OT provided education re: role of OT, OT POC, post acute recs, sitting up for all meals, EOB/OOB mobility with assistance, home/fall safety, LB dressing AE (reacher, sock aid), no BLT, log roll technique for bed mobility   Shoulder Instructions      Home Living Family/patient expects to be discharged to:: Private residence Living Arrangements: Spouse/significant other;Other relatives Available Help at Discharge: Available 24 hours/day;Family Type of Home: House Home Access: Ramped entrance     Home Layout: Able to live on main level with bedroom/bathroom     Bathroom Shower/Tub: Occupational psychologist: Handicapped height Bathroom Accessibility: Yes   Home Equipment: Rollator (4 wheels);Cane - single point;BSC/3in1;Shower seat;Wheelchair - manual;Toilet riser;Grab bars - tub/shower          Prior Functioning/Environment Prior Level of Function : Independent/Modified Independent;Working/employed;Driving             Mobility Comments: Independent no AD, denied history of falls past 6 months ADLs Comments: Independent ADLs/IADLs, still driving, 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)        OT Problem List: Pain;Decreased knowledge of precautions;Decreased range of motion;Decreased activity tolerance;Decreased knowledge of use of DME or AE      OT Treatment/Interventions: Self-care/ADL training;Therapeutic exercise;Therapeutic activities;Energy conservation;DME and/or AE instruction;Patient/family education;Balance training    OT Goals(Current goals can be found in the care plan section) Acute Rehab OT Goals Patient Stated Goal: return home OT Goal Formulation: With patient Time For Goal Achievement: 02/02/22 Potential to Achieve Goals: Good   OT Frequency: Min 2X/week    Co-evaluation              AM-PAC OT "6 Clicks" Daily Activity     Outcome Measure Help from another person eating meals?: None Help from  another person taking care of personal grooming?: None Help from another person toileting, which includes using toliet, bedpan, or urinal?: A Little Help from another person bathing (including washing, rinsing, drying)?: A Little Help from another person to put on and taking off regular upper body clothing?: None Help from another person to put on and taking off regular lower body clothing?: None 6 Click Score: 22   End of Session Nurse Communication: Mobility status  Activity Tolerance: Patient tolerated treatment well Patient left: in chair;with call bell/phone within reach  OT Visit Diagnosis: Pain;Other abnormalities of gait and mobility (R26.89) Pain - part of body:  (back)                Time: 8546-2703 OT Time Calculation (min): 20 min Charges:  OT General Charges $OT Visit: 1 Visit OT Evaluation $OT Eval Low Complexity: 1 Low  [facility] MS, OTR/L ascom [phone]  01/19/22, 12:17 PM

## 2022-01-19 NOTE — Evaluation (Signed)
Physical Therapy Evaluation Patient Details Name: KAVIAN PETERS MRN: [id number] DOB: 05/20/1950 Today's Date: 01/19/2022  History of Present Illness  71 y/o male s/p L4-5 XLIF 12/11.  Clinical Impression  Pt showed great effort and safety with PT exam.  He did not have any hesitation or need for assist with bed mobility or really with any aspects of mobility.  He was able to ambulate confidently with FWW and even did quite well with ~100 ft of ambulation w/o AD.  Reports L LE symptoms have essentially resolved now post surgery, having some LBP and R LE pain with active/resisted movement but functional and competent with all activity.       Recommendations for follow up therapy are one component of a multi-disciplinary discharge planning process, led by the attending physician.  Recommendations may be updated based on patient status, additional functional criteria and insurance authorization.  Follow Up Recommendations Follow physician's recommendations for discharge plan and follow up therapies      Assistance Recommended at Discharge Intermittent Supervision/Assistance  Patient can return home with the following  Assistance with cooking/housework;A little help with bathing/dressing/bathroom;Assist for transportation    Equipment Recommendations None recommended by PT  Recommendations for Other Services       Functional Status Assessment Patient has had a recent decline in their functional status and demonstrates the ability to make significant improvements in function in a reasonable and predictable amount of time.     Precautions / Restrictions Precautions Precautions:  (introduced spinal precautions) Restrictions Weight Bearing Restrictions: Yes      Mobility  Bed Mobility Overal bed mobility: Independent             General bed mobility comments: Pt easily and confidently able to get up to sitting EOB w/o assist    Transfers Overall transfer level: Modified  independent Equipment used: Rolling walker (2 wheels), None               General transfer comment: Pt able to rise to standing from multiple surfaces/heights all w/o assist and w/o hesitation.  Good safety and confidence    Ambulation/Gait Ambulation/Gait assistance: Modified independent (Device/Increase time) Gait Distance (Feet): 200 Feet Assistive device: Rolling walker (2 wheels), None         General Gait Details: Pt able to ambulate safely and confidently w/o issue. He reports some mild R LE hesitancy w/o buckling or safety issues.  Did encourage using 4WW or similar when he'll be up for extended periods or needs to "carry" something.  Stairs            Wheelchair Mobility    Modified Rankin (Stroke Patients Only)       Balance Overall balance assessment: Modified Independent                                           Pertinent Vitals/Pain Pain Assessment Pain Assessment: 0-10 Pain Score: 4  Pain Location: incision area and some down R LE    Home Living Family/patient expects to be discharged to:: Private residence Living Arrangements: Spouse/significant other;Other relatives Available Help at Discharge: Available 24 hours/day   Home Access: Ramped entrance       Home Layout: Able to live on main level with bedroom/bathroom Home Equipment: Rollator (4 wheels);Cane - single point;BSC/3in1;Shower seat;Wheelchair - manual      Prior Function Prior Level of Function :  Independent/Modified Independent                     Hand Dominance        Extremity/Trunk Assessment   Upper Extremity Assessment Upper Extremity Assessment: Overall WFL for tasks assessed    Lower Extremity Assessment Lower Extremity Assessment: Overall WFL for tasks assessed (minimally pain hesitant with R LE, grossly 4-/5 t/o; L LE grossly WFL)       Communication   Communication: No difficulties  Cognition Arousal/Alertness:  Awake/alert Behavior During Therapy: WFL for tasks assessed/performed Overall Cognitive Status: Within Functional Limits for tasks assessed                                          General Comments General comments (skin integrity, edema, etc.): Pt moves well, reports L LE symptoms have greatly improved    Exercises General Exercises - Lower Extremity Ankle Circles/Pumps: AROM, 5 reps Long Arc Quad: Strengthening, 5 reps Heel Slides: Strengthening, 5 reps Hip ABduction/ADduction: Strengthening, 5 reps   Assessment/Plan    PT Assessment All further PT needs can be met in the next venue of care  PT Problem List Decreased strength;Decreased activity tolerance;Decreased range of motion;Decreased mobility;Decreased balance;Decreased knowledge of use of DME;Decreased safety awareness;Pain       PT Treatment Interventions      PT Goals (Current goals can be found in the Care Plan section)  Acute Rehab PT Goals Patient Stated Goal: Go home PT Goal Formulation: With patient Time For Goal Achievement: 02/01/22 Potential to Achieve Goals: Good    Frequency       Co-evaluation               AM-PAC PT "6 Clicks" Mobility  Outcome Measure Help needed turning from your back to your side while in a flat bed without using bedrails?: None Help needed moving from lying on your back to sitting on the side of a flat bed without using bedrails?: None Help needed moving to and from a bed to a chair (including a wheelchair)?: A Little Help needed standing up from a chair using your arms (e.g., wheelchair or bedside chair)?: None Help needed to walk in hospital room?: A Little Help needed climbing 3-5 steps with a railing? : A Little 6 Click Score: 21    End of Session Equipment Utilized During Treatment: Gait belt Activity Tolerance: Patient tolerated treatment well Patient left: with bed alarm set;with call bell/phone within reach Nurse Communication: Mobility  status PT Visit Diagnosis: Pain;Difficulty in walking, not elsewhere classified (R26.2);Muscle weakness (generalized) (M62.81) Pain - Right/Left: Right Pain - part of body: Leg    Time: 8676-7209 PT Time Calculation (min) (ACUTE ONLY): 20 min   Charges:   PT Evaluation $PT Eval Low Complexity: 1 Low PT Treatments $Therapeutic Activity: 8-22 mins        Kreg Shropshire, DPT 01/19/2022, 11:02 AM

## 2022-01-19 NOTE — Discharge Summary (Signed)
Discharge Summary  Patient ID: Jesse Thompson MRN: [id number] DOB/AGE: 09/11/1950 71 y.o.  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  He underwent a L4-5 XLIF.  The operative course was uncomplicated and he was admitted for pain control and therapy evaluation.  He was seen by therapy and deemed appropriate for discharge home POD 1 with HH.  He was given prescriptions for oxycodone, Robaxin, and senna.  Consults: None  Significant Diagnostic Studies: none  Treatments: surgery: as above. Please see operative report for further details.  Discharge Exam: Blood pressure 124/67, pulse (!) 58, temperature 97.6 F (36.4 C), resp. rate 16, height '5\' 7"'$  (1.702 m), weight 83.7 kg, SpO2 98 %. AA Ox3. Sitting up in bed eating breakfast CNI   Strength:5/5 throughout BLE  Disposition: Discharge disposition: 01-Home or Self Care       Discharge Instructions     Incentive spirometry RT   Complete by: As directed    Remove dressing in 48 hours   Complete by: As directed       Allergies as of 01/19/2022       Reactions   Metronidazole Itching   Penicillin G Rash   Has patient had a PCN reaction causing immediate rash, facial/tongue/throat swelling, SOB or lightheadedness with hypotension: Unknown Has patient had a PCN reaction causing severe rash involving mucus membranes or skin necrosis: Unknown Has patient had a PCN reaction that required hospitalization:  No Has patient had a PCN reaction occurring within the last 10 years: No If all of the above answers are "NO", then may proceed with Cephalosporin use.        Medication List     STOP taking these medications    meloxicam 15 MG tablet Commonly known as: MOBIC       TAKE these medications    allopurinol 100 MG tablet Commonly known as: ZYLOPRIM Take 100 mg by mouth daily.   bisoprolol 5 MG tablet Commonly known as: ZEBETA Take 5 mg by mouth every other day.   cholecalciferol 25 MCG (1000 UNIT) tablet Commonly known as: VITAMIN D3 Take 1,000 Units by mouth daily.   cyanocobalamin 1000 MCG tablet Commonly known as: VITAMIN B12 Take 1,000 mcg by mouth daily.   diclofenac sodium 1 % Gel Commonly known as: VOLTAREN Apply 2 g topically 3 (three) times daily as needed for pain.   famotidine 20 MG tablet Commonly known as: PEPCID Take 20 mg by mouth at bedtime.   ferrous sulfate 324 MG Tbec Take 324 mg by mouth.   Fish Oil 1000 MG Caps Take 1,000 mg by mouth daily.   furosemide 20 MG tablet Commonly known as: LASIX Take 20 mg by mouth 2 (two) times daily. May take an extra tab daily if needed   gabapentin 400 MG capsule Commonly known as: Neurontin Take 1 capsule (400 mg total) by mouth 2 (two) times daily.   glipiZIDE 2.5 MG 24 hr tablet Commonly known as: GLUCOTROL XL Take 2.5 mg by mouth daily.   hydrALAZINE 100 MG tablet  Commonly known as: APRESOLINE Take 100 mg by mouth 2 (two) times daily.   levothyroxine 100 MCG tablet Commonly known as: SYNTHROID Take 100 mcg by mouth daily before breakfast.   metFORMIN 1000 MG tablet Commonly known as: GLUCOPHAGE Take 1,000 mg by mouth 2 (two) times daily.   methocarbamol 500 MG tablet Commonly known as: ROBAXIN Take 1 tablet (500 mg total) by mouth every 6 (six) hours as needed for muscle spasms.   omeprazole 40 MG capsule Commonly known as: PRILOSEC Take 40 mg by mouth daily before breakfast.    oxyCODONE 5 MG immediate release tablet Commonly known as: Oxy IR/ROXICODONE Take 1 tablet (5 mg total) by mouth every 4 (four) hours as needed for up to 5 days for moderate pain or severe pain ((score 4 to 6)).   rosuvastatin 5 MG tablet Commonly known as: CRESTOR Take 5 mg by mouth daily.   senna 8.6 MG Tabs tablet Commonly known as: SENOKOT Take 1 tablet (8.6 mg total) by mouth 2 (two) times daily.   valsartan-hydrochlorothiazide 160-12.5 MG tablet Commonly known as: DIOVAN-HCT Take 1 tablet by mouth daily.         Signed: Loleta Dicker 01/19/2022, 2:31 PM

## 2022-01-20 ENCOUNTER — Encounter: Payer: Self-pay | Admitting: Neurosurgery

## 2022-01-25 NOTE — Anesthesia Postprocedure Evaluation (Signed)
Anesthesia Post Note  Patient: JAMORI BIGGAR  Procedure(s) Performed: L4-5 LATERAL LUMBAR INTERBODY FUSION AND POSTERIOR SPINAL FUSION APPLICATION OF INTRAOPERATIVE CT SCAN  Patient location during evaluation: PACU Anesthesia Type: General Level of consciousness: awake and alert Pain management: pain level controlled Vital Signs Assessment: post-procedure vital signs reviewed and stable Respiratory status: spontaneous breathing, nonlabored ventilation, respiratory function stable and patient connected to nasal cannula oxygen Cardiovascular status: blood pressure returned to baseline and stable Postop Assessment: no apparent nausea or vomiting Anesthetic complications: no   No notable events documented.   Last Vitals:  Vitals:   01/19/22 0909 01/19/22 1511  BP: 124/67 (!) 116/55  Pulse: (!) 58 83  Resp: 16 16  Temp:  36.8 C  SpO2: 98% 100%    Last Pain:  Vitals:   01/19/22 1511  TempSrc: Oral  PainSc:                  Martha Clan

## 2022-01-28 NOTE — Progress Notes (Signed)
   REFERRING PHYSICIAN:  Adin Hector, Md [address] [place],  [place] [address]  DOS: 01/18/22 XLIF L4-L5  HISTORY OF PRESENT ILLNESS: Jesse Thompson is approximately 2 weeks status post XLIF L4-L5. Was given oxycodone and robaxin on discharge from the hospital.   He stopped the oxycodone this past weekend. He is taking prn robaxin. He has very little pain. His preop LBP is much better. His preop left leg pain is gone! HHPT is seeing him.    PHYSICAL EXAMINATION:  General: Patient is well developed, well nourished, calm, collected, and in no apparent distress.   NEUROLOGICAL:  General: In no acute distress.   Awake, alert, oriented to person, place, and time.  Pupils equal round and reactive to light.  Facial tone is symmetric.     Strength:          Side Iliopsoas Quads Hamstring PF DF EHL  R '5 5 5 5 5 5  '$ L '5 5 5 5 5 5   '$ Incisions c/d/I, he has diffuse bruising at his back that is fading.   ROS (Neurologic):  Negative except as noted above  IMAGING: Nothing new to review.   ASSESSMENT/PLAN:  Jesse Thompson is doing well s/p above surgery. Treatment options reviewed with patient and following plan made:   - I have advised the patient to lift up to 10 pounds until 6 weeks after surgery (follow up with Dr. Izora Ribas).  - Reviewed wound care.  - No bending, twisting, or lifting.  - Continue on current medications including prn robaxin.  - Follow up as scheduled in 4 weeks and prn.   Advised to contact the office if any questions or concerns arise.  Geronimo Boot PA-C Department of neurosurgery

## 2022-01-29 ENCOUNTER — Ambulatory Visit (INDEPENDENT_AMBULATORY_CARE_PROVIDER_SITE_OTHER): Payer: Medicare Other | Admitting: Orthopedic Surgery

## 2022-01-29 ENCOUNTER — Encounter: Payer: Self-pay | Admitting: Orthopedic Surgery

## 2022-01-29 VITALS — BP 118/60 | Temp 97.6°F | Ht 67.0 in | Wt 184.0 lb

## 2022-01-29 DIAGNOSIS — M532X6 Spinal instabilities, lumbar region: Secondary | ICD-10-CM

## 2022-01-29 DIAGNOSIS — Z981 Arthrodesis status: Secondary | ICD-10-CM

## 2022-01-29 DIAGNOSIS — Z09 Encounter for follow-up examination after completed treatment for conditions other than malignant neoplasm: Secondary | ICD-10-CM

## 2022-01-29 DIAGNOSIS — M4316 Spondylolisthesis, lumbar region: Secondary | ICD-10-CM

## 2022-02-16 ENCOUNTER — Encounter: Payer: Medicare Other | Admitting: Neurosurgery

## 2022-03-01 ENCOUNTER — Other Ambulatory Visit: Payer: Self-pay

## 2022-03-01 DIAGNOSIS — Z981 Arthrodesis status: Secondary | ICD-10-CM

## 2022-03-02 ENCOUNTER — Ambulatory Visit (INDEPENDENT_AMBULATORY_CARE_PROVIDER_SITE_OTHER): Payer: Medicare Other | Admitting: Neurosurgery

## 2022-03-02 ENCOUNTER — Encounter: Payer: Self-pay | Admitting: Neurosurgery

## 2022-03-02 ENCOUNTER — Ambulatory Visit
Admission: RE | Admit: 2022-03-02 | Discharge: 2022-03-02 | Disposition: A | Discharge status: Home / Self Care | Payer: Medicare Other | Source: Ambulatory Visit | Attending: Neurosurgery | Admitting: Neurosurgery

## 2022-03-02 ENCOUNTER — Ambulatory Visit
Admission: RE | Admit: 2022-03-02 | Discharge: 2022-03-02 | Disposition: A | Discharge status: Home / Self Care | Payer: Medicare Other | Attending: Neurosurgery | Admitting: Neurosurgery

## 2022-03-02 VITALS — BP 136/80 | Temp 97.8°F | Ht 67.0 in | Wt 184.0 lb

## 2022-03-02 DIAGNOSIS — Z981 Arthrodesis status: Secondary | ICD-10-CM | POA: Insufficient documentation

## 2022-03-02 DIAGNOSIS — M532X6 Spinal instabilities, lumbar region: Secondary | ICD-10-CM

## 2022-03-02 DIAGNOSIS — Z09 Encounter for follow-up examination after completed treatment for conditions other than malignant neoplasm: Secondary | ICD-10-CM

## 2022-03-02 DIAGNOSIS — M4316 Spondylolisthesis, lumbar region: Secondary | ICD-10-CM

## 2022-03-02 NOTE — Progress Notes (Signed)
   REFERRING PHYSICIAN:  Adin Hector, Md [address] [place],  [place] [address]  DOS: 01/18/22 XLIF L4-L5  HISTORY OF PRESENT ILLNESS: Jesse Thompson is status post XLIF L4-L5.  Jesse Thompson is doing very well from surgery.  Jesse Thompson has minimal pain.     PHYSICAL EXAMINATION:  General: Patient is well developed, well nourished, calm, collected, and in no apparent distress.   NEUROLOGICAL:  General: In no acute distress.   Awake, alert, oriented to person, place, and time.  Pupils equal round and reactive to light.  Facial tone is symmetric.     Strength:          Side Iliopsoas Quads Hamstring PF DF EHL  Jesse Thompson '5 5 5 5 5 5  '$ Jesse Thompson '5 5 5 5 5 5   '$ Incisions c/d/I, Jesse Thompson has diffuse bruising at his back that is fading.   ROS (Neurologic):  Negative except as noted above  IMAGING: No complications noted.     ASSESSMENT/PLAN:  Jesse Thompson is doing well s/p above surgery.  I am very pleased with his response to surgery.  I will release him to work with a 10 pound lifting limit.  I will see him back in 6 weeks with x-rays.    Meade Maw MD Department of neurosurgery

## 2022-03-23 ENCOUNTER — Encounter: Payer: Medicare Other | Admitting: Neurosurgery

## 2022-04-07 ENCOUNTER — Other Ambulatory Visit: Payer: Self-pay

## 2022-04-07 DIAGNOSIS — Z981 Arthrodesis status: Secondary | ICD-10-CM

## 2022-04-08 ENCOUNTER — Ambulatory Visit (INDEPENDENT_AMBULATORY_CARE_PROVIDER_SITE_OTHER): Payer: Medicare Other | Admitting: Neurosurgery

## 2022-04-08 ENCOUNTER — Ambulatory Visit
Admission: RE | Admit: 2022-04-08 | Discharge: 2022-04-08 | Disposition: A | Discharge status: Home / Self Care | Payer: Medicare Other | Source: Ambulatory Visit | Attending: Neurosurgery | Admitting: Neurosurgery

## 2022-04-08 VITALS — BP 165/68 | HR 54 | Temp 97.7°F | Wt 184.2 lb

## 2022-04-08 DIAGNOSIS — Z981 Arthrodesis status: Secondary | ICD-10-CM | POA: Insufficient documentation

## 2022-04-08 DIAGNOSIS — M4316 Spondylolisthesis, lumbar region: Secondary | ICD-10-CM

## 2022-04-08 DIAGNOSIS — M532X6 Spinal instabilities, lumbar region: Secondary | ICD-10-CM

## 2022-04-08 DIAGNOSIS — M5442 Lumbago with sciatica, left side: Secondary | ICD-10-CM

## 2022-04-08 DIAGNOSIS — Z09 Encounter for follow-up examination after completed treatment for conditions other than malignant neoplasm: Secondary | ICD-10-CM

## 2022-04-08 NOTE — Progress Notes (Signed)
   REFERRING PHYSICIAN:  Adin Hector, Md [address] [place],  [place] [address]  DOS: 01/18/22 XLIF L4-L5  HISTORY OF PRESENT ILLNESS: KARINA ZEISLOFT is status post XLIF L4-L5.  He is doing very well from surgery.  He has minimal pain.     PHYSICAL EXAMINATION:  General: Patient is well developed, well nourished, calm, collected, and in no apparent distress.   NEUROLOGICAL:  General: In no acute distress.   Awake, alert, oriented to person, place, and time.  Pupils equal round and reactive to light.  Facial tone is symmetric.     Strength:          Side Iliopsoas Quads Hamstring PF DF EHL  R 5 5 5 5 5 5  $ L 5 5 5 5 5 5   $ Incisions c/d/I, he has diffuse bruising at his back that is fading.   ROS (Neurologic):  Negative except as noted above  IMAGING: No complications noted.     ASSESSMENT/PLAN:  KAHEEM VENHAUS is doing well s/p above surgery.  I am very pleased with his response to surgery.    He is off restrictions.  I will see him back in 6 months with x-rays.  Meade Maw MD Department of neurosurgery

## 2022-04-29 ENCOUNTER — Encounter: Payer: Self-pay | Admitting: *Deleted

## 2022-04-30 ENCOUNTER — Encounter: Admission: RE | Payer: Self-pay | Source: Home / Self Care

## 2022-04-30 ENCOUNTER — Ambulatory Visit: Admission: RE | Admit: 2022-04-30 | Payer: Medicare Other | Source: Home / Self Care

## 2022-04-30 SURGERY — COLONOSCOPY WITH PROPOFOL
Anesthesia: General

## 2022-10-18 ENCOUNTER — Other Ambulatory Visit: Payer: Self-pay | Admitting: Internal Medicine

## 2022-10-18 DIAGNOSIS — K744 Secondary biliary cirrhosis: Secondary | ICD-10-CM

## 2022-10-27 ENCOUNTER — Ambulatory Visit
Admission: RE | Admit: 2022-10-27 | Discharge: 2022-10-27 | Disposition: A | Discharge status: Home / Self Care | Payer: Medicare Other | Source: Ambulatory Visit | Attending: Internal Medicine | Admitting: Internal Medicine

## 2022-10-27 DIAGNOSIS — K744 Secondary biliary cirrhosis: Secondary | ICD-10-CM | POA: Diagnosis present

## 2022-10-29 ENCOUNTER — Other Ambulatory Visit: Payer: Self-pay | Admitting: Internal Medicine

## 2022-10-29 DIAGNOSIS — K7689 Other specified diseases of liver: Secondary | ICD-10-CM

## 2022-11-05 ENCOUNTER — Ambulatory Visit
Admission: RE | Admit: 2022-11-05 | Discharge: 2022-11-05 | Disposition: A | Discharge status: Home / Self Care | Payer: Medicare Other | Source: Ambulatory Visit | Attending: Internal Medicine | Admitting: Internal Medicine

## 2022-11-05 DIAGNOSIS — K7689 Other specified diseases of liver: Secondary | ICD-10-CM | POA: Insufficient documentation

## 2022-11-05 MED ORDER — GADOBUTROL 1 MMOL/ML IV SOLN
7.5000 mL | Freq: Once | INTRAVENOUS | Status: AC | PRN
  Administered 2022-11-05: 7.5 mL via INTRAVENOUS

## 2022-12-23 ENCOUNTER — Other Ambulatory Visit: Payer: Self-pay | Admitting: Internal Medicine

## 2022-12-23 DIAGNOSIS — R1084 Generalized abdominal pain: Secondary | ICD-10-CM

## 2022-12-23 DIAGNOSIS — R140 Abdominal distension (gaseous): Secondary | ICD-10-CM

## 2022-12-24 ENCOUNTER — Ambulatory Visit
Admission: RE | Admit: 2022-12-24 | Discharge: 2022-12-24 | Disposition: A | Discharge status: Home / Self Care | Payer: Medicare Other | Source: Ambulatory Visit | Attending: Internal Medicine | Admitting: Internal Medicine

## 2022-12-24 DIAGNOSIS — R140 Abdominal distension (gaseous): Secondary | ICD-10-CM | POA: Insufficient documentation

## 2022-12-24 DIAGNOSIS — R1084 Generalized abdominal pain: Secondary | ICD-10-CM | POA: Insufficient documentation

## 2022-12-31 ENCOUNTER — Other Ambulatory Visit: Payer: Self-pay | Admitting: Internal Medicine

## 2022-12-31 ENCOUNTER — Ambulatory Visit
Admission: RE | Admit: 2022-12-31 | Discharge: 2022-12-31 | Disposition: A | Discharge status: Home / Self Care | Payer: Medicare Other | Source: Ambulatory Visit | Attending: Internal Medicine | Admitting: Internal Medicine

## 2022-12-31 DIAGNOSIS — R188 Other ascites: Secondary | ICD-10-CM

## 2022-12-31 MED ORDER — LIDOCAINE HCL (PF) 1 % IJ SOLN
5.0000 mL | Freq: Once | INTRAMUSCULAR | Status: AC
  Administered 2022-12-31: 5 mL via INTRADERMAL
  Filled 2022-12-31: qty 5

## 2022-12-31 NOTE — Procedures (Signed)
PROCEDURE SUMMARY:  Successful image-guided paracentesis from the right lower abdomen.  Yielded 4.0 liters of clear yellow fluid.  No immediate complications.  EBL: zero Patient tolerated well.   Specimen was not sent for labs.  Please see imaging section of Epic for full dictation.  Villa Herb PA-C 12/31/2022 1:36 PM

## 2023-01-21 ENCOUNTER — Other Ambulatory Visit: Payer: Self-pay | Admitting: Gastroenterology

## 2023-01-21 ENCOUNTER — Encounter: Payer: Self-pay | Admitting: Gastroenterology

## 2023-01-21 DIAGNOSIS — K744 Secondary biliary cirrhosis: Secondary | ICD-10-CM

## 2023-01-27 ENCOUNTER — Ambulatory Visit
Admission: RE | Admit: 2023-01-27 | Discharge: 2023-01-27 | Disposition: A | Discharge status: Home / Self Care | Payer: Medicare Other | Source: Ambulatory Visit | Attending: Gastroenterology | Admitting: Gastroenterology

## 2023-01-27 DIAGNOSIS — K744 Secondary biliary cirrhosis: Secondary | ICD-10-CM

## 2023-01-27 DIAGNOSIS — I509 Heart failure, unspecified: Secondary | ICD-10-CM | POA: Insufficient documentation

## 2023-01-27 DIAGNOSIS — R188 Other ascites: Secondary | ICD-10-CM | POA: Insufficient documentation

## 2023-01-27 MED ORDER — ALBUMIN HUMAN 25 % IV SOLN
INTRAVENOUS | Status: AC
  Filled 2023-01-27: qty 100

## 2023-01-27 MED ORDER — LIDOCAINE HCL (PF) 1 % IJ SOLN
10.0000 mL | Freq: Once | INTRAMUSCULAR | Status: AC
  Administered 2023-01-27: 10 mL via INTRADERMAL
  Filled 2023-01-27: qty 10

## 2023-01-27 MED ORDER — ALBUMIN HUMAN 25 % IV SOLN
25.0000 g | Freq: Once | INTRAVENOUS | Status: AC
  Administered 2023-01-27: 25 g via INTRAVENOUS

## 2023-01-27 NOTE — Procedures (Signed)
PROCEDURE SUMMARY:  Successful US guided paracentesis from RLQ. Yielded 2.8 L of clear yellow fluid.  No immediate complications.  Pt tolerated well.   Specimen was not sent for labs.  EBL < 5mL  Pattricia Boss D PA-C 01/27/2023 3:11 PM

## 2023-02-10 ENCOUNTER — Ambulatory Visit
Admission: RE | Admit: 2023-02-10 | Discharge: 2023-02-10 | Disposition: A | Discharge status: Home / Self Care | Payer: Medicare HMO | Source: Ambulatory Visit | Attending: Gastroenterology | Admitting: Gastroenterology

## 2023-03-16 ENCOUNTER — Other Ambulatory Visit: Payer: Self-pay | Admitting: Gastroenterology

## 2023-03-16 DIAGNOSIS — K8301 Primary sclerosing cholangitis: Secondary | ICD-10-CM

## 2023-03-21 ENCOUNTER — Ambulatory Visit
Admission: RE | Admit: 2023-03-21 | Discharge: 2023-03-21 | Disposition: A | Discharge status: Home / Self Care | Payer: Medicare HMO | Source: Ambulatory Visit | Attending: Gastroenterology | Admitting: Gastroenterology

## 2023-03-21 DIAGNOSIS — K8301 Primary sclerosing cholangitis: Secondary | ICD-10-CM | POA: Insufficient documentation

## 2023-04-01 ENCOUNTER — Encounter: Payer: Self-pay | Admitting: *Deleted

## 2023-04-18 ENCOUNTER — Ambulatory Visit: Admitting: General Practice

## 2023-04-18 ENCOUNTER — Encounter
Admission: RE | Disposition: A | Discharge status: Home / Self Care | Payer: Self-pay | Source: Home / Self Care | Attending: Gastroenterology

## 2023-04-18 ENCOUNTER — Ambulatory Visit
Admission: RE | Admit: 2023-04-18 | Discharge: 2023-04-18 | Disposition: A | Discharge status: Home / Self Care | Payer: Medicare HMO | Attending: Gastroenterology | Admitting: Gastroenterology

## 2023-04-18 ENCOUNTER — Encounter: Payer: Self-pay | Admitting: *Deleted

## 2023-04-18 DIAGNOSIS — I8510 Secondary esophageal varices without bleeding: Secondary | ICD-10-CM | POA: Diagnosis not present

## 2023-04-18 DIAGNOSIS — E1122 Type 2 diabetes mellitus with diabetic chronic kidney disease: Secondary | ICD-10-CM | POA: Insufficient documentation

## 2023-04-18 DIAGNOSIS — I130 Hypertensive heart and chronic kidney disease with heart failure and stage 1 through stage 4 chronic kidney disease, or unspecified chronic kidney disease: Secondary | ICD-10-CM | POA: Diagnosis not present

## 2023-04-18 DIAGNOSIS — K3189 Other diseases of stomach and duodenum: Secondary | ICD-10-CM | POA: Insufficient documentation

## 2023-04-18 DIAGNOSIS — K641 Second degree hemorrhoids: Secondary | ICD-10-CM | POA: Insufficient documentation

## 2023-04-18 DIAGNOSIS — I5032 Chronic diastolic (congestive) heart failure: Secondary | ICD-10-CM | POA: Diagnosis not present

## 2023-04-18 DIAGNOSIS — G4733 Obstructive sleep apnea (adult) (pediatric): Secondary | ICD-10-CM | POA: Diagnosis not present

## 2023-04-18 DIAGNOSIS — N183 Chronic kidney disease, stage 3 unspecified: Secondary | ICD-10-CM | POA: Diagnosis not present

## 2023-04-18 DIAGNOSIS — K5730 Diverticulosis of large intestine without perforation or abscess without bleeding: Secondary | ICD-10-CM | POA: Diagnosis not present

## 2023-04-18 DIAGNOSIS — E039 Hypothyroidism, unspecified: Secondary | ICD-10-CM | POA: Insufficient documentation

## 2023-04-18 DIAGNOSIS — Z1211 Encounter for screening for malignant neoplasm of colon: Secondary | ICD-10-CM | POA: Insufficient documentation

## 2023-04-18 DIAGNOSIS — K766 Portal hypertension: Secondary | ICD-10-CM | POA: Diagnosis not present

## 2023-04-18 DIAGNOSIS — K5520 Angiodysplasia of colon without hemorrhage: Secondary | ICD-10-CM | POA: Insufficient documentation

## 2023-04-18 HISTORY — PX: COLONOSCOPY WITH PROPOFOL: SHX5780

## 2023-04-18 HISTORY — DX: Hepatomegaly, not elsewhere classified: R16.0

## 2023-04-18 HISTORY — PX: ESOPHAGOGASTRODUODENOSCOPY (EGD) WITH PROPOFOL: SHX5813

## 2023-04-18 LAB — GLUCOSE, CAPILLARY: Glucose-Capillary: 93 mg/dL (ref 70–99)

## 2023-04-18 SURGERY — COLONOSCOPY WITH PROPOFOL
Anesthesia: General

## 2023-04-18 MED ORDER — SODIUM CHLORIDE 0.9 % IV SOLN: INTRAVENOUS | Status: DC

## 2023-04-18 MED ORDER — PROPOFOL 1000 MG/100ML IV EMUL
INTRAVENOUS | Status: AC
  Filled 2023-04-18: qty 100

## 2023-04-18 MED ORDER — LIDOCAINE HCL (PF) 2 % IJ SOLN
INTRAMUSCULAR | Status: AC
  Filled 2023-04-18: qty 5

## 2023-04-18 MED ORDER — EPHEDRINE SULFATE-NACL 50-0.9 MG/10ML-% IV SOSY
PREFILLED_SYRINGE | INTRAVENOUS | Status: DC | PRN
  Administered 2023-04-18 (×2): 10 mg via INTRAVENOUS

## 2023-04-18 MED ORDER — PROPOFOL 10 MG/ML IV BOLUS
INTRAVENOUS | Status: DC | PRN
  Administered 2023-04-18: 50 mg via INTRAVENOUS

## 2023-04-18 MED ORDER — EPHEDRINE 5 MG/ML INJ
INTRAVENOUS | Status: AC
  Filled 2023-04-18: qty 5

## 2023-04-18 MED ORDER — PROPOFOL 500 MG/50ML IV EMUL
INTRAVENOUS | Status: DC | PRN
  Administered 2023-04-18: 120 ug/kg/min via INTRAVENOUS

## 2023-04-18 MED ORDER — LIDOCAINE HCL (CARDIAC) PF 100 MG/5ML IV SOSY
PREFILLED_SYRINGE | INTRAVENOUS | Status: DC | PRN
  Administered 2023-04-18: 60 mg via INTRAVENOUS

## 2023-04-18 NOTE — Transfer of Care (Signed)
 Immediate Anesthesia Transfer of Care Note  Patient: Jesse Thompson  Procedure(s) Performed: COLONOSCOPY WITH PROPOFOL ESOPHAGOGASTRODUODENOSCOPY (EGD) WITH PROPOFOL  Patient Location: PACU  Anesthesia Type:General  Level of Consciousness: awake and sedated  Airway & Oxygen Therapy: Patient Spontanous Breathing and Patient connected to nasal cannula oxygen  Post-op Assessment: Report given to RN and Post -op Vital signs reviewed and stable  Post vital signs: Reviewed and stable  Last Vitals:  Vitals Value Taken Time  BP    Temp    Pulse    Resp    SpO2      Last Pain:  Vitals:   04/18/23 0703  TempSrc: Temporal         Complications: There were no known notable events for this encounter.

## 2023-04-18 NOTE — Interval H&P Note (Signed)
 History and Physical Interval Note:  04/18/2023 7:43 AM  Jesse Thompson  has presented today for surgery, with the diagnosis of Secondary Biliary Cirrhosis Colon Cancer Screening.  The various methods of treatment have been discussed with the patient and family. After consideration of risks, benefits and other options for treatment, the patient has consented to  Procedure(s): COLONOSCOPY WITH PROPOFOL (N/A) ESOPHAGOGASTRODUODENOSCOPY (EGD) WITH PROPOFOL (N/A) as a surgical intervention.  The patient's history has been reviewed, patient examined, no change in status, stable for surgery.  I have reviewed the patient's chart and labs.  Questions were answered to the patient's satisfaction.     Regis Bill  Ok to proceed with EGD/Colonoscopy

## 2023-04-18 NOTE — Op Note (Signed)
 [facility] Gastroenterology Patient Name: Jesse Thompson Procedure Date: 04/18/2023 7:41 AM MRN: [id number] Account #: [id number] Date of Birth: 17-Nov-1950 Admit Type: Outpatient Age: 73 Room: [facility] ENDO ROOM 3 Gender: Male Note Status: Finalized Instrument Name: Prentice Docker [id number] Procedure:             Colonoscopy Indications:           Screening for colorectal malignant neoplasm Providers:             Eather Colas MD, MD Referring MD:          Eather Colas MD, MD (Referring MD), Daniel Nones, MD                         (Referring MD) Medicines:             Monitored Anesthesia Care Complications:         No immediate complications. Procedure:             Pre-Anesthesia Assessment:                        - Prior to the procedure, a History and Physical was                         performed, and patient medications and allergies were                         reviewed. The patient is competent. The risks and                         benefits of the procedure and the sedation options and                         risks were discussed with the patient. All questions                         were answered and informed consent was obtained.                         Patient identification and proposed procedure were                         verified by the physician, the nurse, the                         anesthesiologist, the anesthetist and the technician                         in the endoscopy suite. Mental Status Examination:                         alert and oriented. Airway Examination: normal                         oropharyngeal airway and neck mobility. Respiratory                         Examination: clear to auscultation. CV Examination:  normal. Prophylactic Antibiotics: The patient does not                         require prophylactic antibiotics. Prior                         Anticoagulants: The patient has taken no anticoagulant                          or antiplatelet agents. ASA Grade Assessment: III - A                         patient with severe systemic disease. After reviewing                         the risks and benefits, the patient was deemed in                         satisfactory condition to undergo the procedure. The                         anesthesia plan was to use monitored anesthesia care                         (MAC). Immediately prior to administration of                         medications, the patient was re-assessed for adequacy                         to receive sedatives. The heart rate, respiratory                         rate, oxygen saturations, blood pressure, adequacy of                         pulmonary ventilation, and response to care were                         monitored throughout the procedure. The physical                         status of the patient was re-assessed after the                         procedure.                        After obtaining informed consent, the colonoscope was                         passed under direct vision. Throughout the procedure,                         the patient's blood pressure, pulse, and oxygen                         saturations were monitored continuously. The  Colonoscope was introduced through the anus and                         advanced to the the terminal ileum, with                         identification of the appendiceal orifice and IC                         valve. The colonoscopy was performed without                         difficulty. The patient tolerated the procedure well.                         The quality of the bowel preparation was good. The                         terminal ileum, ileocecal valve, appendiceal orifice,                         and rectum were photographed. Findings:      The perianal and digital rectal examinations were normal.      The terminal ileum appeared normal.      A  single small-mouthed diverticulum was found in the cecum.      Multiple large patchy angioectasias without bleeding were found in the       sigmoid colon, in the descending colon, at the splenic flexure, in the       transverse colon, at the hepatic flexure and in the ascending colon.      Internal hemorrhoids were found during retroflexion. The hemorrhoids       were Grade II (internal hemorrhoids that prolapse but reduce       spontaneously).      The exam was otherwise without abnormality on direct and retroflexion       views. Impression:            - The examined portion of the ileum was normal.                        - Diverticulosis in the cecum.                        - Multiple non-bleeding colonic angioectasias.                        - Internal hemorrhoids.                        - The examination was otherwise normal on direct and                         retroflexion views.                        - No specimens collected. Recommendation:        - Discharge patient to home.                        - Resume previous diet.                        -  Continue present medications.                        - Repeat colonoscopy is not recommended due to current                         age (83 years or older) for screening purposes.                        - Return to referring physician as previously                         scheduled. Procedure Code(s):     --- Professional ---                        N8295, Colorectal cancer screening; colonoscopy on                         individual not meeting criteria for high risk Diagnosis Code(s):     --- Professional ---                        Z12.11, Encounter for screening for malignant neoplasm                         of colon                        K64.1, Second degree hemorrhoids                        K55.20, Angiodysplasia of colon without hemorrhage                        K57.30, Diverticulosis of large intestine without                          perforation or abscess without bleeding CPT copyright 2022 [employer]. All rights reserved. The codes documented in this report are preliminary and upon coder review may  be revised to meet current compliance requirements. Eather Colas MD, MD 04/18/2023 8:20:23 AM Number of Addenda: 0 Note Initiated On: 04/18/2023 7:41 AM Scope Withdrawal Time: 0 hours 7 minutes 35 seconds  Total Procedure Duration: 0 hours 11 minutes 18 seconds  Estimated Blood Loss:  Estimated blood loss: none.      [facility]

## 2023-04-18 NOTE — H&P (Signed)
 Outpatient short stay form Pre-procedure 04/18/2023  Regis Bill, MD  Primary Physician: Lynnea Ferrier, MD  Reason for visit:  PSC/Colon cancer screening  History of present illness:    73 y/o gentleman with history of decompensated PSC cirrhosis, hypothyroidism, and CKD here for EGD for variceal assessment and screening colonoscopy. No blood thinners. No family history of GI malignancies. No significant abdominal surgeries. Last platelet count was 101 and last INR was 1.2.    Current Facility-Administered Medications:    0.9 %  sodium chloride infusion, , Intravenous, Continuous, Ramona Slinger, Rossie Muskrat, MD  Medications Prior to Admission  Medication Sig Dispense Refill Last Dose/Taking   ferrous sulfate 324 MG TBEC Take 324 mg by mouth.   Past Week   furosemide (LASIX) 20 MG tablet Take 20 mg by mouth 2 (two) times daily. May take an extra tab daily if needed   04/17/2023   gabapentin (NEURONTIN) 400 MG capsule Take 1 capsule (400 mg total) by mouth 2 (two) times daily. 60 capsule 3 04/17/2023   hydrALAZINE (APRESOLINE) 100 MG tablet Take 100 mg by mouth 2 (two) times daily.   04/17/2023   levothyroxine (SYNTHROID, LEVOTHROID) 100 MCG tablet Take 100 mcg by mouth daily before breakfast.   04/18/2023 Morning   metFORMIN (GLUCOPHAGE) 1000 MG tablet Take 1,000 mg by mouth 2 (two) times daily.   04/17/2023   methocarbamol (ROBAXIN) 500 MG tablet Take 1 tablet (500 mg total) by mouth every 6 (six) hours as needed for muscle spasms. 120 tablet 0 04/17/2023   rosuvastatin (CRESTOR) 5 MG tablet Take 5 mg by mouth daily.   04/17/2023   valsartan-hydrochlorothiazide (DIOVAN-HCT) 160-12.5 MG tablet Take 1 tablet by mouth daily.   04/17/2023   allopurinol (ZYLOPRIM) 100 MG tablet Take 100 mg by mouth daily.      bisoprolol (ZEBETA) 5 MG tablet Take 5 mg by mouth every other day.      cholecalciferol (VITAMIN D3) 25 MCG (1000 UNIT) tablet Take 1,000 Units by mouth daily.      clopidogrel (PLAVIX) 75 MG  tablet Take 75 mg by mouth daily. (Patient not taking: Reported on 04/18/2023)   Not Taking   diclofenac sodium (VOLTAREN) 1 % GEL Apply 2 g topically 3 (three) times daily as needed for pain.  11    famotidine (PEPCID) 20 MG tablet Take 20 mg by mouth at bedtime.      glipiZIDE (GLUCOTROL XL) 2.5 MG 24 hr tablet Take 2.5 mg by mouth daily.      Omega-3 Fatty Acids (FISH OIL) 1000 MG CAPS Take 1,000 mg by mouth daily.      omeprazole (PRILOSEC) 40 MG capsule Take 40 mg by mouth daily before breakfast.       senna (SENOKOT) 8.6 MG TABS tablet Take 1 tablet (8.6 mg total) by mouth 2 (two) times daily. 30 tablet 0    vitamin B-12 (CYANOCOBALAMIN) 1000 MCG tablet Take 1,000 mcg by mouth daily.        Allergies  Allergen Reactions   Metronidazole Itching   Penicillin G Rash    Has patient had a PCN reaction causing immediate rash, facial/tongue/throat swelling, SOB or lightheadedness with hypotension: Unknown Has patient had a PCN reaction causing severe rash involving mucus membranes or skin necrosis: Unknown Has patient had a PCN reaction that required hospitalization: No Has patient had a PCN reaction occurring within the last 10 years: No If all of the above answers are "NO", then may proceed with  Cephalosporin use.      Past Medical History:  Diagnosis Date   (HFpEF) heart failure with preserved ejection fraction (HCC)    a.) TTE 01/13/2017: EF >55%, BAE, triv AR, mild TR/PR, G2DD; b.) TTE 12/06/2017: EF >55%, BAE, triv AR/PR, mild MR/TR, G1DD; c.) TTE 07/24/2019: EF >55%, LAE, triv AR/PR, mild MR/TR, G2DD; d.) TTE 07/29/2020: EF 50%, mild LVH, mod LAE, mild MR/TR   Anemia    Aortic atherosclerosis (HCC)    Bursitis    CKD (chronic kidney disease), stage III (HCC)    Coronary artery disease    a.) R/LHC/PCI 09/13/2019: EF 50-55%. 75% pLAD (2.25 x 15 mm Resolute Onyx DES), 100% D1, 75% mLAD (2.25 x 15 mm Resolute Onyx DES), 25% pLCx, tandem 50% and 90% OM1 (2.5 x 22 mm Resolute Onyx  DES). mPA 18, mPCWP 10, LVEDP 12, PA sat 82, AO sat 100, PVR 76, CO 8.4; b.) LHC 10/27/2020: 25 pLCx, 100% D1, 75% RPDA-1, 99% RPDA-2, 75% RPDA-3, 25% m-dRCA, min luminal irregs RCA - med mgmt.   DOE (dyspnea on exertion)    Esophageal stricture    a.) s/p dilitation x 2   GERD (gastroesophageal reflux disease)    Gout    History of Graves' disease    a.) s/p I-131 treatment in 08/2001 --> resulting clinical hypothyroidism; b.) s/p total thyroidectomy   HOH (hard of hearing)    Bilateral Hearing Aids   Hyperlipidemia    Hypertension    Hypothyroidism    Liver mass, right lobe    NSTEMI (non-ST elevated myocardial infarction) (HCC) 09/13/2019   a.) PCI 09/10/2019: EF 50-55%. 75% pLAD (2.25 x 15 mm Resolute Onyx DES), 75% mLAD (2.25 x 15 mm Resolute Onyx DES), tandem 50% and 90% OM1 (2.5 x 22 mm Resolute Onyx DES)   OSA (obstructive sleep apnea)    a.) not using prescribed nocturnal PAP therapy   Osteoarthritis    Peripheral edema    Primary sclerosing cholangitis 2002   a.) Dx'd with ERCP done at [facility] in 2002   Pulmonary nodules    Spondylolisthesis of lumbar region    T2DM (type 2 diabetes mellitus) (HCC)    TIA (transient ischemic attack)    Vertigo     Review of systems:  Otherwise negative.    Physical Exam  Gen: Alert, oriented. Appears stated age.  HEENT: PERRLA. Lungs: No respiratory distress CV: RRR Abd: soft, benign, no masses Ext: No edema    Planned procedures: Proceed with EGD/colonoscopy. The patient understands the nature of the planned procedure, indications, risks, alternatives and potential complications including but not limited to bleeding, infection, perforation, damage to internal organs and possible oversedation/side effects from anesthesia. The patient agrees and gives consent to proceed.  Please refer to procedure notes for findings, recommendations and patient disposition/instructions.     Regis Bill, MD [facility]  Gastroenterology

## 2023-04-18 NOTE — Op Note (Signed)
 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 Procedure:             Pre-Anesthesia Assessment:                        - Prior to the procedure, a History and Physical was                         performed, and patient medications and allergies were                         reviewed. The patient is competent. The risks and                         benefits of the procedure and the sedation options and                         risks were discussed with the patient. All questions                         were answered and informed consent was obtained.                         Patient identification and proposed procedure were                         verified by the physician, the nurse, the                         anesthesiologist, the anesthetist and the technician                         in the endoscopy suite. Mental Status Examination:                         alert and oriented. Airway Examination: normal                         oropharyngeal airway and neck mobility. Respiratory                         Examination: clear to auscultation. CV Examination:  normal. Prophylactic Antibiotics: The patient does not                         require prophylactic antibiotics. Prior                         Anticoagulants: The patient has  taken no anticoagulant                         or antiplatelet agents. ASA Grade Assessment: III - A                         patient with severe systemic disease. After reviewing                         the risks and benefits, the patient was deemed in                         satisfactory condition to undergo the procedure. The                         anesthesia plan was to use monitored anesthesia care                         (MAC). Immediately prior to administration of                         medications, the patient was re-assessed for adequacy                         to receive sedatives. The heart rate, respiratory                         rate, oxygen saturations, blood pressure, adequacy of                         pulmonary ventilation, and response to care were                         monitored throughout the procedure. The physical                         status of the patient was re-assessed after the                         procedure.                        After obtaining informed consent, the endoscope was                         passed under direct vision. Throughout the procedure,                         the patient's blood pressure, pulse, and oxygen                         saturations were monitored continuously. The  Endosonoscope was introduced through the mouth, and                         advanced to the second part of duodenum. The upper GI                         endoscopy was accomplished without difficulty. The                         patient tolerated the procedure well. Findings:      Grade II varices were found in the lower third of the esophagus. They       were small in size.      Mild portal hypertensive gastropathy was found in the gastric fundus and       in the gastric body.      The examined duodenum was normal. Impression:            - Grade II esophageal varices.                        - Portal hypertensive gastropathy.                         - Normal examined duodenum.                        - No specimens collected. Recommendation:        - Discharge patient to home.                        - Resume previous diet.                        - Continue present medications.                        - Give a beta blocker with dosage titrated by the                         heart rate.                        - Return to referring physician as previously                         scheduled. Procedure Code(s):     --- Professional ---                        [phone], Esophagogastroduodenoscopy, flexible,                         transoral; diagnostic, including collection of                         specimen(s) by brushing or washing, when performed                         (separate procedure) Diagnosis Code(s):     --- Professional ---                        K76.6, Portal  hypertension                        I85.10, Secondary esophageal varices without bleeding                        K31.89, Other diseases of stomach and duodenum CPT copyright 2022 [employer]. All rights reserved. The codes documented in this report are preliminary and upon coder review may  be revised to meet current compliance requirements. Eather Colas MD, MD 04/18/2023 8:16:06 AM Number of Addenda: 0 Note Initiated On: 04/18/2023 7:42 AM Estimated Blood Loss:  Estimated blood loss: none.      [facility]

## 2023-04-18 NOTE — Anesthesia Preprocedure Evaluation (Signed)
 Anesthesia Evaluation  Patient identified by MRN, date of birth, ID band Patient awake    Reviewed: Allergy & Precautions, H&P , NPO status , Patient's Chart, lab work & pertinent test results, reviewed documented beta blocker date and time   Airway Mallampati: II  TM Distance: >3 FB Neck ROM: full    Dental  (+) Poor Dentition   Pulmonary sleep apnea and Continuous Positive Airway Pressure Ventilation    Pulmonary exam normal        Cardiovascular Exercise Tolerance: Poor hypertension, On Medications + CAD, + Past MI and + DOE  Normal cardiovascular exam Rhythm:regular Rate:Normal     Neuro/Psych Very poor historian TIA negative psych ROS   GI/Hepatic Neg liver ROS,GERD  Medicated,,  Endo/Other  diabetes, Well ControlledHypothyroidism    Renal/GU      Musculoskeletal   Abdominal   Peds  Hematology  (+) Blood dyscrasia, anemia   Anesthesia Other Findings Past Medical History: No date: (HFpEF) heart failure with preserved ejection fraction (HCC)     Comment:  a.) TTE 01/13/2017: EF >55%, BAE, triv AR, mild TR/PR,               G2DD; b.) TTE 12/06/2017: EF >55%, BAE, triv AR/PR, mild               MR/TR, G1DD; c.) TTE 07/24/2019: EF >55%, LAE, triv               AR/PR, mild MR/TR, G2DD; d.) TTE 07/29/2020: EF 50%, mild              LVH, mod LAE, mild MR/TR No date: Anemia No date: Aortic atherosclerosis (HCC) No date: Bursitis No date: CKD (chronic kidney disease), stage III (HCC) No date: Coronary artery disease     Comment:  a.) R/LHC/PCI 09/13/2019: EF 50-55%. 75% pLAD (2.25 x 15              mm Resolute Onyx DES), 100% D1, 75% mLAD (2.25 x 15 mm               Resolute Onyx DES), 25% pLCx, tandem 50% and 90% OM1 (2.5              x 22 mm Resolute Onyx DES). mPA 18, mPCWP 10, LVEDP 12,               PA sat 82, AO sat 100, PVR 76, CO 8.4; b.) LHC               10/27/2020: 25 pLCx, 100% D1, 75% RPDA-1, 99%  RPDA-2, 75%              RPDA-3, 25% m-dRCA, min luminal irregs RCA - med mgmt. No date: DOE (dyspnea on exertion) No date: Esophageal stricture     Comment:  a.) s/p dilitation x 2 No date: GERD (gastroesophageal reflux disease) No date: Gout No date: History of Graves' disease     Comment:  a.) s/p I-131 treatment in 08/2001 --> resulting               clinical hypothyroidism; b.) s/p total thyroidectomy No date: HOH (hard of hearing)     Comment:  Bilateral Hearing Aids No date: Hyperlipidemia No date: Hypertension No date: Hypothyroidism 09/13/2019: NSTEMI (non-ST elevated myocardial infarction) (HCC)     Comment:  a.) PCI 09/10/2019: EF 50-55%. 75% pLAD (2.25 x 15 mm  Resolute Onyx DES), 75% mLAD (2.25 x 15 mm Resolute Onyx               DES), tandem 50% and 90% OM1 (2.5 x 22 mm Resolute Onyx               DES) No date: OSA (obstructive sleep apnea)     Comment:  a.) not using prescribed nocturnal PAP therapy No date: Osteoarthritis No date: Peripheral edema 2002: Primary sclerosing cholangitis     Comment:  a.) Dx'd with ERCP done at [facility] in 2002 No date: Pulmonary nodules No date: Spondylolisthesis of lumbar region No date: T2DM (type 2 diabetes mellitus) (HCC) No date: TIA (transient ischemic attack) No date: Vertigo Past Surgical History: No date: ANKLE FRACTURE SURGERY; Right 2015: BILE DUCT STENT PLACEMENT     Comment:  now out 09/15/2016: CARPAL TUNNEL RELEASE; Right     Comment:  Procedure: CARPAL TUNNEL RELEASE;  Surgeon: Deeann Saint, MD;  Location: [facility] ORS;  Service: Orthopedics;                Laterality: Right; 09/29/2016: CARPAL TUNNEL RELEASE; Left     Comment:  Procedure: CARPAL TUNNEL RELEASE;  Surgeon: Deeann Saint, MD;  Location: [facility] ORS;  Service: Orthopedics;                Laterality: Left;  sutures removed right hand from status              post carpal tunnel No date: COLONOSCOPY WITH  ESOPHAGOGASTRODUODENOSCOPY (EGD) 09/13/2019: CORONARY STENT INTERVENTION; N/A     Comment:  Procedure: CORONARY STENT INTERVENTION;  Surgeon:               Alwyn Pea, MD;  Location: [facility] INVASIVE CV LAB;               Service: Cardiovascular;  Laterality: N/A;  LAD 10/27/2020: LEFT HEART CATH AND CORONARY ANGIOGRAPHY; Left     Comment:  Procedure: LEFT HEART CATH AND CORONARY ANGIOGRAPHY;                Surgeon: Alwyn Pea, MD;  Location: [facility] INVASIVE              CV LAB;  Service: Cardiovascular;  Laterality: Left; 12/26/2015: PARATHYROIDECTOMY / EXPLORATION OF PARATHYROIDS 09/13/2019: RIGHT/LEFT HEART CATH AND CORONARY ANGIOGRAPHY; N/A     Comment:  Procedure: RIGHT/LEFT HEART CATH AND CORONARY               ANGIOGRAPHY;  Surgeon: Alwyn Pea, MD;  Location:              [facility] INVASIVE CV LAB;  Service: Cardiovascular;                Laterality: N/A; No date: TOTAL THYROIDECTOMY BMI    Body Mass Index: 28.90 kg/m     Reproductive/Obstetrics negative OB ROS                             Anesthesia Physical Anesthesia Plan  ASA: 3  Anesthesia Plan: General   Post-op Pain Management: Minimal or no pain anticipated   Induction: Intravenous  PONV Risk Score and Plan: 3 and Propofol infusion, TIVA and Ondansetron  Airway Management Planned: Nasal Cannula  Additional Equipment: None  Intra-op Plan:   Post-operative Plan:   Informed Consent: I have reviewed the patients History and Physical, chart, labs and discussed the procedure including the risks, benefits and alternatives for the proposed anesthesia with the patient or authorized representative who has indicated his/her understanding and acceptance.     Dental advisory given  Plan Discussed with: CRNA and Surgeon  Anesthesia Plan Comments: (Discussed risks of anesthesia with patient, including possibility of difficulty with spontaneous ventilation under anesthesia  necessitating airway intervention, PONV, and rare risks such as cardiac or respiratory or neurological events, and allergic reactions. Discussed the role of CRNA in patient's perioperative care. Patient understands.)       Anesthesia Quick Evaluation

## 2023-04-18 NOTE — Anesthesia Postprocedure Evaluation (Signed)
 Anesthesia Post Note  Patient: ARGYLE GUSTAFSON  Procedure(s) Performed: COLONOSCOPY WITH PROPOFOL ESOPHAGOGASTRODUODENOSCOPY (EGD) WITH PROPOFOL  Patient location during evaluation: Endoscopy Anesthesia Type: General Level of consciousness: awake and alert Pain management: pain level controlled Vital Signs Assessment: post-procedure vital signs reviewed and stable Respiratory status: spontaneous breathing, nonlabored ventilation, respiratory function stable and patient connected to nasal cannula oxygen Cardiovascular status: blood pressure returned to baseline and stable Postop Assessment: no apparent nausea or vomiting Anesthetic complications: no  There were no known notable events for this encounter.   Last Vitals:  Vitals:   04/18/23 0703 04/18/23 0813  BP:  (!) 95/53  Pulse: (!) 54 68  Resp: 18 16  Temp: (!) 35.8 C 36.4 C  SpO2: 100% 100%    Last Pain:  Vitals:   04/18/23 0833  TempSrc:   PainSc: 0-No pain                 Stephanie Coup

## 2023-10-24 ENCOUNTER — Other Ambulatory Visit: Payer: Self-pay | Admitting: Internal Medicine

## 2023-10-24 DIAGNOSIS — K744 Secondary biliary cirrhosis: Secondary | ICD-10-CM

## 2023-11-07 ENCOUNTER — Ambulatory Visit
Admission: RE | Admit: 2023-11-07 | Discharge: 2023-11-07 | Disposition: A | Discharge status: Home / Self Care | Source: Ambulatory Visit | Attending: Internal Medicine | Admitting: Internal Medicine

## 2023-11-07 DIAGNOSIS — K744 Secondary biliary cirrhosis: Secondary | ICD-10-CM | POA: Insufficient documentation
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
## 2023-12-18 ENCOUNTER — Emergency Department

## 2023-12-18 ENCOUNTER — Emergency Department
Admission: EM | Admit: 2023-12-18 | Discharge: 2023-12-18 | Disposition: A | Discharge status: Other Acute Inpatient Hospital | Attending: Emergency Medicine | Admitting: Emergency Medicine

## 2023-12-18 ENCOUNTER — Other Ambulatory Visit: Payer: Self-pay

## 2023-12-18 DIAGNOSIS — K5730 Diverticulosis of large intestine without perforation or abscess without bleeding: Secondary | ICD-10-CM | POA: Insufficient documentation

## 2023-12-18 DIAGNOSIS — S3011XA Contusion of abdominal wall, initial encounter: Secondary | ICD-10-CM | POA: Diagnosis not present

## 2023-12-18 DIAGNOSIS — S4991XA Unspecified injury of right shoulder and upper arm, initial encounter: Secondary | ICD-10-CM | POA: Diagnosis present

## 2023-12-18 DIAGNOSIS — I2510 Atherosclerotic heart disease of native coronary artery without angina pectoris: Secondary | ICD-10-CM | POA: Diagnosis not present

## 2023-12-18 DIAGNOSIS — N1832 Chronic kidney disease, stage 3b: Secondary | ICD-10-CM | POA: Insufficient documentation

## 2023-12-18 DIAGNOSIS — D631 Anemia in chronic kidney disease: Secondary | ICD-10-CM | POA: Diagnosis not present

## 2023-12-18 DIAGNOSIS — S42254A Nondisplaced fracture of greater tuberosity of right humerus, initial encounter for closed fracture: Secondary | ICD-10-CM | POA: Insufficient documentation

## 2023-12-18 DIAGNOSIS — K7460 Unspecified cirrhosis of liver: Secondary | ICD-10-CM | POA: Insufficient documentation

## 2023-12-18 DIAGNOSIS — E1122 Type 2 diabetes mellitus with diabetic chronic kidney disease: Secondary | ICD-10-CM | POA: Diagnosis not present

## 2023-12-18 DIAGNOSIS — D696 Thrombocytopenia, unspecified: Secondary | ICD-10-CM | POA: Diagnosis not present

## 2023-12-18 DIAGNOSIS — I129 Hypertensive chronic kidney disease with stage 1 through stage 4 chronic kidney disease, or unspecified chronic kidney disease: Secondary | ICD-10-CM | POA: Diagnosis not present

## 2023-12-18 DIAGNOSIS — R161 Splenomegaly, not elsewhere classified: Secondary | ICD-10-CM | POA: Diagnosis not present

## 2023-12-18 DIAGNOSIS — S0990XA Unspecified injury of head, initial encounter: Secondary | ICD-10-CM | POA: Diagnosis present

## 2023-12-18 LAB — COMPREHENSIVE METABOLIC PANEL WITH GFR
ALT: 43 U/L (ref 0–44)
AST: 60 U/L — ABNORMAL HIGH (ref 15–41)
Albumin: 3.3 g/dL — ABNORMAL LOW (ref 3.5–5.0)
Alkaline Phosphatase: 105 U/L (ref 38–126)
Anion gap: 8 (ref 5–15)
BUN: 37 mg/dL — ABNORMAL HIGH (ref 8–23)
CO2: 21 mmol/L — ABNORMAL LOW (ref 22–32)
Calcium: 8.6 mg/dL — ABNORMAL LOW (ref 8.9–10.3)
Chloride: 116 mmol/L — ABNORMAL HIGH (ref 98–111)
Creatinine, Ser: 2.25 mg/dL — ABNORMAL HIGH (ref 0.61–1.24)
GFR, Estimated: 30 mL/min — ABNORMAL LOW (ref >60)
Glucose, Bld: 150 mg/dL — ABNORMAL HIGH (ref 70–99)
Potassium: 4.9 mmol/L (ref 3.5–5.1)
Sodium: 145 mmol/L (ref 135–145)
Total Bilirubin: 0.9 mg/dL (ref 0.0–1.2)
Total Protein: 6.2 g/dL — ABNORMAL LOW (ref 6.5–8.1)

## 2023-12-18 LAB — APTT: aPTT: 31 s (ref 24–36)

## 2023-12-18 LAB — CBC WITH DIFFERENTIAL/PLATELET
Abs Immature Granulocytes: 0.03 K/uL (ref 0.00–0.07)
Basophils Absolute: 0.1 K/uL (ref 0.0–0.1)
Basophils Relative: 1 %
Eosinophils Absolute: 0.2 K/uL (ref 0.0–0.5)
Eosinophils Relative: 3 %
HCT: 26.9 % — ABNORMAL LOW (ref 39.0–52.0)
Hemoglobin: 8.6 g/dL — ABNORMAL LOW (ref 13.0–17.0)
Immature Granulocytes: 1 %
Lymphocytes Relative: 23 %
Lymphs Abs: 1.5 K/uL (ref 0.7–4.0)
MCH: 26.7 pg (ref 26.0–34.0)
MCHC: 32 g/dL (ref 30.0–36.0)
MCV: 83.5 fL (ref 80.0–100.0)
Monocytes Absolute: 0.7 K/uL (ref 0.1–1.0)
Monocytes Relative: 11 %
Neutro Abs: 4.1 K/uL (ref 1.7–7.7)
Neutrophils Relative %: 61 %
Platelets: 69 K/uL — ABNORMAL LOW (ref 150–400)
RBC: 3.22 MIL/uL — ABNORMAL LOW (ref 4.22–5.81)
RDW: 18.2 % — ABNORMAL HIGH (ref 11.5–15.5)
WBC: 6.6 K/uL (ref 4.0–10.5)
nRBC: 0 % (ref 0.0–0.2)

## 2023-12-18 LAB — TYPE AND SCREEN
ABO/RH(D): O NEG
Antibody Screen: NEGATIVE

## 2023-12-18 LAB — PROTIME-INR
INR: 1.2 (ref 0.8–1.2)
Prothrombin Time: 15.4 s — ABNORMAL HIGH (ref 11.4–15.2)

## 2023-12-18 MED ORDER — OXYCODONE HCL 5 MG PO TABS: 5.0000 mg | ORAL_TABLET | Freq: Three times a day (TID) | ORAL | 0 refills | Status: AC | PRN

## 2023-12-18 MED ORDER — TRANEXAMIC ACID-NACL 1000-0.7 MG/100ML-% IV SOLN
1000.0000 mg | Freq: Once | INTRAVENOUS | Status: DC
  Filled 2023-12-18: qty 100

## 2023-12-18 MED ORDER — SODIUM CHLORIDE 0.9 % IV BOLUS
1000.0000 mL | Freq: Once | INTRAVENOUS | Status: AC
  Administered 2023-12-18: 1000 mL via INTRAVENOUS

## 2023-12-18 MED ORDER — OXYCODONE HCL 5 MG PO TABS
5.0000 mg | ORAL_TABLET | Freq: Once | ORAL | Status: AC
  Administered 2023-12-18: 5 mg via ORAL
  Filled 2023-12-18: qty 1
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
## 2023-12-18 NOTE — ED Notes (Signed)
 Pt taken to CT.

## 2023-12-18 NOTE — ED Triage Notes (Signed)
 First nurse note: Pt to ED via [facility] from home. Pt reports right shoulder pain after fall. No head trauma. No LOC. No blood thinners. Swelling to right upper arm. 20g LFA  cbg 186 120/82 HR 60 RA 98%
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
## 2023-12-18 NOTE — ED Notes (Signed)
 EMTALA reviewed by this RN.

## 2023-12-18 NOTE — Discharge Instructions (Signed)
 Call and schedule follow-up appointment with orthopedics.  You may remove the sling for showering but otherwise keep it on.  When taking the pain medication, be aware it may make you dizzy or sleepy.  You should not drive or operate machinery for at least 8 hours after the last dose.

## 2023-12-18 NOTE — ED Notes (Signed)
 Report called to Lauraine PARAS, RN at [facility]. [facility] would like a call when pt is on the way to their facility at [phone] opt 2.
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
## 2023-12-18 NOTE — ED Notes (Signed)
 X-ray at bedside.
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
## 2023-12-19 LAB — CBG MONITORING, ED: Glucose-Capillary: 124 mg/dL — ABNORMAL HIGH (ref 70–99)

## 2023-12-19 NOTE — Telephone Encounter (Signed)
 Called patient about his most recent labs. His son answered the phone and patient was present on speaker phone. After his doctor reviewed his labs, he said Inform patient that kidney GFR came back improved at 31. Previously was 22 on August 30. Kidney function appears to be back to baseline. Stay hydrated. Continue to drink plenty of water . We will go over the rest of the reports when he comes back for follow-up visit.  No changes are needed at this time.Patient and son expressed understanding of labs given to him.

## 2023-12-20 ENCOUNTER — Other Ambulatory Visit: Payer: Self-pay | Admitting: Nephrology

## 2023-12-20 DIAGNOSIS — N179 Acute kidney failure, unspecified: Secondary | ICD-10-CM

## 2023-12-20 DIAGNOSIS — N1832 Chronic kidney disease, stage 3b: Secondary | ICD-10-CM

## 2023-12-20 DIAGNOSIS — R8290 Unspecified abnormal findings in urine: Secondary | ICD-10-CM

## 2023-12-20 DIAGNOSIS — D631 Anemia in chronic kidney disease: Secondary | ICD-10-CM

## 2023-12-20 DIAGNOSIS — E1122 Type 2 diabetes mellitus with diabetic chronic kidney disease: Secondary | ICD-10-CM

## 2024-03-16 ENCOUNTER — Other Ambulatory Visit: Payer: Self-pay | Admitting: Orthopedic Surgery

## 2024-03-16 DIAGNOSIS — M19211 Secondary osteoarthritis, right shoulder: Secondary | ICD-10-CM

## 2024-03-16 DIAGNOSIS — S42251A Displaced fracture of greater tuberosity of right humerus, initial encounter for closed fracture: Secondary | ICD-10-CM

## 2024-03-20 ENCOUNTER — Ambulatory Visit
